# Patient Record
Sex: Female | Born: 1980 | Race: White | Hispanic: No | Marital: Married | Smoking: Never smoker
Health system: Southern US, Community
[De-identification: ages and names within clinical notes are randomized; demographics above are authoritative.]

## PROBLEM LIST (undated history)

## (undated) ENCOUNTER — Inpatient Hospital Stay (HOSPITAL_COMMUNITY): Payer: Self-pay

## (undated) DIAGNOSIS — K449 Diaphragmatic hernia without obstruction or gangrene: Secondary | ICD-10-CM

## (undated) DIAGNOSIS — T7840XA Allergy, unspecified, initial encounter: Secondary | ICD-10-CM

## (undated) DIAGNOSIS — M419 Scoliosis, unspecified: Secondary | ICD-10-CM

## (undated) DIAGNOSIS — IMO0002 Reserved for concepts with insufficient information to code with codable children: Secondary | ICD-10-CM

## (undated) DIAGNOSIS — G43909 Migraine, unspecified, not intractable, without status migrainosus: Secondary | ICD-10-CM

## (undated) DIAGNOSIS — J329 Chronic sinusitis, unspecified: Secondary | ICD-10-CM

## (undated) DIAGNOSIS — J45909 Unspecified asthma, uncomplicated: Secondary | ICD-10-CM

## (undated) DIAGNOSIS — R112 Nausea with vomiting, unspecified: Secondary | ICD-10-CM

## (undated) DIAGNOSIS — K589 Irritable bowel syndrome without diarrhea: Secondary | ICD-10-CM

## (undated) DIAGNOSIS — Z9889 Other specified postprocedural states: Secondary | ICD-10-CM

## (undated) DIAGNOSIS — M1990 Unspecified osteoarthritis, unspecified site: Secondary | ICD-10-CM

## (undated) DIAGNOSIS — K219 Gastro-esophageal reflux disease without esophagitis: Secondary | ICD-10-CM

## (undated) DIAGNOSIS — M329 Systemic lupus erythematosus, unspecified: Secondary | ICD-10-CM

## (undated) DIAGNOSIS — R51 Headache: Secondary | ICD-10-CM

## (undated) DIAGNOSIS — Z9109 Other allergy status, other than to drugs and biological substances: Secondary | ICD-10-CM

## (undated) DIAGNOSIS — F419 Anxiety disorder, unspecified: Secondary | ICD-10-CM

## (undated) HISTORY — PX: TUBAL LIGATION: SHX77

## (undated) HISTORY — DX: Irritable bowel syndrome, unspecified: K58.9

## (undated) HISTORY — DX: Unspecified asthma, uncomplicated: J45.909

## (undated) HISTORY — DX: Diaphragmatic hernia without obstruction or gangrene: K44.9

## (undated) HISTORY — DX: Gastro-esophageal reflux disease without esophagitis: K21.9

## (undated) HISTORY — DX: Unspecified osteoarthritis, unspecified site: M19.90

## (undated) HISTORY — DX: Allergy, unspecified, initial encounter: T78.40XA

## (undated) HISTORY — PX: NASAL SINUS SURGERY: SHX719

## (undated) HISTORY — DX: Anxiety disorder, unspecified: F41.9

---
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
## 2000-09-17 ENCOUNTER — Ambulatory Visit (HOSPITAL_COMMUNITY): Admission: RE | Admit: 2000-09-17 | Discharge: 2000-09-17 | Payer: Self-pay | Admitting: Internal Medicine

## 2001-02-19 ENCOUNTER — Other Ambulatory Visit: Admission: RE | Admit: 2001-02-19 | Discharge: 2001-02-19 | Payer: Self-pay | Admitting: Obstetrics and Gynecology

## 2002-01-01 ENCOUNTER — Emergency Department (HOSPITAL_COMMUNITY): Admission: EM | Admit: 2002-01-01 | Discharge: 2002-01-01 | Payer: Self-pay | Admitting: Emergency Medicine

## 2002-01-02 ENCOUNTER — Encounter: Payer: Self-pay | Admitting: Emergency Medicine

## 2002-04-28 ENCOUNTER — Ambulatory Visit (HOSPITAL_COMMUNITY): Admission: RE | Admit: 2002-04-28 | Discharge: 2002-04-28 | Payer: Self-pay | Admitting: Pulmonary Disease

## 2004-01-09 HISTORY — PX: NASAL SEPTUM SURGERY: SHX37

## 2004-08-29 ENCOUNTER — Emergency Department (HOSPITAL_COMMUNITY): Admission: EM | Admit: 2004-08-29 | Discharge: 2004-08-29 | Payer: Self-pay | Admitting: Family Medicine

## 2004-09-07 ENCOUNTER — Ambulatory Visit (HOSPITAL_COMMUNITY): Admission: RE | Admit: 2004-09-07 | Discharge: 2004-09-07 | Payer: Self-pay | Admitting: Pulmonary Disease

## 2007-03-24 ENCOUNTER — Ambulatory Visit (HOSPITAL_COMMUNITY): Admission: RE | Admit: 2007-03-24 | Discharge: 2007-03-24 | Payer: Self-pay | Admitting: Pulmonary Disease

## 2007-05-15 ENCOUNTER — Ambulatory Visit (HOSPITAL_COMMUNITY): Admission: RE | Admit: 2007-05-15 | Discharge: 2007-05-15 | Payer: Self-pay | Admitting: Pulmonary Disease

## 2007-05-27 ENCOUNTER — Ambulatory Visit (HOSPITAL_COMMUNITY): Admission: RE | Admit: 2007-05-27 | Discharge: 2007-05-27 | Payer: Self-pay | Admitting: Pulmonary Disease

## 2007-09-08 ENCOUNTER — Other Ambulatory Visit: Admission: RE | Admit: 2007-09-08 | Discharge: 2007-09-08 | Payer: Self-pay | Admitting: Obstetrics and Gynecology

## 2008-02-06 ENCOUNTER — Ambulatory Visit: Payer: Self-pay | Admitting: Gastroenterology

## 2008-02-06 DIAGNOSIS — R1013 Epigastric pain: Secondary | ICD-10-CM | POA: Insufficient documentation

## 2008-02-06 DIAGNOSIS — K589 Irritable bowel syndrome without diarrhea: Secondary | ICD-10-CM | POA: Insufficient documentation

## 2008-02-06 DIAGNOSIS — M129 Arthropathy, unspecified: Secondary | ICD-10-CM | POA: Insufficient documentation

## 2008-02-06 DIAGNOSIS — R1033 Periumbilical pain: Secondary | ICD-10-CM | POA: Insufficient documentation

## 2008-02-16 ENCOUNTER — Ambulatory Visit: Payer: Self-pay | Admitting: Gastroenterology

## 2008-02-16 DIAGNOSIS — K219 Gastro-esophageal reflux disease without esophagitis: Secondary | ICD-10-CM | POA: Insufficient documentation

## 2008-02-16 LAB — CONVERTED CEMR LAB: UREASE: NEGATIVE

## 2008-03-18 DIAGNOSIS — K219 Gastro-esophageal reflux disease without esophagitis: Secondary | ICD-10-CM | POA: Insufficient documentation

## 2008-03-18 DIAGNOSIS — K209 Esophagitis, unspecified without bleeding: Secondary | ICD-10-CM | POA: Insufficient documentation

## 2008-03-18 DIAGNOSIS — K449 Diaphragmatic hernia without obstruction or gangrene: Secondary | ICD-10-CM | POA: Insufficient documentation

## 2008-03-18 HISTORY — DX: Esophagitis, unspecified without bleeding: K20.90

## 2008-03-19 ENCOUNTER — Ambulatory Visit: Payer: Self-pay | Admitting: Gastroenterology

## 2008-03-19 LAB — CONVERTED CEMR LAB
AST: 19 units/L (ref 0–37)
Albumin: 3.8 g/dL (ref 3.5–5.2)
Alkaline Phosphatase: 51 units/L (ref 39–117)
BUN: 9 mg/dL (ref 6–23)
Basophils Relative: 0.1 % (ref 0.0–3.0)
Bilirubin, Direct: 0.2 mg/dL (ref 0.0–0.3)
Chloride: 106 meq/L (ref 96–112)
Eosinophils Relative: 7 % — ABNORMAL HIGH (ref 0.0–5.0)
Ferritin: 20.6 ng/mL (ref 10.0–291.0)
Folate: 14.2 ng/mL
GFR calc Af Amer: 153 mL/min
GFR calc non Af Amer: 127 mL/min
HCT: 40.8 % (ref 36.0–46.0)
Hemoglobin: 14.4 g/dL (ref 12.0–15.0)
MCV: 86.3 fL (ref 78.0–100.0)
Monocytes Relative: 5.6 % (ref 3.0–12.0)
Platelets: 223 10*3/uL (ref 150–400)
Potassium: 4.1 meq/L (ref 3.5–5.1)
Saturation Ratios: 31 % (ref 20.0–50.0)
Sed Rate: 9 mm/hr (ref 0–22)
Sodium: 140 meq/L (ref 135–145)
TSH: 1.8 microintl units/mL (ref 0.35–5.50)
Total Bilirubin: 0.9 mg/dL (ref 0.3–1.2)
WBC: 7 10*3/uL (ref 4.5–10.5)

## 2008-05-14 ENCOUNTER — Ambulatory Visit: Payer: Self-pay | Admitting: Gastroenterology

## 2009-03-14 ENCOUNTER — Inpatient Hospital Stay (HOSPITAL_COMMUNITY): Admission: AD | Admit: 2009-03-14 | Discharge: 2009-03-17 | Payer: Self-pay | Admitting: Obstetrics

## 2009-10-05 ENCOUNTER — Emergency Department (HOSPITAL_COMMUNITY): Admission: EM | Admit: 2009-10-05 | Discharge: 2009-10-05 | Payer: Self-pay | Admitting: Emergency Medicine

## 2009-12-27 ENCOUNTER — Ambulatory Visit: Payer: Self-pay | Admitting: Gastroenterology

## 2010-01-11 ENCOUNTER — Telehealth: Payer: Self-pay | Admitting: Gastroenterology

## 2010-01-12 ENCOUNTER — Ambulatory Visit (HOSPITAL_COMMUNITY)
Admission: RE | Admit: 2010-01-12 | Discharge: 2010-01-12 | Payer: Self-pay | Source: Home / Self Care | Attending: Gastroenterology | Admitting: Gastroenterology

## 2010-01-12 LAB — CONVERTED CEMR LAB
BUN: 7 mg/dL (ref 6–23)
Basophils Absolute: 0 10*3/uL (ref 0.0–0.1)
Basophils Relative: 0.6 % (ref 0.0–3.0)
Bilirubin, Direct: 0.1 mg/dL (ref 0.0–0.3)
Creatinine, Ser: 0.7 mg/dL (ref 0.4–1.2)
Eosinophils Absolute: 0.4 10*3/uL (ref 0.0–0.7)
GFR calc non Af Amer: 104 mL/min (ref >60.00)
Iron: 141 ug/dL (ref 42–145)
Lymphocytes Relative: 34.9 % (ref 12.0–46.0)
MCHC: 35.1 g/dL (ref 30.0–36.0)
MCV: 85.2 fL (ref 78.0–100.0)
Monocytes Absolute: 0.6 10*3/uL (ref 0.1–1.0)
Neutro Abs: 3.4 10*3/uL (ref 1.4–7.7)
Neutrophils Relative %: 49.6 % (ref 43.0–77.0)
Potassium: 3.9 meq/L (ref 3.5–5.1)
RBC: 4.47 M/uL (ref 3.87–5.11)
RDW: 13.1 % (ref 11.5–14.6)
Saturation Ratios: 33.1 % (ref 20.0–50.0)
TSH: 1.96 microintl units/mL (ref 0.35–5.50)
Total Bilirubin: 0.5 mg/dL (ref 0.3–1.2)
Transferrin: 304.2 mg/dL (ref 212.0–360.0)
Vitamin B-12: 278 pg/mL (ref 211–911)

## 2010-01-19 ENCOUNTER — Ambulatory Visit: Admit: 2010-01-19 | Payer: Self-pay | Admitting: Gastroenterology

## 2010-02-09 NOTE — Progress Notes (Signed)
Summary: Questions   Phone Note Call from Patient Call back at Home Phone [phone] Call back at [phone]   Caller: Patient Call For: Dr. Jarold Motto Reason for Call: Talk to Nurse Summary of Call: has questions about her procedure at the hospital tomorrow Initial call taken by: [place] Johnson,  January 11, 2010 2:00 PM  Follow-up for Phone Call        Patient is having a GES in am and has had a cold. Patient wanted to know if she can drink water and have cough drops. Informed patient she must be NPO 8hr prior to test and she shouldn't stimulate her stomach with anything until she gets to Nuclear [facility]. She will be given eggs and toast and her stomach will be checked at 1hr and 2 hrs for emptying. She should hold all her stomach meds for 8hr also. Patient stated she may r/s heself if she doesn't feel better. Follow-up by: Graciella Freer RN,  January 11, 2010 2:43 PM

## 2010-02-09 NOTE — Assessment & Plan Note (Signed)
Summary: ACIPHEX NOT HELPING GERD/HERNIA...AS.   History of Present Illness Visit Type: Follow-up Visit Primary GI MD: Sheryn Bison MD FACP FAGA Primary Provider: Kari Baars, MD Requesting Provider: n/a Chief Complaint: Aciphex not helping with GERD, also IBS has wosrened History of Present Illness:   Emma Franco had a normal baby girl 9 months ago. She is not breast-feeding at this time. She continues with acid reflux symptoms despite twice a day AcipHex. She also has early satiety, gas, bloating, and IBS-type complaints partially relieved by Levsin. Review of her chart shows previously borderline positive celiac serologies. Previous exam for H. pylori was negative. She is status post cholecystectomy. She denies abuse of NSAIDs, alcohol, or cigarettes. There is no associated anorexia, weight loss, dysphagia, or hepatobiliary symptomatology.   GI Review of Systems    Reports acid reflux, belching, bloating, heartburn, and  nausea.      Denies abdominal pain, chest pain, dysphagia with liquids, dysphagia with solids, loss of appetite, vomiting, vomiting blood, weight loss, and  weight gain.      Reports change in bowel habits, constipation, diarrhea, and  irritable bowel syndrome.     Denies anal fissure, black tarry stools, diverticulosis, fecal incontinence, heme positive stool, hemorrhoids, jaundice, light color stool, liver problems, rectal bleeding, and  rectal pain.    Current Medications (verified): 1)  Aciphex 20 Mg  Tbec (Rabeprazole Sodium) .... Take One By Mouth Two Times A Day 2)  Levsin 0.125 Mg Tabs (Hyoscyamine Sulfate) .... One Tablet By Mouth Four Times A Day As Needed 3)  Zyrtec Allergy 10 Mg Tabs (Cetirizine Hcl) .Marland Kitchen.. 1 By Mouth Once Daily 4)  Sprintec 28 0.25-35 Mg-Mcg Tabs (Norgestimate-Eth Estradiol) .Marland Kitchen.. 1 By Mouth Once Daily  Allergies (verified): 1)  Cipro  Past History:  Past medical, surgical, family and social histories (including risk factors) reviewed for  relevance to current acute and chronic problems.  Past Medical History: Reviewed history from 03/18/2008 and no changes required. Current Problems:  ESOPHAGITIS (ICD-530.10) HIATAL HERNIA (ICD-553.3) GERD (ICD-530.81) ABDOMINAL PAIN-EPIGASTRIC (ICD-789.06) IRRITABLE BOWEL SYNDROME (ICD-564.1) ARTHRITIS (ICD-716.90)  Past Surgical History: Cholecystectomy Deviated Septum Surgery C-Section 3 /2011  Family History: Reviewed history from 05/14/2008 and no changes required. No FH of Colon Cancer: CVA on both maternal and Paternal  Social History: Reviewed history from 02/06/2008 and no changes required. 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  Alcohol Use - no Daily Caffeine Use Illicit Drug Use - no  Review of Systems       The patient complains of allergy/sinus.  The patient denies anemia, anxiety-new, arthritis/joint pain, back pain, blood in urine, breast changes/lumps, change in vision, confusion, cough, coughing up blood, depression-new, fainting, fatigue, fever, headaches-new, hearing problems, heart murmur, heart rhythm changes, itching, menstrual pain, muscle pains/cramps, night sweats, nosebleeds, pregnancy symptoms, shortness of breath, skin rash, sleeping problems, sore throat, swelling of feet/legs, swollen lymph glands, thirst - excessive , urination - excessive , urination changes/pain, urine leakage, vision changes, and voice change.    Vital Signs:  Patient profile:   30 year old female Height:      66 inches Weight:      178 pounds BMI:     28.83 BSA:     1.91 Pulse rate:   82 / minute Pulse rhythm:   regular BP sitting:   110 / 80  (left arm)  Vitals Entered By: Merri Ray CMA Duncan Dull) (December 27, 2009 11:35 AM)  Physical Exam  General:  Well developed,  well nourished, no acute distress.healthy appearing.   Head:  Normocephalic and atraumatic. Eyes:  PERRLA, no icterus.exam deferred to patient's ophthalmologist.     Lungs:  Clear throughout to auscultation. Heart:  Regular rate and rhythm; no murmurs, rubs,  or bruits. Abdomen:  Soft, nontender and nondistended. No masses, hepatosplenomegaly or hernias noted. Normal bowel sounds. Extremities:  No clubbing, cyanosis, edema or deformities noted. Psych:  Alert and cooperative. Normal mood and affect.   Impression & Recommendations:  Problem # 1:  GERD (ICD-530.81) Assessment Deteriorated problemprobable associated gastroparesis. I have scheduled technetium gastric emptying scan, and she may benefit from domperidone therapy. For now, she is to continue AcipHex twice a day with p.r.n. Levsin. Orders: TLB-CBC Platelet - w/Differential (85025-CBCD) TLB-BMP (Basic Metabolic Panel-BMET) (80048-METABOL) TLB-Hepatic/Liver Function Pnl (80076-HEPATIC) TLB-TSH (Thyroid Stimulating Hormone) (84443-TSH) TLB-B12, Serum-Total ONLY (16109-U04) TLB-Folic Acid (Folate) (82746-FOL) TLB-IBC Pnl (Iron/FE;Transferrin) (83550-IBC) TLB-Ferritin (82728-FER) TLB-Sedimentation Rate (ESR) (85652-ESR) Gastric Emptying Scan (GES)  Problem # 2:  IRRITABLE BOWEL SYNDROME (ICD-564.1) Assessment: Deteriorated Repeat labs and celiac serologies were trial of gluten-free diet.  Patient Instructions: 1)  Your physician requests that you go to the basement floor of our office to have the following labwork completed before leaving today: metabolic panel, hepatic panel, CBC, TSH, IBC, Ferritin, B12 level, Folic Acid, Sedimentation Rate, Sprue Profile. 2)  You have been scheduled for a gastric emptying scan on 01/12/10 @ 8 am [facility]. Please arrive at 7:45 am for registration. 3)  Please pick up your prescriptions at the pharmacy. Electronic prescription(s) has already been sent for Aciphex. 4)  Please continue your Levsin prescription. 5)  The medication list was reviewed and reconciled.  All changed / newly prescribed medications were explained.  A complete medication list  was provided to the patient / caregiver. 6)  Please schedule a follow-up appointment in 1 month.  7)  Copy sent to : Dr. Kari Baars. Prescriptions: ACIPHEX 20 MG  TBEC (RABEPRAZOLE SODIUM) take one by mouth two times a day  #60 x 3   Entered by:   Lamona Curl CMA (AAMA)   Authorized by:   Mardella Layman MD [facility]   Signed by:   Lamona Curl CMA (AAMA) on 12/27/2009   Method used:   Electronically to        [facility]  [address] [phone][facility] (retail)       [address]       [place], [place]  [address]       Ph: [phone]       Fax: [phone]   RxID:   [id number]   Appended Document: ACIPHEX NOT HELPING GERD/HERNIA...AS.    Clinical Lists Changes  Medications: Rx of LEVSIN 0.125 MG TABS (HYOSCYAMINE SULFATE) one tablet by mouth four times a day as needed;  #100 x 3;  Signed;  Entered by: Lamona Curl CMA (AAMA);  Authorized by: Mardella Layman MD [facility];  Method used: [facility]  [id number] [address] [place], [place], [place]  [address], Ph: [phone], Fax: [phone]    Prescriptions: LEVSIN 0.125 MG TABS (HYOSCYAMINE SULFATE) one tablet by mouth four times a day as needed  #100 x 3   Entered by:   Lamona Curl CMA (AAMA)   Authorized by:   Mardella Layman MD [facility]   Signed by:   Lamona Curl CMA (AAMA) on 12/27/2009   Method used:   [facility]  [address] [id number] ([facility]ail)       [address]       [place], [place]  [address]       Ph: [phone]       Fax: [phone]   RxID:   [id number]

## 2010-04-03 LAB — CBC
HCT: 33.7 % — ABNORMAL LOW (ref 36.0–46.0)
Hemoglobin: 8.8 g/dL — ABNORMAL LOW (ref 12.0–15.0)
MCHC: 33.6 g/dL (ref 30.0–36.0)
MCHC: 33.8 g/dL (ref 30.0–36.0)
MCV: 87.2 fL (ref 78.0–100.0)
Platelets: 125 10*3/uL — ABNORMAL LOW (ref 150–400)
Platelets: 180 10*3/uL (ref 150–400)
RDW: 13.5 % (ref 11.5–15.5)
RDW: 13.9 % (ref 11.5–15.5)

## 2010-04-03 LAB — BASIC METABOLIC PANEL
BUN: 4 mg/dL — ABNORMAL LOW (ref 6–23)
Calcium: 7.5 mg/dL — ABNORMAL LOW (ref 8.4–10.5)
Creatinine, Ser: 0.45 mg/dL (ref 0.4–1.2)
GFR calc non Af Amer: >60 mL/min (ref >60)
Glucose, Bld: 118 mg/dL — ABNORMAL HIGH (ref 70–99)

## 2010-07-10 ENCOUNTER — Telehealth: Payer: Self-pay | Admitting: *Deleted

## 2010-07-10 DIAGNOSIS — E538 Deficiency of other specified B group vitamins: Secondary | ICD-10-CM

## 2010-07-10 NOTE — Telephone Encounter (Signed)
Left message on patients machine to call back and give date she can come for b12 level. I have not signed lab order waiting on date to sign.

## 2010-07-10 NOTE — Telephone Encounter (Signed)
Message copied by Leonette Monarch on Mon Jul 10, 2010  3:33 PM ------      Message from: Harlow Mares D      Created: Wed Mar 15, 2010  9:53 AM       Repeat b12 level

## 2010-07-14 NOTE — Telephone Encounter (Signed)
Left another message for patient to call back regarding B12 level.

## 2010-07-17 ENCOUNTER — Other Ambulatory Visit (INDEPENDENT_AMBULATORY_CARE_PROVIDER_SITE_OTHER): Payer: Self-pay

## 2010-07-17 DIAGNOSIS — E538 Deficiency of other specified B group vitamins: Secondary | ICD-10-CM

## 2010-07-17 LAB — VITAMIN B12: Vitamin B-12: 393 pg/mL (ref 211–911)

## 2010-07-17 NOTE — Telephone Encounter (Signed)
Pt aware and she will come today. Order in.

## 2010-08-01 ENCOUNTER — Telehealth: Payer: Self-pay | Admitting: Gastroenterology

## 2010-08-01 NOTE — Telephone Encounter (Signed)
Advised labs are normal

## 2010-08-25 ENCOUNTER — Other Ambulatory Visit: Payer: Self-pay | Admitting: Gastroenterology

## 2011-03-28 ENCOUNTER — Other Ambulatory Visit: Payer: Self-pay | Admitting: Gastroenterology

## 2011-03-28 MED ORDER — RABEPRAZOLE SODIUM 20 MG PO TBEC: 20.0000 mg | DELAYED_RELEASE_TABLET | Freq: Two times a day (BID) | ORAL | Status: DC

## 2011-03-28 NOTE — Telephone Encounter (Signed)
lmom that I ordered 1 month's script of Aciphex.

## 2011-04-17 ENCOUNTER — Other Ambulatory Visit (INDEPENDENT_AMBULATORY_CARE_PROVIDER_SITE_OTHER): Payer: 59

## 2011-04-17 ENCOUNTER — Encounter: Payer: Self-pay | Admitting: Gastroenterology

## 2011-04-17 ENCOUNTER — Ambulatory Visit (INDEPENDENT_AMBULATORY_CARE_PROVIDER_SITE_OTHER): Payer: 59 | Admitting: Gastroenterology

## 2011-04-17 VITALS — BP 112/84 | HR 96 | Ht 66.0 in | Wt 158.1 lb

## 2011-04-17 DIAGNOSIS — K649 Unspecified hemorrhoids: Secondary | ICD-10-CM

## 2011-04-17 DIAGNOSIS — K219 Gastro-esophageal reflux disease without esophagitis: Secondary | ICD-10-CM

## 2011-04-17 DIAGNOSIS — Z8719 Personal history of other diseases of the digestive system: Secondary | ICD-10-CM

## 2011-04-17 DIAGNOSIS — E538 Deficiency of other specified B group vitamins: Secondary | ICD-10-CM

## 2011-04-17 LAB — CBC WITH DIFFERENTIAL/PLATELET
Eosinophils Absolute: 0.7 10*3/uL (ref 0.0–0.7)
Eosinophils Relative: 10.6 % — ABNORMAL HIGH (ref 0.0–5.0)
MCV: 84.5 fl (ref 78.0–100.0)
Monocytes Absolute: 0.8 10*3/uL (ref 0.1–1.0)
Neutrophils Relative %: 46 % (ref 43.0–77.0)
Platelets: 207 10*3/uL (ref 150.0–400.0)
WBC: 7 10*3/uL (ref 4.5–10.5)

## 2011-04-17 LAB — FOLATE

## 2011-04-17 LAB — IBC PANEL: Transferrin: 255.5 mg/dL (ref 212.0–360.0)

## 2011-04-17 LAB — FERRITIN: Ferritin: 27.4 ng/mL (ref 10.0–291.0)

## 2011-04-17 MED ORDER — RABEPRAZOLE SODIUM 20 MG PO TBEC: 20.0000 mg | DELAYED_RELEASE_TABLET | Freq: Two times a day (BID) | ORAL | Status: DC

## 2011-04-17 MED ORDER — PRAMOXINE-HC 1-1 % EX CREA: TOPICAL_CREAM | CUTANEOUS | Status: DC

## 2011-04-17 NOTE — Patient Instructions (Signed)
Please go to the basement today for your labs.  Your prescription(s) have been sent to you pharmacy.   

## 2011-04-17 NOTE — Progress Notes (Signed)
This is a very pleasant 31 year old Caucasian female with chronic acid reflux well controlled with AcipHex 20 mg a day. She currently is asymptomatic and denies chest pain, dysphagia, hepatobiliary or lower gastrointestinal problems. She is status post cholecystectomy. She has a child now age 68, and has had external hemorrhoidal difficulties since her delivery. However, she denies rectal bleeding, history of anemia, but does have a borderline B12 level. Her appetite is good and her weight is stable. She does suffer from allergic sinusitis.  Current Medications, Allergies, Past Medical History, Past Surgical History, Family History and Social History were reviewed in [employer] record.  Pertinent Review of Systems Negative... no history of Raynaud's phenomena more collagen vascular disease. She is currently on birth control pills. She has mild IBS with relieved by when necessary sublingual Levsin.   Physical Exam: Healthy appearing patient appeared her stated age. Blood pressure 112/84 pulse 96 and regular. BMI 25.52 . I cannot appreciate stigmata of chronic liver disease. Abdominal exam shows no organomegaly, masses, tenderness, or abnormal bowel sounds. Mental status is normal.    Assessment and Plan: Chronic GERD doing well on daily PPI therapy. I have renewed her medications and also prescribed when necessary Analpram cream for her hemorrhoidal problems. We did repeat her CBC and anemia profile today. I did review chronic antireflux maneuvers with the patient. We will see her on when necessary basis as needed. We discussed also surgical options with fundoplication for chronic GERD, but I do not think that is necessary at this time. She is to continue medical followup with Dr. Juanetta Gosling as planned. Encounter Diagnosis  Name Primary?  . Esophageal reflux Yes

## 2011-07-10 ENCOUNTER — Other Ambulatory Visit (HOSPITAL_COMMUNITY): Payer: Self-pay | Admitting: Otolaryngology

## 2011-07-11 ENCOUNTER — Encounter (HOSPITAL_COMMUNITY): Payer: Self-pay | Admitting: Pharmacy Technician

## 2011-07-16 ENCOUNTER — Encounter (HOSPITAL_COMMUNITY): Payer: Self-pay

## 2011-07-16 ENCOUNTER — Encounter (HOSPITAL_COMMUNITY)
Admission: RE | Admit: 2011-07-16 | Discharge: 2011-07-16 | Disposition: A | Discharge status: Home / Self Care | Payer: 59 | Source: Ambulatory Visit | Attending: Otolaryngology | Admitting: Otolaryngology

## 2011-07-16 HISTORY — DX: Other specified postprocedural states: Z98.890

## 2011-07-16 HISTORY — DX: Nausea with vomiting, unspecified: R11.2

## 2011-07-16 HISTORY — DX: Other allergy status, other than to drugs and biological substances: Z91.09

## 2011-07-16 HISTORY — DX: Chronic sinusitis, unspecified: J32.9

## 2011-07-16 HISTORY — DX: Headache: R51

## 2011-07-16 LAB — CBC
HCT: 38.1 % (ref 36.0–46.0)
Hemoglobin: 13 g/dL (ref 12.0–15.0)
MCH: 28.4 pg (ref 26.0–34.0)
MCHC: 34.1 g/dL (ref 30.0–36.0)
MCV: 83.2 fL (ref 78.0–100.0)
RBC: 4.58 MIL/uL (ref 3.87–5.11)

## 2011-07-16 NOTE — Pre-Procedure Instructions (Signed)
[name]  07/16/2011   Your procedure is scheduled on:  Wednesday July 18, 2011.  Report to [facility] at 1015 AM.  Call this number if you have problems the morning of surgery: [phone]   Remember:   Do not eat food or drink:After Midnight.    Take these medicines the morning of surgery with A SIP OF WATER: Cetirizine (Zyrtec), Sprintec, and Rabeprazole (Aciphex).   Do not wear jewelry, make-up or nail polish.  Do not wear lotions, powders, or perfumes.   Do not shave 48 hours prior to surgery.   Do not bring valuables to the hospital.  Contacts, dentures or bridgework may not be worn into surgery.  Leave suitcase in the car. After surgery it may be brought to your room.  For patients admitted to the hospital, checkout time is 11:00 AM the day of discharge.   Patients discharged the day of surgery will not be allowed to drive home.  Name and phone number of your driver:   Special Instructions: CHG Shower Use Special Wash: 1/2 bottle night before surgery and 1/2 bottle morning of surgery.   Please read over the following fact sheets that you were given: Pain Booklet, Coughing and Deep Breathing, MRSA Information and Surgical Site Infection Prevention

## 2011-07-18 ENCOUNTER — Ambulatory Visit (HOSPITAL_COMMUNITY): Payer: 59 | Admitting: Anesthesiology

## 2011-07-18 ENCOUNTER — Encounter (HOSPITAL_COMMUNITY): Payer: Self-pay | Admitting: Anesthesiology

## 2011-07-18 ENCOUNTER — Observation Stay (HOSPITAL_COMMUNITY)
Admission: RE | Admit: 2011-07-18 | Discharge: 2011-07-19 | Disposition: A | Discharge status: Home / Self Care | Payer: 59 | Source: Ambulatory Visit | Attending: Otolaryngology | Admitting: Otolaryngology

## 2011-07-18 ENCOUNTER — Encounter (HOSPITAL_COMMUNITY)
Admission: RE | Disposition: A | Discharge status: Home / Self Care | Payer: Self-pay | Source: Ambulatory Visit | Attending: Otolaryngology

## 2011-07-18 ENCOUNTER — Encounter (HOSPITAL_COMMUNITY): Payer: Self-pay | Admitting: General Practice

## 2011-07-18 ENCOUNTER — Encounter (HOSPITAL_COMMUNITY): Payer: Self-pay | Admitting: *Deleted

## 2011-07-18 DIAGNOSIS — Z23 Encounter for immunization: Secondary | ICD-10-CM | POA: Insufficient documentation

## 2011-07-18 DIAGNOSIS — J322 Chronic ethmoidal sinusitis: Principal | ICD-10-CM | POA: Insufficient documentation

## 2011-07-18 DIAGNOSIS — D140 Benign neoplasm of middle ear, nasal cavity and accessory sinuses: Secondary | ICD-10-CM | POA: Insufficient documentation

## 2011-07-18 DIAGNOSIS — K219 Gastro-esophageal reflux disease without esophagitis: Secondary | ICD-10-CM | POA: Insufficient documentation

## 2011-07-18 DIAGNOSIS — J320 Chronic maxillary sinusitis: Secondary | ICD-10-CM | POA: Insufficient documentation

## 2011-07-18 DIAGNOSIS — Z01812 Encounter for preprocedural laboratory examination: Secondary | ICD-10-CM | POA: Insufficient documentation

## 2011-07-18 DIAGNOSIS — J323 Chronic sphenoidal sinusitis: Secondary | ICD-10-CM | POA: Insufficient documentation

## 2011-07-18 DIAGNOSIS — R51 Headache: Secondary | ICD-10-CM

## 2011-07-18 DIAGNOSIS — J321 Chronic frontal sinusitis: Secondary | ICD-10-CM | POA: Insufficient documentation

## 2011-07-18 DIAGNOSIS — G9601 Cranial cerebrospinal fluid leak, spontaneous: Secondary | ICD-10-CM | POA: Insufficient documentation

## 2011-07-18 HISTORY — DX: Migraine, unspecified, not intractable, without status migrainosus: G43.909

## 2011-07-18 HISTORY — PX: SINUS ENDO W/FUSION: SHX777

## 2011-07-18 HISTORY — DX: Headache: R51

## 2011-07-18 HISTORY — PX: REPAIR DURAL / CSF LEAK: SUR1169

## 2011-07-18 SURGERY — SINUS SURGERY, ENDOSCOPIC, USING COMPUTER-ASSISTED NAVIGATION
Anesthesia: General | Site: Nose | Wound class: Clean Contaminated

## 2011-07-18 MED ORDER — HYDROMORPHONE HCL PF 1 MG/ML IJ SOLN
INTRAMUSCULAR | Status: AC
  Filled 2011-07-18: qty 1

## 2011-07-18 MED ORDER — PNEUMOCOCCAL VAC POLYVALENT 25 MCG/0.5ML IJ INJ
0.5000 mL | INJECTION | INTRAMUSCULAR | Status: AC
  Administered 2011-07-19: 0.5 mL via INTRAMUSCULAR
  Filled 2011-07-18: qty 0.5

## 2011-07-18 MED ORDER — HYDROCODONE-ACETAMINOPHEN 5-325 MG PO TABS
1.0000 | ORAL_TABLET | ORAL | Status: DC | PRN
  Administered 2011-07-19: 2 via ORAL
  Filled 2011-07-18 (×2): qty 2

## 2011-07-18 MED ORDER — LIDOCAINE-EPINEPHRINE 1 %-1:100000 IJ SOLN
INTRAMUSCULAR | Status: DC | PRN
  Administered 2011-07-18: 3.5 mL

## 2011-07-18 MED ORDER — DEXAMETHASONE SODIUM PHOSPHATE 4 MG/ML IJ SOLN
INTRAMUSCULAR | Status: DC | PRN
  Administered 2011-07-18: 10 mg via INTRAVENOUS

## 2011-07-18 MED ORDER — BUTALBITAL-APAP-CAFFEINE 50-325-40 MG PO TABS
1.0000 | ORAL_TABLET | Freq: Four times a day (QID) | ORAL | Status: DC | PRN
  Administered 2011-07-18 – 2011-07-19 (×4): 1 via ORAL
  Filled 2011-07-18 (×4): qty 1

## 2011-07-18 MED ORDER — MUPIROCIN 2 % EX OINT
TOPICAL_OINTMENT | CUTANEOUS | Status: AC
  Filled 2011-07-18: qty 22

## 2011-07-18 MED ORDER — CLINDAMYCIN PHOSPHATE 600 MG/50ML IV SOLN: 600.0000 mg | Freq: Once | INTRAVENOUS | Status: DC

## 2011-07-18 MED ORDER — METRONIDAZOLE 500 MG PO TABS
500.0000 mg | ORAL_TABLET | Freq: Three times a day (TID) | ORAL | Status: DC
  Administered 2011-07-18 – 2011-07-19 (×2): 500 mg via ORAL
  Filled 2011-07-18 (×5): qty 1

## 2011-07-18 MED ORDER — CLINDAMYCIN PHOSPHATE 900 MG/50ML IV SOLN
900.0000 mg | INTRAVENOUS | Status: DC
  Filled 2011-07-18: qty 50

## 2011-07-18 MED ORDER — LIDOCAINE-EPINEPHRINE 1 %-1:100000 IJ SOLN
INTRAMUSCULAR | Status: AC
  Filled 2011-07-18: qty 1

## 2011-07-18 MED ORDER — NORGESTIMATE-ETH ESTRADIOL 0.25-35 MG-MCG PO TABS: 1.0000 | ORAL_TABLET | Freq: Every day | ORAL | Status: DC

## 2011-07-18 MED ORDER — GLYCOPYRROLATE 0.2 MG/ML IJ SOLN
INTRAMUSCULAR | Status: DC | PRN
  Administered 2011-07-18: 0.4 mg via INTRAVENOUS

## 2011-07-18 MED ORDER — ONDANSETRON HCL 4 MG/2ML IJ SOLN
INTRAMUSCULAR | Status: DC | PRN
  Administered 2011-07-18: 4 mg via INTRAVENOUS

## 2011-07-18 MED ORDER — MENINGOCOCCAL VAC A,C,Y,W-135 ~~LOC~~ INJ: 0.5000 mL | INJECTION | Freq: Once | SUBCUTANEOUS | Status: DC

## 2011-07-18 MED ORDER — KCL IN DEXTROSE-NACL 10-5-0.45 MEQ/L-%-% IV SOLN
INTRAVENOUS | Status: DC
  Administered 2011-07-18: 75 mL via INTRAVENOUS
  Filled 2011-07-18 (×3): qty 1000

## 2011-07-18 MED ORDER — LIDOCAINE HCL (CARDIAC) 20 MG/ML IV SOLN
INTRAVENOUS | Status: DC | PRN
  Administered 2011-07-18: 60 mg via INTRAVENOUS

## 2011-07-18 MED ORDER — LACTATED RINGERS IV SOLN
INTRAVENOUS | Status: DC
  Administered 2011-07-18: 11:00:00 via INTRAVENOUS

## 2011-07-18 MED ORDER — MORPHINE SULFATE 2 MG/ML IJ SOLN
1.0000 mg | INTRAMUSCULAR | Status: DC | PRN
  Administered 2011-07-18 – 2011-07-19 (×2): 1 mg via INTRAVENOUS
  Filled 2011-07-18 (×2): qty 1

## 2011-07-18 MED ORDER — PANTOPRAZOLE SODIUM 40 MG PO TBEC
40.0000 mg | DELAYED_RELEASE_TABLET | Freq: Every day | ORAL | Status: DC
  Administered 2011-07-19: 40 mg via ORAL
  Filled 2011-07-18: qty 1

## 2011-07-18 MED ORDER — ACETAMINOPHEN 80 MG PO CHEW: 320.0000 mg | CHEWABLE_TABLET | ORAL | Status: DC | PRN

## 2011-07-18 MED ORDER — LACTATED RINGERS IV SOLN
INTRAVENOUS | Status: DC | PRN
  Administered 2011-07-18 (×3): via INTRAVENOUS

## 2011-07-18 MED ORDER — CLINDAMYCIN PHOSPHATE 600 MG/50ML IV SOLN
INTRAVENOUS | Status: DC | PRN
  Administered 2011-07-18: 600 mg via INTRAVENOUS

## 2011-07-18 MED ORDER — DOCUSATE SODIUM 100 MG PO CAPS
100.0000 mg | ORAL_CAPSULE | Freq: Two times a day (BID) | ORAL | Status: DC | PRN
Start: 2011-07-18 — End: 2011-07-19

## 2011-07-18 MED ORDER — MICROFIBRILLAR COLL HEMOSTAT EX PADS
MEDICATED_PAD | CUTANEOUS | Status: DC | PRN
  Administered 2011-07-18: 1 via TOPICAL

## 2011-07-18 MED ORDER — DROPERIDOL 2.5 MG/ML IJ SOLN
INTRAMUSCULAR | Status: AC
  Filled 2011-07-18: qty 2

## 2011-07-18 MED ORDER — MIDAZOLAM HCL 5 MG/5ML IJ SOLN
INTRAMUSCULAR | Status: DC | PRN
  Administered 2011-07-18 (×2): 1 mg via INTRAVENOUS

## 2011-07-18 MED ORDER — NEOSTIGMINE METHYLSULFATE 1 MG/ML IJ SOLN
INTRAMUSCULAR | Status: DC | PRN
  Administered 2011-07-18: 2 mg via INTRAVENOUS

## 2011-07-18 MED ORDER — OXYMETAZOLINE HCL 0.05 % NA SOLN
NASAL | Status: AC
  Filled 2011-07-18: qty 15

## 2011-07-18 MED ORDER — MUPIROCIN CALCIUM 2 % EX CREA
TOPICAL_CREAM | CUTANEOUS | Status: AC
  Filled 2011-07-18: qty 15

## 2011-07-18 MED ORDER — ONDANSETRON HCL 4 MG/2ML IJ SOLN
4.0000 mg | Freq: Once | INTRAMUSCULAR | Status: AC | PRN
  Administered 2011-07-18: 4 mg via INTRAVENOUS

## 2011-07-18 MED ORDER — FENTANYL CITRATE 0.05 MG/ML IJ SOLN
INTRAMUSCULAR | Status: DC | PRN
  Administered 2011-07-18 (×5): 50 ug via INTRAVENOUS
  Administered 2011-07-18: 150 ug via INTRAVENOUS

## 2011-07-18 MED ORDER — FLUORESCEIN SODIUM 1 MG OP STRP
1.0000 | ORAL_STRIP | OPHTHALMIC | Status: AC
  Filled 2011-07-18 (×2): qty 1

## 2011-07-18 MED ORDER — ROCURONIUM BROMIDE 100 MG/10ML IV SOLN
INTRAVENOUS | Status: DC | PRN
  Administered 2011-07-18: 15 mg via INTRAVENOUS
  Administered 2011-07-18: 35 mg via INTRAVENOUS
  Administered 2011-07-18: 20 mg via INTRAVENOUS

## 2011-07-18 MED ORDER — ACETAMINOPHEN 325 MG PO TABS: 325.0000 mg | ORAL_TABLET | ORAL | Status: DC | PRN

## 2011-07-18 MED ORDER — MUPIROCIN CALCIUM 2 % NA OINT
TOPICAL_OINTMENT | NASAL | Status: DC | PRN
  Administered 2011-07-18: 1 via NASAL

## 2011-07-18 MED ORDER — PROPOFOL 10 MG/ML IV EMUL
INTRAVENOUS | Status: DC | PRN
  Administered 2011-07-18: 50 mg via INTRAVENOUS
  Administered 2011-07-18: 200 mg via INTRAVENOUS

## 2011-07-18 MED ORDER — HYDROMORPHONE HCL PF 1 MG/ML IJ SOLN
0.2500 mg | INTRAMUSCULAR | Status: DC | PRN
  Administered 2011-07-18 (×5): 0.5 mg via INTRAVENOUS

## 2011-07-18 MED ORDER — FLUORESCEIN SODIUM 1 MG OP STRP
ORAL_STRIP | OPHTHALMIC | Status: DC | PRN
  Administered 2011-07-18 (×4): 1

## 2011-07-18 MED ORDER — SODIUM CHLORIDE 0.9 % IR SOLN
Status: DC | PRN
  Administered 2011-07-18: 1000 mL

## 2011-07-18 MED ORDER — ONDANSETRON HCL 4 MG/2ML IJ SOLN
INTRAMUSCULAR | Status: AC
  Administered 2011-07-18: 4 mg via INTRAVENOUS
  Filled 2011-07-18: qty 2

## 2011-07-18 MED ORDER — PROMETHAZINE HCL 25 MG/ML IJ SOLN
25.0000 mg | INTRAMUSCULAR | Status: DC | PRN
  Administered 2011-07-18 – 2011-07-19 (×2): 25 mg via INTRAVENOUS
  Filled 2011-07-18 (×2): qty 1

## 2011-07-18 MED ORDER — CLINDAMYCIN HCL 150 MG PO CAPS
150.0000 mg | ORAL_CAPSULE | Freq: Four times a day (QID) | ORAL | Status: DC
  Administered 2011-07-18 – 2011-07-19 (×3): 150 mg via ORAL
  Filled 2011-07-18 (×7): qty 1

## 2011-07-18 MED ORDER — DROPERIDOL 2.5 MG/ML IJ SOLN
0.6250 mg | Freq: Once | INTRAMUSCULAR | Status: DC
  Administered 2011-07-18: 0.625 mg via INTRAVENOUS

## 2011-07-18 MED ORDER — FLUORESCEIN SODIUM 1 MG OP STRP
1.0000 | ORAL_STRIP | OPHTHALMIC | Status: DC
  Filled 2011-07-18: qty 1

## 2011-07-18 MED ORDER — ONDANSETRON HCL 4 MG PO TABS: 4.0000 mg | ORAL_TABLET | Freq: Four times a day (QID) | ORAL | Status: DC | PRN

## 2011-07-18 MED ORDER — LORATADINE 10 MG PO TABS
10.0000 mg | ORAL_TABLET | Freq: Every day | ORAL | Status: DC
  Administered 2011-07-19: 10 mg via ORAL
  Filled 2011-07-18: qty 1

## 2011-07-18 MED ORDER — PNEUMOCOCCAL VAC POLYVALENT 25 MCG/0.5ML IJ INJ
0.5000 mL | INJECTION | Freq: Once | INTRAMUSCULAR | Status: DC
  Filled 2011-07-18: qty 0.5

## 2011-07-18 MED ORDER — OXYMETAZOLINE HCL 0.05 % NA SOLN
NASAL | Status: DC | PRN
  Administered 2011-07-18: 1

## 2011-07-18 MED ORDER — ONDANSETRON HCL 4 MG/2ML IJ SOLN
4.0000 mg | Freq: Four times a day (QID) | INTRAMUSCULAR | Status: DC | PRN
  Administered 2011-07-19: 4 mg via INTRAVENOUS
  Filled 2011-07-18: qty 2

## 2011-07-18 MED ORDER — MENINGOCOCCAL A C Y&W-135 CONJ IM INJ
0.5000 mL | INJECTION | Freq: Once | INTRAMUSCULAR | Status: AC
  Administered 2011-07-19: 0.5 mL via INTRAMUSCULAR
  Filled 2011-07-18: qty 0.5

## 2011-07-18 MED ORDER — HYDROMORPHONE HCL PF 1 MG/ML IJ SOLN
INTRAMUSCULAR | Status: AC
  Filled 2011-07-18: qty 2

## 2011-07-18 SURGICAL SUPPLY — 49 items
BENZOIN TINCTURE PRP APPL 2/3 (GAUZE/BANDAGES/DRESSINGS) ×2 IMPLANT
BLADE RAD60 ROTATE M4 4 5PK (BLADE) ×2 IMPLANT
BLADE ROTATE TRICUT 4X13 M4 (BLADE) ×2 IMPLANT
CANISTER SUCTION 2500CC (MISCELLANEOUS) ×2 IMPLANT
CLOTH BEACON ORANGE TIMEOUT ST (SAFETY) ×2 IMPLANT
CLSR STERI-STRIP ANTIMIC 1/2X4 (GAUZE/BANDAGES/DRESSINGS) ×2 IMPLANT
COAGULATOR SUCT SWTCH 10FR 6 (ELECTROSURGICAL) ×2 IMPLANT
CORDS BIPOLAR (ELECTRODE) ×2 IMPLANT
CRADLE DONUT ADULT HEAD (MISCELLANEOUS) ×2 IMPLANT
DRESSING NASAL POPE 10X1.5X2.5 (GAUZE/BANDAGES/DRESSINGS) ×1 IMPLANT
DRSG NASAL POPE 10X1.5X2.5 (GAUZE/BANDAGES/DRESSINGS) ×2
DURASEAL SPINE SEALANT 3ML (MISCELLANEOUS) ×2 IMPLANT
ELECT REM PT RETURN 9FT ADLT (ELECTROSURGICAL) ×2
ELECTRODE REM PT RTRN 9FT ADLT (ELECTROSURGICAL) ×1 IMPLANT
GLOVE BIOGEL M 6.5 STRL (GLOVE) ×4 IMPLANT
GLOVE BIOGEL PI IND STRL 6.5 (GLOVE) ×3 IMPLANT
GLOVE BIOGEL PI IND STRL 7.0 (GLOVE) ×1 IMPLANT
GLOVE BIOGEL PI INDICATOR 6.5 (GLOVE) ×3
GLOVE BIOGEL PI INDICATOR 7.0 (GLOVE) ×1
GLOVE SURG SS PI 6.5 STRL IVOR (GLOVE) ×2 IMPLANT
GLOVE SURG SS PI 7.0 STRL IVOR (GLOVE) ×2 IMPLANT
GLOVE SURG SS PI 7.5 STRL IVOR (GLOVE) ×2 IMPLANT
GOWN STRL NON-REIN LRG LVL3 (GOWN DISPOSABLE) ×10 IMPLANT
KIT BASIN OR (CUSTOM PROCEDURE TRAY) ×2 IMPLANT
KIT ROOM TURNOVER OR (KITS) ×2 IMPLANT
MARKER SKIN DUAL TIP RULER LAB (MISCELLANEOUS) ×4 IMPLANT
NEEDLE HYPO 25GX1X1/2 BEV (NEEDLE) ×2 IMPLANT
NEEDLE SPNL 25GX3.5 QUINCKE BL (NEEDLE) ×2 IMPLANT
NS IRRIG 1000ML POUR BTL (IV SOLUTION) ×2 IMPLANT
PAD ARMBOARD 7.5X6 YLW CONV (MISCELLANEOUS) ×4 IMPLANT
PAD ENT ADHESIVE 25PK (MISCELLANEOUS) ×2 IMPLANT
PATTIES SURGICAL .5 X3 (DISPOSABLE) ×2 IMPLANT
SEALANT DURASEAL SPINE 3ML (Neuro Prosthesis/Implant) ×2 IMPLANT
SOLUTION ANTI FOG 6CC (MISCELLANEOUS) ×2 IMPLANT
SPECIMEN JAR SMALL (MISCELLANEOUS) ×4 IMPLANT
SUT ETHILON 3 0 PS 1 (SUTURE) IMPLANT
SUT SILK 2 0 FS (SUTURE) ×4 IMPLANT
SWAB COLLECTION DEVICE MRSA (MISCELLANEOUS) ×2 IMPLANT
SYR 50ML SLIP (SYRINGE) IMPLANT
SYRINGE 10CC LL (SYRINGE) ×2 IMPLANT
TOWEL OR 17X24 6PK STRL BLUE (TOWEL DISPOSABLE) ×2 IMPLANT
TOWEL OR 17X26 10 PK STRL BLUE (TOWEL DISPOSABLE) ×2 IMPLANT
TRACKER ENT INSTRUMENT (MISCELLANEOUS) ×4 IMPLANT
TRACKER ENT PATIENT (MISCELLANEOUS) ×4 IMPLANT
TRAY ENT MC OR (CUSTOM PROCEDURE TRAY) ×2 IMPLANT
TUBE CONNECTING 12X1/4 (SUCTIONS) ×2 IMPLANT
TUBING STRAIGHTSHOT EPS 5PK (TUBING) ×2 IMPLANT
WATER STERILE IRR 1000ML POUR (IV SOLUTION) ×2 IMPLANT
WIPE INSTRUMENT VISIWIPE 73X73 (MISCELLANEOUS) ×2 IMPLANT

## 2011-07-18 NOTE — Anesthesia Preprocedure Evaluation (Addendum)
Anesthesia Evaluation  Patient identified by MRN, date of birth, ID band Patient awake    Reviewed: Allergy & Precautions, H&P , NPO status , Patient's Chart, lab work & pertinent test results  History of Anesthesia Complications (+) PONV  Airway Mallampati: I TM Distance: >3 FB Neck ROM: full    Dental  (+) Teeth Intact   Pulmonary  breath sounds clear to auscultation        Cardiovascular Rhythm:regular Rate:Normal     Neuro/Psych  Headaches,  Neuromuscular disease    GI/Hepatic hiatal hernia, GERD-  Medicated,  Endo/Other    Renal/GU      Musculoskeletal   Abdominal (+)  Abdomen: soft.    Peds  Hematology   Anesthesia Other Findings   Reproductive/Obstetrics                         Anesthesia Physical Anesthesia Plan  ASA: I  Anesthesia Plan: General   Post-op Pain Management:    Induction: Intravenous  Airway Management Planned: Oral ETT  Additional Equipment:   Intra-op Plan:   Post-operative Plan: Extubation in OR  Informed Consent: I have reviewed the patients History and Physical, chart, labs and discussed the procedure including the risks, benefits and alternatives for the proposed anesthesia with the patient or authorized representative who has indicated his/her understanding and acceptance.     Plan Discussed with: CRNA, Anesthesiologist and Surgeon  Anesthesia Plan Comments:         Anesthesia Quick Evaluation

## 2011-07-18 NOTE — H&P (Signed)
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  She now presents for revision bilateral functional endoscopic sinus surgery and repair of left ethmoid CSF leak.  Dr. Emeline Darling, [place] has discussed the risks, including meningitis, orbital or skull base injury, recurrence of the CSF leak or sinusitis and need for revision surgery, bleeding, the benefits, and alternatives of this procedure. The patient understands the risks and would like to proceed with the procedure. The chances of success of the procedure are >50% and the patient understands this. I personally performed an examination of the patient within 24 hours of the procedure.  PAST MEDICAL HISTORY: Past Medical History  Diagnosis Date  . Esophageal reflux   . Hiatal hernia   . Irritable bowel syndrome   . Arthritis   . PONV (postoperative nausea and vomiting)   . Sinusitis, chronic   . Environmental allergies   . Headache      PAST SURGICAL HISTORY: Past Surgical History  Procedure Date  . Cholecystectomy   . Cesarean section 03/2009  . Nasal septum surgery   . Nasal sinus surgery 03/2010  . Tonsillectomy     MEDICATIONS: No current facility-administered medications on file prior to encounter.   Current Outpatient Prescriptions on File Prior to Encounter  Medication Sig Dispense Refill  . cetirizine (ZYRTEC) 10 MG tablet Take 10 mg by mouth daily.      . norgestimate-ethinyl estradiol (SPRINTEC 28) 0.25-35 MG-MCG tablet Take 1 tablet by mouth daily.      . RABEprazole (ACIPHEX) 20 MG tablet Take 1 tablet (20 mg total) by mouth 2 (two) times daily.  60 tablet  11    ALLERGIES: Allergies  Allergen Reactions  . Ciprofloxacin     REACTION: Nausea/Vomiting     SOCIAL HISTORY: History   Social History  . Marital Status: Married    Spouse Name: N/A    Number of Children: 1  . Years of Education: N/A   Occupational History  . CASEWORKER [employer]   Social History Main Topics  . Smoking status: Never Smoker   . Smokeless tobacco: Never Used  . Alcohol Use: No  . Drug Use: No  . Sexually Active: Yes    Birth Control/ Protection: Pill   Other Topics Concern  . Not on file   Social History Narrative  . No narrative on file    FAMILY HISTORY: Family History  Problem Relation Age of Onset  . Stroke Maternal Grandmother   . Stroke Paternal Grandmother   . Rheum arthritis Mother   . Multiple sclerosis Mother     REVIEW OF SYSTEMS:  HEENT:left CSF rhinorrhea, chronic sinusitis, dizziness, otherwise negative x 10 systems except per HPI   PHYSICAL EXAM:  GENERAL:  Awake, alert VITAL SIGNS:   Filed Vitals:   07/18/11 1043  BP: 125/86  Pulse: 81  Temp: 98.1 F (36.7 C)  Resp: 20   SKIN:  Warm, dry HEENT:  No epistaxis, oral cavity clear NECK:  supple LYMPH:  No LAD LUNGS:  Grossly clear CARDIOVASCULAR:  RRR ABDOMEN:  Soft, nontender MUSCULOSKELETAL: normal strength PSYCH:  Alert and oriented NEUROLOGIC:  CN 2-12 grossly intact and symmetric  DIAGNOSTIC STUDIES: CT sinuses shows diffuse Lund-McKay grade I mucosal thickening bilaterally  ASSESSMENT AND PLAN: Plan to  proceed with revision bilateral endoscopic sinus surgery with repair of left ethmoid CSF leak. Patient understands the risks, benefits, and alternatives. Will monitor overnight post-op. 07/18/11 11:13 AM Emma Franco

## 2011-07-18 NOTE — Anesthesia Postprocedure Evaluation (Signed)
Anesthesia Post Note  Patient: Emma Franco  Procedure(s) Performed: Procedure(s) (LRB): ENDOSCOPIC SINUS SURGERY WITH FUSION NAVIGATION (N/A)  Anesthesia type: general  Patient location: PACU  Post pain: Still has headache and nausea, Dr Emeline Darling aware  Post assessment: Patient's Cardiovascular Status Stable  Last Vitals:  Filed Vitals:   07/18/11 1745  BP: 108/67  Pulse: 97  Temp:   Resp: 22    Post vital signs: Reviewed and stable  Level of consciousness: awake  Complications: No apparent anesthesia complications

## 2011-07-18 NOTE — Progress Notes (Signed)
Spoke with Dr. Emeline Darling twice about his patient and updated him on her continued nausea, pain and headache.  Orders received.  Spoke with Dr. Michelle Piper with anesthesia twice about patients pain and nausea, orders received for extra dose of dilaudid and droperidol for nausea.

## 2011-07-18 NOTE — Progress Notes (Signed)
07/18/2011 6:20 PM  Emma Franco [id number]  Post op check    Temp:  [97 F (36.1 C)-98.1 F (36.7 C)] 98.1 F (36.7 C) (07/10 1715) Pulse Rate:  [79-97] 97  (07/10 1745) Resp:  [10-22] 22  (07/10 1745) BP: (104-133)/(65-113) 108/67 mmHg (07/10 1745) SpO2:  [94 %-100 %] 98 % (07/10 1745),     Intake/Output Summary (Last 24 hours) at 07/18/11 1820 Last data filed at 07/18/11 1624  Gross per 24 hour  Intake   2400 ml  Output   1500 ml  Net    900 ml    No results found for this or any previous visit (from the past 24 hour(s)).  SUBJECTIVE:  Just transferred from PACU.  Asleep, but readily aroused.  Mod HA. No N,V.  No SOB.  OBJECTIVE:  Color, energy good.  Min nasal drainage.  Dried blood in mouth.  Packs secure  IMPRESSION:  Satisfactory check  PLAN:  Limited activity.  Oral hygiene.  Analgesia.  Pack removal per Dr. Laneta Simmers, Zola Button

## 2011-07-18 NOTE — Anesthesia Procedure Notes (Signed)
Procedure Name: Intubation Date/Time: 07/18/2011 12:33 PM Performed by: Ellin Goodie Pre-anesthesia Checklist: Patient identified, Emergency Drugs available, Suction available, Patient being monitored and Timeout performed Patient Re-evaluated:Patient Re-evaluated prior to inductionOxygen Delivery Method: Circle system utilized Preoxygenation: Pre-oxygenation with 100% oxygen Intubation Type: IV induction Ventilation: Mask ventilation without difficulty Laryngoscope Size: Mac and 3 Grade View: Grade I Tube type: Oral Tube size: 7.5 mm Number of attempts: 1 Airway Equipment and Method: Stylet and LTA kit utilized Placement Confirmation: ETT inserted through vocal cords under direct vision,  positive ETCO2 and breath sounds checked- equal and bilateral Secured at: 22 cm Tube secured with: Tape Dental Injury: Teeth and Oropharynx as per pre-operative assessment

## 2011-07-18 NOTE — Op Note (Signed)
07/18/11 5:03 PM Surgeon: Melvenia Beam Procedure Performed: 72536-64 nasal endoscopy with bilateral repair of ethmoid cerebrospinal fluid leak 31267-50 revision bilateral maxillary antrostomy with tissue removal 31255-50 revision bilateral total ethmoidectomy 31288-50 bilateral sphenoidotomies with tissue removal 31276-50 revision bilateral Draf III frontal sinusotomies 61782-CT image guidance  PREOPERATIVE DIAGNOSIS: 1. 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 2. chronic maxillary sinusitis with polyposis 3. Chronic frontal sinusitis with polyposis 4. Chronic sphenoid sinusitis with polyposis 5. Left cribriform plate/ethmoid roof CSF leak  POSTOPERATIVE DIAGNOSIS: 1. 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 2. chronic maxillary sinusitis with polyposis 3. Chronic frontal sinusitis with polyposis 4. Chronic sphenoid sinusitis with polyposis 5. Bilateral cribriform plate/ethmoid roof CSF leak    ANESTHESIA: General endotracheal.  ESTIMATED BLOOD LOSS: Approximately 100 mL.  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  OPERATIVE FINDINGS: sinonasal polyposis and chronic sinusitis. No orbital injury. Bilateral ethmoid roof/cribriform plate Fluorescein-positive CSF leak repaired with a bilateral middle turbinate mucosal graft, surgicel, and Duraseal. No vascular injury.  Drains: BL 5cm merocele fingercots secured to the patient's cheeks.  Specimens: right ethmoid culture, right and left sinus contents  PROCEDURE: The patient was brought to the operating room and placed in the supine position.  After adequate endotracheal anesthesia was obtained, the patient was draped in sterile fashion. Lidocaine 1% with 1:100,000 epinephrine was injected into the bilateral greater palatine foramina and the bilateral sphenopalatine foramina endoscopically. The nose was decongested with topical Afrin pledgets which were then removed. The medtronic Fusion hardware was placed in the patient's forehead and she was registered to the image guidance CT with surface matching registration with good accuracy and precision.  I started with the 0 degree scope on the left side.The left middle turbinate and residual left uncinate process were identified. I removed the residual left uncinate using the debrider and backbiter. The previous maxillary antrostomy was identified and noted to be very edematous and scarred with narrowing of the maxillary antrostomy. I used the 45 degree scope and 60 degree debrider to remove the dense maxillary polyposis and expanded the left maxillary antrostomy with the back-biting forceps and straight Thru-Cut. I then widely opened the natural ostium using the 90 degree Blakesley.  Next the left anterior and posterior ethmoid air cells were entered. Mucosal edema and ethmoid polyposis was noted, and this was removed using the straight and 60 degree debriders all the way to the skull base and out to the lamina papyracea.   Next the left sphenoid natural ostium was identified using the image guidance suction. I opened the left sphenoid anterior wall with the debrider and then the 3mm Kerrison up to the skull base and laterally to the lamina papyracea. I removed polyposis blocking the sphenoid ostium using the straight debrider.  Next I switched to the 45 degree scope and 60 degree debrider. The previous frontal sinusotomy showed some polypoid edema around the frontal recess which I removed using the debrider and image guidance suction. After removal of the polyposis the left frontal sinusotomy was  widely patent.  Next I turned to the right side with the 0 degree scope.The right middle turbinate and residual uncinate process were identified. I removed the residual right uncinate using  the debrider and backbiter. The previous maxillary antrostomy was identified and noted to be patent but moderately edematous and scarred with narrowing of the maxillary antrostomy. I used the 45 degree scope and 60 degree debrider to remove the maxillary polyposis and expanded the right maxillary antrostomy with the back-biting forceps and straight Thru-Cut. I then widely opened the natural ostium using the 90 degree Blakesley.  Next the right anterior and posterior ethmoid air cells were entered. Mucosal edema and dense, obstructive right ethmoid polyposis was noted, and this was removed using the straight and 60 degree debriders all the way to the skull base and out to the lamina papyracea. I took a culture of some thick, discolored mucous from the right ethmoid cells.  Next the right sphenoid natural ostium was identified using the image guidance suction. I opened the right sphenoid anterior wall with the debrider and then the 3mm Kerrison up to the skull base and laterally to the lamina papyracea. I removed polyposis blocking the sphenoid ostium using the straight debrider.  Next I switched to the 45 degree scope and 60 degree debrider. The previous right frontal sinusotomy showed some polypoid edema around the frontal recess which I removed using the debrider and image guidance suction. After removal of the polyposis the right frontal sinusotomy was widely patent.  Next I turned to repair of the CSF leak. I placed bilateral Fluorescein topical strips and noted bilateral orange to green color change and pooling of CSF coming from the cribriform plate and ethmoid roof bilaterally. I then made a superior septectomy and converted the Draf IIA frontal sinusotomies to a Draf III frontal sinusotomy by opening the  septectomy anterior to and inferior to the cribriform plate and connecting the bilateral frontal sinuses. I removed the middle turbinates bilaterally to expose the ethmoid roof and cribriform plate. Once the Draf III frontal sinusotomies had been completed, I traced the CSF extravasation back to the root/superior attachments of the middle turbinates bilaterally. I cauterized the areas of leakage and ethmoid roof/cribriform plate mucosa as needed, and gently removed the ethmoid roof mucosa to expose the bare ethmoid roof bone. I then removed the bilateral middle turbinate mucosa, and gently place the middle turbinate mucosa, with the mucosal side facing out and marked blue, and the periosteum down on the ethmoid roof bone. I then bolstered the bilateral middle turbinate mucosal graft repair with surgicel, then Duraseal. I then placed bilateral 5cm fingercot/merocele packs to Bolster the repair that were taped to the patient's cheeks bilaterally. The nasal cavity and stomach were suctioned out and the image guidance hardware was removed.  The middle turbinate pedicles were cauterized with the suction Bovie. The patient was then awakened from anesthesia and extubated without difficulty. The patient tolerated the procedure well and returned to the recovery room in stable condition.   Dr. Melvenia Beam was present and performed the entire procedure. 07/18/11 4:52 PM Melvenia Beam

## 2011-07-18 NOTE — Transfer of Care (Signed)
Immediate Anesthesia Transfer of Care Note  Patient: Emma Franco  Procedure(s) Performed: Procedure(s) (LRB): ENDOSCOPIC SINUS SURGERY WITH FUSION NAVIGATION (N/A)  Patient Location: PACU  Anesthesia Type: General  Level of Consciousness: sedated  Airway & Oxygen Therapy: Patient Spontanous Breathing and Patient connected to face mask oxygen  Post-op Assessment: Report given to PACU RN and Post -op Vital signs reviewed and stable  Post vital signs: stable  Complications: No apparent anesthesia complications

## 2011-07-19 ENCOUNTER — Encounter (HOSPITAL_COMMUNITY): Payer: Self-pay | Admitting: Otolaryngology

## 2011-07-19 LAB — GRAM STAIN

## 2011-07-19 MED ORDER — CHLORHEXIDINE GLUCONATE 0.12 % MT SOLN
15.0000 mL | Freq: Two times a day (BID) | OROMUCOSAL | Status: DC
  Administered 2011-07-19: 15 mL via OROMUCOSAL
  Filled 2011-07-19: qty 15

## 2011-07-19 MED ORDER — BIOTENE DRY MOUTH MT LIQD
15.0000 mL | Freq: Two times a day (BID) | OROMUCOSAL | Status: DC
  Administered 2011-07-19: 15 mL via OROMUCOSAL

## 2011-07-19 NOTE — Progress Notes (Signed)
Patient ID: Emma Franco, female   DOB: 10-20-1980, 31 y.o.   MRN: [id number] Pt. Discharged 07/19/2011  3:54 PM Discharge instructions reviewed with patient/family. Patient/family verbalized understanding. All Rx's given. Questions answered as needed. Pt. Discharged to home with family/self.  Lurline Idol [facility]

## 2011-07-19 NOTE — Discharge Summary (Signed)
07/19/11 3:34 PM  Date of Admission:07/18/11 Date of Discharge:07/19/11  Discharge RU:EAVW, Clovis Riley  Admitting UJ:WJXB, Clovis Riley  Reason for admission/final discharge diagnosis: bilateral ethmoid roof/cribriform plate CSF leak and sinonasal polyposis with chronic bilateral sinusitis  Labs: right ethmoid culture pending  Procedure(s) performed: bilateral revision endoscopic sinus surgery and bilateral ethmoid CSF leak repair with middle turbinate grafts 07/18/11  Discharge Condition: good  Discharge Exam: no rhinorrhea or epistaxis, nasal packs in place. CN 2-12 intact and symmetric. Vision intact OU, PERRLA, EOMI OU.  Discharge Instructions: No heavy lifting, no straining, leave packs in place. follow up with  Dr. Emeline Darling At [facility] on 07/27/11.  Hospital Course: underwent bilateral revision sinus surgery and endoscopic repair of a bilateral cribriform/ethmoid roof CSF leak. She did well post-op other than some nausea and headache that responded to vicodin/phenergan and was ready to be discharged on POD#1 in good condition.  Melvenia Beam 3:34 PM 07/19/11

## 2011-07-19 NOTE — Progress Notes (Signed)
Subjective: POD#1 from revision bilateral endoscopic sinus surgery and bilateral repair of ethmoid/cribriform plate CSF leak. Had some significant headache and nausea post-op, improved with Vicodin and phenergan. Feeling a little better this morning. Had some emesis yesterday.   Objective: Vital signs in last 24 hours: Temp:  [97 F (36.1 C)-98.1 F (36.7 C)] 97.7 F (36.5 C) (07/11 0519) Pulse Rate:  [79-102] 84  (07/11 0519) Resp:  [10-22] 18  (07/11 0519) BP: (104-133)/(65-113) 108/73 mmHg (07/11 0519) SpO2:  [93 %-100 %] 98 % (07/11 0519)  Awake, alert, visual fields intact and symmetric OU, EOMI, PERRLA. Nasal meroceles in place, no epistaxis or rhinorrhea. CN 2-12 intact and symmetric. Voice normal  @LABLAST2 (wbc:2,hgb:2,hct:2,plt:2) No results found for this basename: NA:2,K:2,CL:2,CO2:2,GLUCOSE:2,BUN:2,CREATININE:2,CALCIUM:2 in the last 72 hours  Medications:  No current facility-administered medications on file prior to encounter.   Current Outpatient Prescriptions on File Prior to Encounter  Medication Sig Dispense Refill  . cetirizine (ZYRTEC) 10 MG tablet Take 10 mg by mouth daily.      . norgestimate-ethinyl estradiol (SPRINTEC 28) 0.25-35 MG-MCG tablet Take 1 tablet by mouth daily.      . RABEprazole (ACIPHEX) 20 MG tablet Take 1 tablet (20 mg total) by mouth 2 (two) times daily.  60 tablet  11   Scheduled Meds:   . antiseptic oral rinse  15 mL Mouth Rinse q12n4p  . chlorhexidine  15 mL Mouth Rinse BID  . clindamycin  150 mg Oral Q6H  . droperidol      . HYDROmorphone      . HYDROmorphone      . loratadine  10 mg Oral Daily  . meningococcal polysaccharide  0.5 mL Intramuscular Once  . metroNIDAZOLE  500 mg Oral Q8H  . norgestimate-ethinyl estradiol  1 tablet Oral Daily  . ondansetron      . pantoprazole  40 mg Oral Q1200  . pneumococcal 23 valent vaccine  0.5 mL Intramuscular Tomorrow-1000  . DISCONTD: clindamycin (CLEOCIN) IV  600 mg Intravenous Once  .  DISCONTD: clindamycin (CLEOCIN) IV  900 mg Intravenous To OR  . DISCONTD: droperidol  0.625 mg Intravenous Once  . DISCONTD: fluorescein  1 strip Both Eyes To OR  . DISCONTD: meningococcal vaccine  0.5 mL Subcutaneous Once  . DISCONTD: pneumococcal 23 valent vaccine  0.5 mL Intramuscular Once   Continuous Infusions:   . dextrose 5 % and 0.45 % NaCl with KCl 10 mEq/L 75 mL (07/18/11 2034)  . lactated ringers 50 mL/hr at 07/18/11 1127   PRN Meds:.acetaminophen, butalbital-acetaminophen-caffeine, docusate sodium, HYDROcodone-acetaminophen, morphine injection, ondansetron (ZOFRAN) IV, ondansetron (ZOFRAN) IV, ondansetron, promethazine, DISCONTD: acetaminophen, DISCONTD: fluorescein, DISCONTD:  HYDROmorphone (DILAUDID) injection, DISCONTD: lidocaine-EPINEPHrine, DISCONTD: microfibrllar collagen, DISCONTD: mupirocin nasal ointment, DISCONTD: oxymetazoline, DISCONTD: sodium chloride irrigation  Assessment/Plan: Doing well post-op day 1 from revision FESS and bilateral CSF leak repair. Continue to monitor, no nose blowing, no strenuous activity. Continue PRN vicodin and phenergan and prophylactic clindamycin and metronidazole. Ordered pneumovax and menigococcal vaccine as well. Will remove packing POD#10 to allow middle turbinate grafts to heal.   LOS: 1 day   Melvenia Beam 07/19/2011, 7:37 AM

## 2011-07-20 LAB — CULTURE, ROUTINE-SINUS

## 2011-12-19 ENCOUNTER — Emergency Department (HOSPITAL_COMMUNITY)
Admission: EM | Admit: 2011-12-19 | Discharge: 2011-12-19 | Disposition: A | Discharge status: Home / Self Care | Payer: 59 | Source: Home / Self Care

## 2011-12-19 ENCOUNTER — Encounter (HOSPITAL_COMMUNITY): Payer: Self-pay | Admitting: Emergency Medicine

## 2011-12-19 DIAGNOSIS — J020 Streptococcal pharyngitis: Secondary | ICD-10-CM

## 2011-12-19 LAB — POCT RAPID STREP A: Streptococcus, Group A Screen (Direct): POSITIVE — AB

## 2011-12-19 MED ORDER — AMOXICILLIN 500 MG PO CAPS: 500.0000 mg | ORAL_CAPSULE | Freq: Three times a day (TID) | ORAL | Status: DC

## 2011-12-19 NOTE — ED Notes (Signed)
Sore throat for 2 weeks, also reports intermittent headache.  Reports cough started today.  Reports having a sinus infection prior to 2 weeks ago.  Seen by dr Dolphus Jenny and treated with bactrim.  Denies fever

## 2011-12-19 NOTE — ED Provider Notes (Signed)
Medical screening examination/treatment/procedure(s) were performed by non-physician practitioner and as supervising physician I was immediately available for consultation/collaboration.  Leslee Home, M.D.   Reuben Likes, MD 12/19/11 2024

## 2011-12-19 NOTE — ED Provider Notes (Signed)
History     CSN: [id number]  Arrival date & time 12/19/11  1612   None     Chief Complaint  Patient presents with  . Sore Throat    (Consider location/radiation/quality/duration/timing/severity/associated sxs/prior treatment) HPI Comments: 31 year old female presents with a sore throat for 2 weeks. This got worse in the past couple days. Is associated with PND and headache. She denies fever or GI, GU symptoms.   Past Medical History  Diagnosis Date  . Esophageal reflux   . Hiatal hernia   . Irritable bowel syndrome   . PONV (postoperative nausea and vomiting)   . Sinusitis, chronic   . Environmental allergies   . Headache 07/18/11    "qd for the last year; CSF leak; repaired today"  . Migraines     "once in a blue moon"  . Arthritis     "in my back"    Past Surgical History  Procedure Date  . Cesarean section 03/2009  . Nasal sinus surgery 03/2010; 07/18/11  . Nasal septum surgery 2006  . Repair dural / csf leak 07/18/11  . Tonsillectomy 1990  . Cholecystectomy 12/1996  . Sinus endo w/fusion 07/18/2011    Procedure: ENDOSCOPIC SINUS SURGERY WITH FUSION NAVIGATION;  Surgeon: Melvenia Beam, MD;  Location: [facility] OR;  Service: ENT;  Laterality: N/A;    Family History  Problem Relation Age of Onset  . Stroke Maternal Grandmother   . Stroke Paternal Grandmother   . Rheum arthritis Mother   . Multiple sclerosis Mother     History  Substance Use Topics  . Smoking status: Never Smoker   . Smokeless tobacco: Never Used  . Alcohol Use: No    OB History    Grav Para Term Preterm Abortions TAB SAB Ect Mult Living                  Review of Systems  Constitutional: Negative for fever, chills, activity change, appetite change and fatigue.  HENT: Positive for congestion, sore throat and postnasal drip. Negative for facial swelling, rhinorrhea, mouth sores, neck pain and neck stiffness.   Eyes: Negative.   Respiratory: Negative.   Cardiovascular: Negative.    Gastrointestinal: Negative.   Musculoskeletal: Negative.   Skin: Negative for pallor and rash.  Neurological: Negative.     Allergies  Ciprofloxacin  Home Medications   Current Outpatient Rx  Name  Route  Sig  Dispense  Refill  . FLUTICASONE PROPIONATE 50 MCG/ACT NA SUSP   Nasal   Place 2 sprays into the nose daily.         . AMOXICILLIN 500 MG PO CAPS   Oral   Take 1 capsule (500 mg total) by mouth 3 (three) times daily.   21 capsule   0   . CETIRIZINE HCL 10 MG PO TABS   Oral   Take 10 mg by mouth daily.         Marland Kitchen NORGESTIMATE-ETH ESTRADIOL 0.25-35 MG-MCG PO TABS   Oral   Take 1 tablet by mouth daily.         Marland Kitchen RABEPRAZOLE SODIUM 20 MG PO TBEC   Oral   Take 1 tablet (20 mg total) by mouth 2 (two) times daily.   60 tablet   11     BP 109/77  Pulse 92  Temp 99 F (37.2 C) (Oral)  Resp 18  SpO2 99%  LMP 11/24/2011  Physical Exam  Constitutional: She is oriented to person, place, and time. She appears well-developed  and well-nourished. No distress.  HENT:  Mouth/Throat: No oropharyngeal exudate.       Bilateral TMs normal. OP with mild erythema, no exudates or swelling  Eyes: EOM are normal. Pupils are equal, round, and reactive to light.  Neck: Normal range of motion. Neck supple.  Cardiovascular: Normal rate and regular rhythm.   Pulmonary/Chest: Effort normal and breath sounds normal. No respiratory distress. She has no wheezes. She has no rales.  Musculoskeletal: Normal range of motion. She exhibits no edema.  Lymphadenopathy:    She has cervical adenopathy.  Neurological: She is alert and oriented to person, place, and time.  Skin: Skin is warm and dry. No rash noted.  Psychiatric: She has a normal mood and affect.    ED Course  Procedures (including critical care time)  Labs Reviewed  POCT RAPID STREP A ([facility]RG CARE ONLY) - Abnormal; Notable for the following:    Streptococcus, Group A Screen (Direct) POSITIVE (*)     All other  components within normal limits   No results found.   1. Strep pharyngitis       MDM  Amoxicillin 500 3 times a day for 7 day Cepacol lozenges Ibuprofen 600 mg every 6 hours when necessary Plenty of fluids stay well hydrated       Hayden Rasmussen, NP 12/19/11 1850

## 2012-04-30 ENCOUNTER — Other Ambulatory Visit: Payer: Self-pay | Admitting: Gastroenterology

## 2012-04-30 NOTE — Telephone Encounter (Signed)
PATIENT WILL NEED OFFICE VISIT FOR FURTHER REFILLS  

## 2012-08-20 ENCOUNTER — Other Ambulatory Visit: Payer: Self-pay | Admitting: Gastroenterology

## 2012-08-20 ENCOUNTER — Other Ambulatory Visit: Payer: Self-pay

## 2012-08-20 ENCOUNTER — Telehealth: Payer: Self-pay | Admitting: *Deleted

## 2012-08-20 MED ORDER — RABEPRAZOLE SODIUM 20 MG PO TBEC: 20.0000 mg | DELAYED_RELEASE_TABLET | Freq: Two times a day (BID) | ORAL | Status: DC

## 2012-08-20 NOTE — Telephone Encounter (Signed)
Patient called and wanted to know why her Aciphex was denied I advised patient that Dr. Jarold Motto likes a follow up visit every year Patient made an appointment and RX sent

## 2012-09-09 ENCOUNTER — Ambulatory Visit (INDEPENDENT_AMBULATORY_CARE_PROVIDER_SITE_OTHER): Payer: 59 | Admitting: Gastroenterology

## 2012-09-09 ENCOUNTER — Encounter: Payer: Self-pay | Admitting: Gastroenterology

## 2012-09-09 VITALS — BP 102/68 | HR 72 | Ht 66.0 in | Wt 159.4 lb

## 2012-09-09 DIAGNOSIS — K219 Gastro-esophageal reflux disease without esophagitis: Secondary | ICD-10-CM

## 2012-09-09 MED ORDER — RABEPRAZOLE SODIUM 20 MG PO TBEC: 20.0000 mg | DELAYED_RELEASE_TABLET | Freq: Two times a day (BID) | ORAL | Status: DC

## 2012-09-09 NOTE — Patient Instructions (Signed)
New prescription for Aciphex was sent to your pharmacy   Please follow up in one year

## 2012-09-09 NOTE — Progress Notes (Signed)
This is a year-old Caucasian female who has had many years of acid reflux confirmed by previous endoscopy several years ago and showed a small hiatal hernia and erosive esophagitis.  She's been on AcipHex 20 mg one to 2 times a day for many years, and denies current symptoms unless she misses a dose of her medication.  She specifically denies dysphagia, chest pain, or any other GI or general medical problems.  She had complications from sinus surgery with what was apparently a spinal cord leakage, but denies current use and she her general medical problems.  She relates she has yearly blood work done in her gynecologist is unremarkable.  Review of labs and chart shows no evidence of significant anemia or liver function test abnormality.  Current Medications, Allergies, Past Medical History, Past Surgical History, Family History and Social History were reviewed in [employer] record.  ROS: All systems were reviewed and are negative unless otherwise stated in the HPI.          Physical Exam: Blood pressure 102/68, pulse 72 and regular and weight 159 with a BMI of 25.74.  I cannot appreciate stigmata of chronic liver disease.  Chest is clear and she is in a regular rhythm without murmurs gallops or rubs.  There is no organomegaly, abdominal masses or tenderness.  Bowel sounds are normal.  Mental status is normal.    Assessment and Plan: Chronic GERD doing well on daily PPI therapy.  I reviewed antireflux regime with this patient and have renewed her AcipHex and have asked to check with [place] on a yearly basis.  I see no need for endoscopy at this point in time.  We will see her on a when necessary basis otherwise as needed.

## 2012-10-06 ENCOUNTER — Emergency Department (HOSPITAL_COMMUNITY)
Admission: EM | Admit: 2012-10-06 | Discharge: 2012-10-06 | Disposition: A | Discharge status: Home / Self Care | Payer: 59 | Source: Home / Self Care | Attending: Family Medicine | Admitting: Family Medicine

## 2012-10-06 ENCOUNTER — Emergency Department (INDEPENDENT_AMBULATORY_CARE_PROVIDER_SITE_OTHER): Payer: 59

## 2012-10-06 ENCOUNTER — Encounter (HOSPITAL_COMMUNITY): Payer: Self-pay | Admitting: Emergency Medicine

## 2012-10-06 DIAGNOSIS — S139XXA Sprain of joints and ligaments of unspecified parts of neck, initial encounter: Secondary | ICD-10-CM

## 2012-10-06 DIAGNOSIS — S161XXA Strain of muscle, fascia and tendon at neck level, initial encounter: Secondary | ICD-10-CM

## 2012-10-06 MED ORDER — CYCLOBENZAPRINE HCL 10 MG PO TABS: 10.0000 mg | ORAL_TABLET | Freq: Two times a day (BID) | ORAL | Status: DC | PRN

## 2012-10-06 MED ORDER — IBUPROFEN 600 MG PO TABS: 600.0000 mg | ORAL_TABLET | Freq: Three times a day (TID) | ORAL | Status: DC | PRN

## 2012-10-06 MED ORDER — TRAMADOL HCL 50 MG PO TABS: 50.0000 mg | ORAL_TABLET | Freq: Four times a day (QID) | ORAL | Status: DC | PRN

## 2012-10-06 NOTE — ED Notes (Signed)
Delay in triage secondary to nurse assignment

## 2012-10-06 NOTE — ED Provider Notes (Signed)
CSN: [id number]     Arrival date & time 10/06/12  0807 History   First MD Initiated Contact with Patient 10/06/12 [phone]     Chief Complaint  Patient presents with  . Neck Pain   (Consider location/radiation/quality/duration/timing/severity/associated sxs/prior Treatment) HPI Comments: 32 year old female here complaining of neck pain for 2 days. Patient was in a musical concert and had a individual falling from the upper sitting area landing on her upper back and neck. Patient reported she did not loss consciousness and was able to continue with her planned activities for the rest of her day with minimal discomfort. States that the second day after her she started with increased neck discomfort worse with head movements. Pain is slightly better today but persistent and disruptive. Patient denies numbness, weakness or paresthesias in her upper extremities. Has not taken any medications for her symptoms.    Past Medical History  Diagnosis Date  . Esophageal reflux   . Hiatal hernia   . Irritable bowel syndrome   . PONV (postoperative nausea and vomiting)   . Sinusitis, chronic   . Environmental allergies   . Headache(784.0) 07/18/11    "qd for the last year; CSF leak; repaired today"  . Migraines     "once in a blue moon"  . Arthritis     "in my back"   Past Surgical History  Procedure Laterality Date  . Cesarean section  03/2009  . Nasal sinus surgery  03/2010; 07/18/11  . Nasal septum surgery  2006  . Repair dural / csf leak  07/18/11  . Tonsillectomy  1990  . Cholecystectomy  12/1996  . Sinus endo w/fusion  07/18/2011    Procedure: ENDOSCOPIC SINUS SURGERY WITH FUSION NAVIGATION;  Surgeon: Melvenia Beam, MD;  Location: [facility] OR;  Service: ENT;  Laterality: N/A;   Family History  Problem Relation Age of Onset  . Stroke Maternal Grandmother   . Stroke Paternal Grandmother   . Rheum arthritis Mother   . Multiple sclerosis Mother    History  Substance Use Topics  . Smoking status:  Never Smoker   . Smokeless tobacco: Never Used  . Alcohol Use: Yes   OB History   Grav Para Term Preterm Abortions TAB SAB Ect Mult Living                 Review of Systems  Constitutional: Negative for fever and chills.  HENT: Positive for neck pain. Negative for congestion, sore throat and trouble swallowing.   Eyes: Negative for visual disturbance.  Respiratory: Negative for cough and shortness of breath.   Cardiovascular: Negative for chest pain.  Skin: Negative for rash.       No bruising  Neurological: Negative for dizziness and headaches.    Allergies  Ciprofloxacin  Home Medications   Current Outpatient Rx  Name  Route  Sig  Dispense  Refill  . azelastine (ASTELIN) 137 MCG/SPRAY nasal spray   Nasal   Place 2 sprays into the nose 2 (two) times daily. Use in each nostril as directed         . cyclobenzaprine (FLEXERIL) 10 MG tablet   Oral   Take 1 tablet (10 mg total) by mouth 2 (two) times daily as needed for muscle spasms.   20 tablet   0   . fluticasone (FLONASE) 50 MCG/ACT nasal spray   Nasal   Place 2 sprays into the nose daily.         Marland Kitchen ibuprofen (ADVIL,MOTRIN) 600 MG tablet  Oral   Take 1 tablet (600 mg total) by mouth every 8 (eight) hours as needed for pain. Take with meals   30 tablet   0   . levocetirizine (XYZAL) 5 MG tablet   Oral   Take 5 mg by mouth every evening.         . RABEprazole (ACIPHEX) 20 MG tablet   Oral   Take 1 tablet (20 mg total) by mouth 2 (two) times daily.   60 tablet   11   . traMADol (ULTRAM) 50 MG tablet   Oral   Take 1 tablet (50 mg total) by mouth every 6 (six) hours as needed for pain.   15 tablet   0    BP 141/88  Pulse 108  Temp(Src) 98.5 F (36.9 C)  Resp 20  SpO2 98%  LMP 09/16/2012 Physical Exam  Nursing note and vitals reviewed. Constitutional: She is oriented to person, place, and time. She appears well-developed and well-nourished. No distress.  HENT:  Head: Normocephalic and  atraumatic.  Right Ear: External ear normal.  Left Ear: External ear normal.  Mouth/Throat: Oropharynx is clear and moist. No oropharyngeal exudate.  Eyes: Conjunctivae and EOM are normal. Pupils are equal, round, and reactive to light.  Neck: No tracheal deviation present. No thyromegaly present.  Cervical spine central: No obvious deformity. No pain over bone processes.  Fair range of motion despite reported pain. Able to touch chest with chin and extend with minimal discomfort. Pain worse with bilateral rotation. Increased tone and tenderness to palpation over sternocleidomastoid muscles bilaterally. Negative Spurling test.   Lymphadenopathy:    She has no cervical adenopathy.  Neurological: She is alert and oriented to person, place, and time. She has normal strength and normal reflexes. No cranial nerve deficit or sensory deficit.  Skin: No rash noted. She is not diaphoretic.    ED Course  Procedures (including critical care time) Labs Review Labs Reviewed  POCT PREGNANCY, URINE   Imaging Review No results found.  MDM   1. Neck strain, initial encounter    Negative point-of-care pregnancy test. Cervical spine x-rays results pending at the time of discharge. We will contact patient if abnormal results. Impress muscle strain. Prescribed tramadol, ibuprofen and Flexeril. Supportive care including rehabilitation exercises and red flags that should prompt her return to medical attention discussed with patient and provided in writing.  Sharin Grave, MD 10/06/12 [phone]

## 2012-10-06 NOTE — ED Notes (Signed)
Neck pain onset Saturday night 9/27.  Patient reports being at a concert.  Reports someone fell from upper arena area, landing on patients neck and head.  Patient was sitting at the time.  Patient reports neck pain is better today, but continues to hurt

## 2013-01-08 HISTORY — DX: Maternal care for unspecified type scar from previous cesarean delivery: O34.219

## 2013-06-18 DIAGNOSIS — O352XX0 Maternal care for (suspected) hereditary disease in fetus, not applicable or unspecified: Secondary | ICD-10-CM | POA: Insufficient documentation

## 2013-09-11 ENCOUNTER — Encounter (HOSPITAL_COMMUNITY): Payer: Self-pay

## 2013-09-11 ENCOUNTER — Inpatient Hospital Stay (HOSPITAL_COMMUNITY)
Admission: AD | Admit: 2013-09-11 | Discharge: 2013-09-11 | Disposition: A | Discharge status: Home / Self Care | Payer: 59 | Source: Ambulatory Visit | Attending: Obstetrics & Gynecology | Admitting: Obstetrics & Gynecology

## 2013-09-11 DIAGNOSIS — O9989 Other specified diseases and conditions complicating pregnancy, childbirth and the puerperium: Principal | ICD-10-CM

## 2013-09-11 DIAGNOSIS — E860 Dehydration: Secondary | ICD-10-CM | POA: Insufficient documentation

## 2013-09-11 DIAGNOSIS — K529 Noninfective gastroenteritis and colitis, unspecified: Secondary | ICD-10-CM | POA: Diagnosis present

## 2013-09-11 DIAGNOSIS — O99891 Other specified diseases and conditions complicating pregnancy: Secondary | ICD-10-CM | POA: Insufficient documentation

## 2013-09-11 DIAGNOSIS — K5289 Other specified noninfective gastroenteritis and colitis: Secondary | ICD-10-CM | POA: Diagnosis not present

## 2013-09-11 DIAGNOSIS — O212 Late vomiting of pregnancy: Secondary | ICD-10-CM | POA: Diagnosis present

## 2013-09-11 LAB — URINALYSIS, ROUTINE W REFLEX MICROSCOPIC
Glucose, UA: NEGATIVE mg/dL
Hgb urine dipstick: NEGATIVE
Ketones, ur: >80 mg/dL — AB
Nitrite: NEGATIVE
Protein, ur: NEGATIVE mg/dL
Specific Gravity, Urine: <1.005 — ABNORMAL LOW (ref 1.005–1.030)
Urobilinogen, UA: 1 mg/dL (ref 0.0–1.0)
pH: 6 (ref 5.0–8.0)

## 2013-09-11 LAB — CBC
HCT: 34.7 % — ABNORMAL LOW (ref 36.0–46.0)
Hemoglobin: 12.3 g/dL (ref 12.0–15.0)
MCH: 30.2 pg (ref 26.0–34.0)
MCHC: 35.4 g/dL (ref 30.0–36.0)
MCV: 85.3 fL (ref 78.0–100.0)
Platelets: 195 10*3/uL (ref 150–400)
RBC: 4.07 MIL/uL (ref 3.87–5.11)
RDW: 13.5 % (ref 11.5–15.5)
WBC: 11.2 10*3/uL — ABNORMAL HIGH (ref 4.0–10.5)

## 2013-09-11 LAB — COMPREHENSIVE METABOLIC PANEL
ALT: 23 U/L (ref 0–35)
AST: 31 U/L (ref 0–37)
Albumin: 2.7 g/dL — ABNORMAL LOW (ref 3.5–5.2)
Alkaline Phosphatase: 131 U/L — ABNORMAL HIGH (ref 39–117)
Anion gap: 15 (ref 5–15)
BUN: 4 mg/dL — ABNORMAL LOW (ref 6–23)
CO2: 23 mEq/L (ref 19–32)
Calcium: 8.6 mg/dL (ref 8.4–10.5)
Chloride: 98 mEq/L (ref 96–112)
Creatinine, Ser: 0.43 mg/dL — ABNORMAL LOW (ref 0.50–1.10)
GFR calc Af Amer: >90 mL/min (ref >90)
GFR calc non Af Amer: >90 mL/min (ref >90)
Glucose, Bld: 82 mg/dL (ref 70–99)
Potassium: 3 mEq/L — ABNORMAL LOW (ref 3.7–5.3)
Sodium: 136 mEq/L — ABNORMAL LOW (ref 137–147)
Total Bilirubin: 0.9 mg/dL (ref 0.3–1.2)
Total Protein: 6.4 g/dL (ref 6.0–8.3)

## 2013-09-11 LAB — URINE MICROSCOPIC-ADD ON

## 2013-09-11 MED ORDER — LACTATED RINGERS IV SOLN
INTRAVENOUS | Status: DC
  Administered 2013-09-11: 17:00:00 via INTRAVENOUS

## 2013-09-11 MED ORDER — LACTATED RINGERS IV BOLUS (SEPSIS)
1000.0000 mL | Freq: Once | INTRAVENOUS | Status: AC
  Administered 2013-09-11: 1000 mL via INTRAVENOUS

## 2013-09-11 MED ORDER — LACTATED RINGERS IV BOLUS (SEPSIS)
300.0000 mL | Freq: Once | INTRAVENOUS | Status: AC
  Administered 2013-09-11: 300 mL via INTRAVENOUS

## 2013-09-11 MED ORDER — DEXTROSE IN LACTATED RINGERS 5 % IV SOLN
Freq: Once | INTRAVENOUS | Status: AC
  Administered 2013-09-11: 500 mL via INTRAVENOUS

## 2013-09-11 MED ORDER — ONDANSETRON 8 MG/NS 50 ML IVPB
8.0000 mg | Freq: Once | INTRAVENOUS | Status: AC
  Administered 2013-09-11: 8 mg via INTRAVENOUS
  Filled 2013-09-11: qty 8

## 2013-09-11 NOTE — MAU Note (Signed)
Patient states she had an intestinal virus on 8-31 and 9-1 with vomiting and diarrhea. States she started feeling bad again last night. One episode of diarrhea and vomiting once today. No feeling well. Having some mild abdominal pain, no bleeding or leaking. Reports good fetal movement.

## 2013-09-11 NOTE — MAU Provider Note (Signed)
  History     CSN: [id number]  Arrival date and time: 09/11/13 1502 Provider here to evaluate @ 1610    Chief Complaint  Patient presents with  . Nausea   HPI  Sent from office for IVF hydration and antiemetic medication GI virus all week  Past Medical History  Diagnosis Date  . Esophageal reflux   . Hiatal hernia   . Irritable bowel syndrome   . PONV (postoperative nausea and vomiting)   . Sinusitis, chronic   . Environmental allergies   . Headache(784.0) 07/18/11    "qd for the last year; CSF leak; repaired today"  . Migraines     "once in a blue moon"  . Arthritis     "in my back"    Past Surgical History  Procedure Laterality Date  . Cesarean section  03/2009  . Nasal sinus surgery  03/2010; 07/18/11  . Nasal septum surgery  2006  . Repair dural / csf leak  07/18/11  . Tonsillectomy  1990  . Cholecystectomy  12/1996  . Sinus endo w/fusion  07/18/2011    Procedure: ENDOSCOPIC SINUS SURGERY WITH FUSION NAVIGATION;  Surgeon: Ruby Cola, MD;  Location: [facility] OR;  Service: ENT;  Laterality: N/A;    Family History  Problem Relation Age of Onset  . Stroke Maternal Grandmother   . Stroke Paternal Grandmother   . Rheum arthritis Mother   . Multiple sclerosis Mother     History  Substance Use Topics  . Smoking status: Never Smoker   . Smokeless tobacco: Never Used  . Alcohol Use: No    Allergies:  Allergies  Allergen Reactions  . Ciprofloxacin Nausea And Vomiting    Prescriptions prior to admission  Medication Sig Dispense Refill  . fluticasone (FLONASE) 50 MCG/ACT nasal spray Place 2 sprays into the nose daily.      Marland Kitchen levocetirizine (XYZAL) 5 MG tablet Take 5 mg by mouth every evening.      . Prenatal Vit-Fe Fumarate-FA (PRENATAL MULTIVITAMIN) TABS tablet Take 1 tablet by mouth daily at 12 noon.      . promethazine (PHENERGAN) 12.5 MG suppository Place 12.5 mg rectally every 6 (six) hours as needed for nausea or vomiting.      . RABEprazole (ACIPHEX) 20 MG  tablet Take 20 mg by mouth daily.      . [DISCONTINUED] RABEprazole (ACIPHEX) 20 MG tablet Take 1 tablet (20 mg total) by mouth 2 (two) times daily.  60 tablet  11    ROS Physical Exam   Blood pressure 117/85, pulse 107, temperature 98.1 F (36.7 C), temperature source Oral, resp. rate 16, height 5\' 6"  (1.676 m), weight 77.565 kg (171 lb), SpO2 97.00%.  Physical Exam Alert and oriented Lungs clear Abdomen soft / non-tender / + bowel sounds FHR category 1 MAU Course  Procedures  IV hydration Assessment and Plan  33.4 weeks gastroenteritis Dehydration  1) IVF hydration 2) zofran IV x single dose 3) DC home - supportive care / rest over weekend  Artelia Laroche 09/11/2013, 4:15 PM

## 2013-09-28 ENCOUNTER — Other Ambulatory Visit: Payer: Self-pay | Admitting: Obstetrics

## 2013-10-07 ENCOUNTER — Encounter (HOSPITAL_COMMUNITY): Payer: Self-pay | Admitting: Pharmacist

## 2013-10-14 NOTE — Patient Instructions (Signed)
Your procedure is scheduled on:  Friday, Oct. 9, 2015  Enter through the [employer] [facility] at:  12noon  Pick up the phone at the desk and dial [phone].  Call this number if you have problems the morning of surgery: [phone].  Remember: Do NOT eat food: After midnight tonight Do NOT drink clear liquids after: After 9:30 a.m. Friday morning Take these medicines the morning of surgery with a SIP OF WATER: ACIPHEX  Do NOT wear jewelry (body piercing), metal hair clips/bobby pins, or nail polish. Do NOT wear lotions, powders, or perfumes.  You may wear deoderant. Do NOT shave for 48 hours prior to surgery. Do NOT bring valuables to the hospital. Leave suitcase in car.  After surgery it may be brought to your room.  For patients admitted to the hospital, checkout time is 11:00 AM the day of discharge.

## 2013-10-15 ENCOUNTER — Encounter (HOSPITAL_COMMUNITY)
Admission: RE | Admit: 2013-10-15 | Discharge: 2013-10-15 | Disposition: A | Discharge status: Home / Self Care | Payer: 59 | Source: Ambulatory Visit | Attending: Obstetrics | Admitting: Obstetrics

## 2013-10-15 ENCOUNTER — Encounter (HOSPITAL_COMMUNITY): Payer: Self-pay

## 2013-10-15 HISTORY — DX: Scoliosis, unspecified: M41.9

## 2013-10-15 LAB — CBC
HCT: 34.9 % — ABNORMAL LOW (ref 36.0–46.0)
HEMOGLOBIN: 12.3 g/dL (ref 12.0–15.0)
MCH: 29.9 pg (ref 26.0–34.0)
MCHC: 35.2 g/dL (ref 30.0–36.0)
MCV: 84.9 fL (ref 78.0–100.0)
PLATELETS: 165 10*3/uL (ref 150–400)
RBC: 4.11 MIL/uL (ref 3.87–5.11)
RDW: 13.4 % (ref 11.5–15.5)
WBC: 11.7 10*3/uL — AB (ref 4.0–10.5)

## 2013-10-15 LAB — OB RESULTS CONSOLE ABO/RH: RH Type: POSITIVE

## 2013-10-15 LAB — TYPE AND SCREEN
ABO/RH(D): B POS
Antibody Screen: NEGATIVE

## 2013-10-15 LAB — RPR

## 2013-10-15 LAB — ABO/RH: ABO/RH(D): B POS

## 2013-10-15 NOTE — H&P (Addendum)
Emma Franco is a 33 y.o. G3P1011 at [redacted]w[redacted]d presenting for RCS. Pt notes no contractions . Good fetal movement, No vaginal bleeding, not leaking fluid. Pt w/ prior c/s, interim sonohysterogram revealed thinned lower segment with fluid tracking from endometrial canal into myometrium at the level of c/s scar. Given increased risk for uterine rupture, pt has been advised on early c/s.  [facility] at [facility] since 6 wks - daughter w/ ASD, nl fetal echo in this preg - Need for RCS due to thinned lower segment as above. PCS for arrest of descent - Frequent sinusitis, nasal polyps. Sinus surgery led to CSF leak, now s/p bone graft but pt remains of prophylactic nasal steroids and abx. Several rounds on po abx this preg due to recurrent infections. No further CSF leak - GERD - GBS neg   Prenatal Transfer Tool  Maternal Diabetes: No Genetic Screening: Normal Maternal Ultrasounds/Referrals: Normal Fetal Ultrasounds or other Referrals:  Fetal echo Maternal Substance Abuse:  No Significant Maternal Medications:  None Significant Maternal Lab Results: None     OB History   Grav Para Term Preterm Abortions TAB SAB Ect Mult Living   3 1 1  1  1   1      Past Medical History  Diagnosis Date  . Esophageal reflux   . Hiatal hernia   . Irritable bowel syndrome   . PONV (postoperative nausea and vomiting)   . Sinusitis, chronic   . Environmental allergies   . Headache(784.0) 07/18/11    "qd for the last year; CSF leak; repaired today"  . Migraines     "once in a blue moon"  . Arthritis     "in my back"  . Scoliosis     mild   Past Surgical History  Procedure Laterality Date  . Cesarean section  03/2009  . Nasal sinus surgery  03/2010; 07/18/11  . Nasal septum surgery  2006  . Repair dural / csf leak  07/18/11  . Tonsillectomy  1990  . Cholecystectomy  12/1996  . Sinus endo w/fusion  07/18/2011    Procedure: ENDOSCOPIC SINUS SURGERY WITH FUSION NAVIGATION;  Surgeon: Ruby Cola,  MD;  Location: [facility] OR;  Service: ENT;  Laterality: N/A;   Family History: family history includes Multiple sclerosis in her mother; Rheum arthritis in her mother; Stroke in her maternal grandmother and paternal grandmother. Social History:  reports that she has never smoked. She has never used smokeless tobacco. She reports that she does not drink alcohol or use illicit drugs.  Review of Systems - Negative except discomfort of preg     Filed Vitals:   10/16/13 1157  BP: 123/86  Pulse: 101  Temp: 98.1 F (36.7 C)  TempSrc: Oral  Resp: 18  SpO2: 100%    Physical Exam:  Gen: well appearing, no distress  Back: no CVAT Abd: gravid, NT, no RUQ pain LE: no edema, equal bilaterally, non-tender   Prenatal labs: ABO, Rh: --/--/B POS, B POS (10/08 1120) Antibody: NEG (10/08 1120) Rubella:   immune RPR: NON REAC (10/08 1120)  HBsAg:   neg HIV:   neg GBS:   neg 1 hr Glucola 133  Genetic screening nl NT, nl AFP Anatomy US nl  CBC    Component Value Date/Time   WBC 11.7* 10/15/2013 1120   RBC 4.11 10/15/2013 1120   HGB 12.3 10/15/2013 1120   HCT 34.9* 10/15/2013 1120   PLT 165 10/15/2013 1120   MCV 84.9 10/15/2013 1120  MCH 29.9 10/15/2013 1120   MCHC 35.2 10/15/2013 1120   RDW 13.4 10/15/2013 1120   LYMPHSABS 2.2 04/17/2011 1616   MONOABS 0.8 04/17/2011 1616   EOSABS 0.7 04/17/2011 1616   BASOSABS 0.0 04/17/2011 1616      Assessment/Plan: 33 y.o. G3P1011 at [redacted]w[redacted]d - RCS. R/B reviewed w/ pt - Recurrent sinusitis, cont home prophylactic meds    Emma Franco A. 10/15/2013, 6:48 PM   Add TL to consent, pt sure of decision  Emma Franco A. 10/16/2013 1:34 PM

## 2013-10-15 NOTE — Patient Instructions (Signed)
Your procedure is scheduled on:  Friday, Oct. 9, 2015  Enter through the [employer] [facility] at: 12 noon  Pick up the phone at the desk and dial [phone].  Call this number if you have problems the morning of surgery: [phone].  Remember: Do NOT eat food:  After midnight tonight Do NOT drink clear liquids after:  After 9:30 a.m. Friday morning Take these medicines the morning of surgery with a SIP OF WATER: Aciphex  Do NOT wear jewelry (body piercing), metal hair clips/bobby pins, or nail polish. Do NOT wear lotions, powders, or perfumes.  You may wear deoderant. Do NOT shave for 48 hours prior to surgery. Do NOT bring valuables to the hospital.  Leave suitcase in car.  After surgery it may be brought to your room.  For patients admitted to the hospital, checkout time is 11:00 AM the day of discharge.

## 2013-10-16 ENCOUNTER — Inpatient Hospital Stay (HOSPITAL_COMMUNITY)
Admission: RE | Admit: 2013-10-16 | Discharge: 2013-10-19 | DRG: 766 | Disposition: A | Discharge status: Home / Self Care | Payer: 59 | Source: Ambulatory Visit | Attending: Obstetrics | Admitting: Obstetrics

## 2013-10-16 ENCOUNTER — Encounter (HOSPITAL_COMMUNITY): Payer: 59 | Admitting: Anesthesiology

## 2013-10-16 ENCOUNTER — Inpatient Hospital Stay (HOSPITAL_COMMUNITY): Payer: 59 | Admitting: Anesthesiology

## 2013-10-16 ENCOUNTER — Encounter (HOSPITAL_COMMUNITY)
Admission: RE | Disposition: A | Discharge status: Home / Self Care | Payer: Self-pay | Source: Ambulatory Visit | Attending: Obstetrics

## 2013-10-16 ENCOUNTER — Encounter (HOSPITAL_COMMUNITY): Payer: Self-pay | Admitting: Certified Nurse Midwife

## 2013-10-16 DIAGNOSIS — Z302 Encounter for sterilization: Secondary | ICD-10-CM | POA: Diagnosis not present

## 2013-10-16 DIAGNOSIS — O9962 Diseases of the digestive system complicating childbirth: Secondary | ICD-10-CM | POA: Diagnosis present

## 2013-10-16 DIAGNOSIS — K219 Gastro-esophageal reflux disease without esophagitis: Secondary | ICD-10-CM | POA: Diagnosis present

## 2013-10-16 DIAGNOSIS — O3421 Maternal care for scar from previous cesarean delivery: Secondary | ICD-10-CM | POA: Diagnosis present

## 2013-10-16 DIAGNOSIS — Z3A38 38 weeks gestation of pregnancy: Secondary | ICD-10-CM | POA: Diagnosis present

## 2013-10-16 DIAGNOSIS — Z823 Family history of stroke: Secondary | ICD-10-CM

## 2013-10-16 DIAGNOSIS — Z820 Family history of epilepsy and other diseases of the nervous system: Secondary | ICD-10-CM

## 2013-10-16 DIAGNOSIS — D62 Acute posthemorrhagic anemia: Secondary | ICD-10-CM | POA: Diagnosis not present

## 2013-10-16 DIAGNOSIS — O34219 Maternal care for unspecified type scar from previous cesarean delivery: Secondary | ICD-10-CM | POA: Diagnosis present

## 2013-10-16 SURGERY — Surgical Case
Anesthesia: Spinal | Site: Abdomen | Laterality: Bilateral

## 2013-10-16 MED ORDER — TETANUS-DIPHTH-ACELL PERTUSSIS 5-2.5-18.5 LF-MCG/0.5 IM SUSP: 0.5000 mL | Freq: Once | INTRAMUSCULAR | Status: DC

## 2013-10-16 MED ORDER — LANOLIN HYDROUS EX OINT: 1.0000 "application " | TOPICAL_OINTMENT | CUTANEOUS | Status: DC | PRN

## 2013-10-16 MED ORDER — FENTANYL CITRATE 0.05 MG/ML IJ SOLN
25.0000 ug | INTRAMUSCULAR | Status: DC | PRN
  Administered 2013-10-16: 50 ug via INTRAVENOUS
  Administered 2013-10-16 (×2): 25 ug via INTRAVENOUS

## 2013-10-16 MED ORDER — DIPHENHYDRAMINE HCL 25 MG PO CAPS: 25.0000 mg | ORAL_CAPSULE | ORAL | Status: DC | PRN

## 2013-10-16 MED ORDER — BUPIVACAINE IN DEXTROSE 0.75-8.25 % IT SOLN
INTRATHECAL | Status: DC | PRN
  Administered 2013-10-16: 1.5 mL via INTRATHECAL

## 2013-10-16 MED ORDER — DIPHENHYDRAMINE HCL 50 MG/ML IJ SOLN: 12.5000 mg | INTRAMUSCULAR | Status: DC | PRN

## 2013-10-16 MED ORDER — NALBUPHINE HCL 10 MG/ML IJ SOLN: 5.0000 mg | INTRAMUSCULAR | Status: DC | PRN

## 2013-10-16 MED ORDER — MORPHINE SULFATE (PF) 0.5 MG/ML IJ SOLN
INTRAMUSCULAR | Status: DC | PRN
  Administered 2013-10-16: .1 mg via INTRATHECAL

## 2013-10-16 MED ORDER — SIMETHICONE 80 MG PO CHEW
80.0000 mg | CHEWABLE_TABLET | ORAL | Status: DC | PRN
  Administered 2013-10-17: 80 mg via ORAL

## 2013-10-16 MED ORDER — CEFAZOLIN SODIUM-DEXTROSE 2-3 GM-% IV SOLR
INTRAVENOUS | Status: AC
  Filled 2013-10-16: qty 50

## 2013-10-16 MED ORDER — SCOPOLAMINE 1 MG/3DAYS TD PT72
1.0000 | MEDICATED_PATCH | TRANSDERMAL | Status: DC
  Administered 2013-10-16: 1.5 mg via TRANSDERMAL

## 2013-10-16 MED ORDER — KETOROLAC TROMETHAMINE 30 MG/ML IJ SOLN: 30.0000 mg | Freq: Four times a day (QID) | INTRAMUSCULAR | Status: DC | PRN

## 2013-10-16 MED ORDER — FENTANYL CITRATE 0.05 MG/ML IJ SOLN
INTRAMUSCULAR | Status: AC
  Administered 2013-10-16: 25 ug via INTRAVENOUS
  Filled 2013-10-16: qty 2

## 2013-10-16 MED ORDER — DIPHENHYDRAMINE HCL 25 MG PO CAPS
25.0000 mg | ORAL_CAPSULE | Freq: Four times a day (QID) | ORAL | Status: DC | PRN
Start: 2013-10-16 — End: 2013-10-19

## 2013-10-16 MED ORDER — MEPERIDINE HCL 25 MG/ML IJ SOLN: 6.2500 mg | INTRAMUSCULAR | Status: DC | PRN

## 2013-10-16 MED ORDER — DIBUCAINE 1 % RE OINT: 1.0000 "application " | TOPICAL_OINTMENT | RECTAL | Status: DC | PRN

## 2013-10-16 MED ORDER — LEVOCETIRIZINE DIHYDROCHLORIDE 5 MG PO TABS: 5.0000 mg | ORAL_TABLET | Freq: Every evening | ORAL | Status: DC

## 2013-10-16 MED ORDER — SIMETHICONE 80 MG PO CHEW
80.0000 mg | CHEWABLE_TABLET | Freq: Three times a day (TID) | ORAL | Status: DC
  Administered 2013-10-17 – 2013-10-19 (×8): 80 mg via ORAL
  Filled 2013-10-16 (×8): qty 1

## 2013-10-16 MED ORDER — KETOROLAC TROMETHAMINE 30 MG/ML IJ SOLN
INTRAMUSCULAR | Status: AC
  Filled 2013-10-16: qty 1

## 2013-10-16 MED ORDER — NALOXONE HCL 0.4 MG/ML IJ SOLN: 0.4000 mg | INTRAMUSCULAR | Status: DC | PRN

## 2013-10-16 MED ORDER — PHENYLEPHRINE 8 MG IN D5W 100 ML (0.08MG/ML) PREMIX OPTIME
INJECTION | INTRAVENOUS | Status: DC | PRN
  Administered 2013-10-16: 60 ug/min via INTRAVENOUS

## 2013-10-16 MED ORDER — LACTATED RINGERS IV SOLN
INTRAVENOUS | Status: DC
  Administered 2013-10-16 (×4): via INTRAVENOUS

## 2013-10-16 MED ORDER — PHENYLEPHRINE 8 MG IN D5W 100 ML (0.08MG/ML) PREMIX OPTIME
INJECTION | INTRAVENOUS | Status: AC
  Filled 2013-10-16: qty 100

## 2013-10-16 MED ORDER — DEXAMETHASONE SODIUM PHOSPHATE 4 MG/ML IJ SOLN
INTRAMUSCULAR | Status: DC | PRN
  Administered 2013-10-16: 4 mg via INTRAVENOUS

## 2013-10-16 MED ORDER — PROMETHAZINE HCL 25 MG/ML IJ SOLN: 6.2500 mg | INTRAMUSCULAR | Status: DC | PRN

## 2013-10-16 MED ORDER — OXYTOCIN 10 UNIT/ML IJ SOLN
40.0000 [IU] | INTRAVENOUS | Status: DC | PRN
  Administered 2013-10-16: 40 [IU] via INTRAVENOUS

## 2013-10-16 MED ORDER — SCOPOLAMINE 1 MG/3DAYS TD PT72
MEDICATED_PATCH | TRANSDERMAL | Status: AC
  Filled 2013-10-16: qty 1

## 2013-10-16 MED ORDER — LACTATED RINGERS IV SOLN
INTRAVENOUS | Status: DC
  Administered 2013-10-16: 23:00:00 via INTRAVENOUS

## 2013-10-16 MED ORDER — SENNOSIDES-DOCUSATE SODIUM 8.6-50 MG PO TABS
2.0000 | ORAL_TABLET | ORAL | Status: DC
  Administered 2013-10-16 – 2013-10-19 (×3): 2 via ORAL
  Filled 2013-10-16 (×2): qty 2

## 2013-10-16 MED ORDER — FENTANYL CITRATE 0.05 MG/ML IJ SOLN
INTRAMUSCULAR | Status: DC | PRN
  Administered 2013-10-16: 15 ug via INTRATHECAL

## 2013-10-16 MED ORDER — SODIUM CHLORIDE 0.9 % IR SOLN
Status: DC | PRN
  Administered 2013-10-16: 1000 mL

## 2013-10-16 MED ORDER — CEFAZOLIN SODIUM-DEXTROSE 2-3 GM-% IV SOLR
2.0000 g | INTRAVENOUS | Status: AC
  Administered 2013-10-16: 2 g via INTRAVENOUS

## 2013-10-16 MED ORDER — PRENATAL MULTIVITAMIN CH
1.0000 | ORAL_TABLET | Freq: Every day | ORAL | Status: DC
  Administered 2013-10-17 – 2013-10-19 (×3): 1 via ORAL
  Filled 2013-10-16 (×3): qty 1

## 2013-10-16 MED ORDER — OXYTOCIN 40 UNITS IN LACTATED RINGERS INFUSION - SIMPLE MED: 62.5000 mL/h | INTRAVENOUS | Status: AC

## 2013-10-16 MED ORDER — SIMETHICONE 80 MG PO CHEW
80.0000 mg | CHEWABLE_TABLET | ORAL | Status: DC
  Administered 2013-10-16 – 2013-10-19 (×2): 80 mg via ORAL
  Filled 2013-10-16 (×2): qty 1

## 2013-10-16 MED ORDER — MENTHOL 3 MG MT LOZG
1.0000 | LOZENGE | OROMUCOSAL | Status: DC | PRN
Start: 2013-10-16 — End: 2013-10-19

## 2013-10-16 MED ORDER — FENTANYL CITRATE 0.05 MG/ML IJ SOLN
INTRAMUSCULAR | Status: AC
Start: 2013-10-16 — End: 2013-10-16
  Filled 2013-10-16: qty 2

## 2013-10-16 MED ORDER — NALBUPHINE HCL 10 MG/ML IJ SOLN: 5.0000 mg | Freq: Once | INTRAMUSCULAR | Status: AC | PRN

## 2013-10-16 MED ORDER — ONDANSETRON HCL 4 MG/2ML IJ SOLN
4.0000 mg | INTRAMUSCULAR | Status: DC | PRN
Start: 2013-10-16 — End: 2013-10-19

## 2013-10-16 MED ORDER — SODIUM CHLORIDE 0.9 % IJ SOLN: 3.0000 mL | INTRAMUSCULAR | Status: DC | PRN

## 2013-10-16 MED ORDER — PANTOPRAZOLE SODIUM 40 MG PO TBEC
40.0000 mg | DELAYED_RELEASE_TABLET | Freq: Every day | ORAL | Status: DC
  Administered 2013-10-17 – 2013-10-19 (×3): 40 mg via ORAL
  Filled 2013-10-16 (×3): qty 1

## 2013-10-16 MED ORDER — NALOXONE HCL 1 MG/ML IJ SOLN: 1.0000 ug/kg/h | INTRAMUSCULAR | Status: DC | PRN

## 2013-10-16 MED ORDER — OXYTOCIN 10 UNIT/ML IJ SOLN
INTRAMUSCULAR | Status: AC
  Filled 2013-10-16: qty 5

## 2013-10-16 MED ORDER — OXYCODONE-ACETAMINOPHEN 5-325 MG PO TABS
2.0000 | ORAL_TABLET | ORAL | Status: DC | PRN
  Administered 2013-10-17 – 2013-10-19 (×8): 2 via ORAL
  Filled 2013-10-16 (×9): qty 2

## 2013-10-16 MED ORDER — ZOLPIDEM TARTRATE 5 MG PO TABS: 5.0000 mg | ORAL_TABLET | Freq: Every evening | ORAL | Status: DC | PRN

## 2013-10-16 MED ORDER — KETOROLAC TROMETHAMINE 30 MG/ML IJ SOLN
30.0000 mg | Freq: Four times a day (QID) | INTRAMUSCULAR | Status: DC | PRN
  Administered 2013-10-16: 30 mg via INTRAMUSCULAR

## 2013-10-16 MED ORDER — MUPIROCIN CALCIUM 2 % NA OINT: 1.0000 "application " | TOPICAL_OINTMENT | Freq: Every day | NASAL | Status: DC

## 2013-10-16 MED ORDER — WITCH HAZEL-GLYCERIN EX PADS: 1.0000 "application " | MEDICATED_PAD | CUTANEOUS | Status: DC | PRN

## 2013-10-16 MED ORDER — ONDANSETRON HCL 4 MG/2ML IJ SOLN
INTRAMUSCULAR | Status: DC | PRN
  Administered 2013-10-16: 4 mg via INTRAVENOUS

## 2013-10-16 MED ORDER — MORPHINE SULFATE 0.5 MG/ML IJ SOLN
INTRAMUSCULAR | Status: AC
  Filled 2013-10-16: qty 10

## 2013-10-16 MED ORDER — LORATADINE 10 MG PO TABS
10.0000 mg | ORAL_TABLET | Freq: Every day | ORAL | Status: DC
  Administered 2013-10-17 – 2013-10-19 (×3): 10 mg via ORAL
  Filled 2013-10-16 (×4): qty 1

## 2013-10-16 MED ORDER — FLUTICASONE PROPIONATE 50 MCG/ACT NA SUSP
2.0000 | Freq: Every day | NASAL | Status: DC
  Administered 2013-10-17 – 2013-10-19 (×3): 2 via NASAL
  Filled 2013-10-16: qty 16

## 2013-10-16 MED ORDER — OXYCODONE-ACETAMINOPHEN 5-325 MG PO TABS
1.0000 | ORAL_TABLET | ORAL | Status: DC | PRN
  Administered 2013-10-17 – 2013-10-19 (×4): 1 via ORAL
  Filled 2013-10-16 (×3): qty 1

## 2013-10-16 MED ORDER — ONDANSETRON HCL 4 MG/2ML IJ SOLN: 4.0000 mg | Freq: Three times a day (TID) | INTRAMUSCULAR | Status: DC | PRN

## 2013-10-16 MED ORDER — SCOPOLAMINE 1 MG/3DAYS TD PT72: 1.0000 | MEDICATED_PATCH | Freq: Once | TRANSDERMAL | Status: DC

## 2013-10-16 MED ORDER — KETOROLAC TROMETHAMINE 30 MG/ML IJ SOLN: 15.0000 mg | Freq: Once | INTRAMUSCULAR | Status: DC | PRN

## 2013-10-16 MED ORDER — ONDANSETRON HCL 4 MG/2ML IJ SOLN
INTRAMUSCULAR | Status: AC
  Filled 2013-10-16: qty 2

## 2013-10-16 MED ORDER — MIDAZOLAM HCL 2 MG/2ML IJ SOLN: 0.5000 mg | Freq: Once | INTRAMUSCULAR | Status: DC | PRN

## 2013-10-16 MED ORDER — DEXAMETHASONE SODIUM PHOSPHATE 10 MG/ML IJ SOLN
INTRAMUSCULAR | Status: AC
  Filled 2013-10-16: qty 1

## 2013-10-16 MED ORDER — IBUPROFEN 600 MG PO TABS
600.0000 mg | ORAL_TABLET | Freq: Four times a day (QID) | ORAL | Status: DC
  Administered 2013-10-16 – 2013-10-19 (×11): 600 mg via ORAL
  Filled 2013-10-16 (×11): qty 1

## 2013-10-16 MED ORDER — ONDANSETRON HCL 4 MG PO TABS: 4.0000 mg | ORAL_TABLET | ORAL | Status: DC | PRN

## 2013-10-16 SURGICAL SUPPLY — 45 items
BARRIER ADHS 3X4 INTERCEED (GAUZE/BANDAGES/DRESSINGS) ×3 IMPLANT
BENZOIN TINCTURE PRP APPL 2/3 (GAUZE/BANDAGES/DRESSINGS) ×3 IMPLANT
CLAMP CORD UMBIL (MISCELLANEOUS) IMPLANT
CLOSURE WOUND 1/2 X4 (GAUZE/BANDAGES/DRESSINGS) ×1
CLOTH BEACON ORANGE TIMEOUT ST (SAFETY) ×3 IMPLANT
CONTAINER PREFILL 10% NBF 15ML (MISCELLANEOUS) IMPLANT
COVER LIGHT HANDLE  1/PK (MISCELLANEOUS) ×4
COVER LIGHT HANDLE 1/PK (MISCELLANEOUS) ×2 IMPLANT
DRAPE SHEET LG 3/4 BI-LAMINATE (DRAPES) IMPLANT
DRSG OPSITE POSTOP 4X10 (GAUZE/BANDAGES/DRESSINGS) ×3 IMPLANT
DURAPREP 26ML APPLICATOR (WOUND CARE) ×3 IMPLANT
ELECT REM PT RETURN 9FT ADLT (ELECTROSURGICAL) ×3
ELECTRODE REM PT RTRN 9FT ADLT (ELECTROSURGICAL) ×1 IMPLANT
EXTRACTOR VACUUM M CUP 4 TUBE (SUCTIONS) IMPLANT
EXTRACTOR VACUUM M CUP 4' TUBE (SUCTIONS)
GLOVE BIO SURGEON STRL SZ 6.5 (GLOVE) ×2 IMPLANT
GLOVE BIO SURGEON STRL SZ7.5 (GLOVE) ×3 IMPLANT
GLOVE BIO SURGEONS STRL SZ 6.5 (GLOVE) ×1
GLOVE BIOGEL PI IND STRL 7.0 (GLOVE) ×5 IMPLANT
GLOVE BIOGEL PI IND STRL 7.5 (GLOVE) ×1 IMPLANT
GLOVE BIOGEL PI INDICATOR 7.0 (GLOVE) ×10
GLOVE BIOGEL PI INDICATOR 7.5 (GLOVE) ×2
GLOVE ECLIPSE 6.5 STRL STRAW (GLOVE) ×3 IMPLANT
GLOVE SURG SS PI 6.5 STRL IVOR (GLOVE) ×3 IMPLANT
GOWN STRL REUS W/TWL LRG LVL3 (GOWN DISPOSABLE) ×12 IMPLANT
KIT ABG SYR 3ML LUER SLIP (SYRINGE) IMPLANT
NEEDLE HYPO 25X5/8 SAFETYGLIDE (NEEDLE) IMPLANT
NS IRRIG 1000ML POUR BTL (IV SOLUTION) ×3 IMPLANT
PACK C SECTION WH (CUSTOM PROCEDURE TRAY) ×3 IMPLANT
PAD OB MATERNITY 4.3X12.25 (PERSONAL CARE ITEMS) ×3 IMPLANT
STAPLER VISISTAT 35W (STAPLE) IMPLANT
STRIP CLOSURE SKIN 1/2X4 (GAUZE/BANDAGES/DRESSINGS) ×2 IMPLANT
SUT MON AB 4-0 PS1 27 (SUTURE) IMPLANT
SUT PLAIN 0 NONE (SUTURE) ×6 IMPLANT
SUT PLAIN 2 0 XLH (SUTURE) ×3 IMPLANT
SUT VIC AB 0 CT1 36 (SUTURE) ×6 IMPLANT
SUT VIC AB 0 CTX 36 (SUTURE) ×4
SUT VIC AB 0 CTX36XBRD ANBCTRL (SUTURE) ×2 IMPLANT
SUT VIC AB 2-0 CT1 27 (SUTURE) ×2
SUT VIC AB 2-0 CT1 TAPERPNT 27 (SUTURE) ×1 IMPLANT
SUT VIC AB 2-0 CT2 27 (SUTURE) ×3 IMPLANT
SUT VIC AB 4-0 KS 27 (SUTURE) ×3 IMPLANT
TOWEL OR 17X24 6PK STRL BLUE (TOWEL DISPOSABLE) ×3 IMPLANT
TRAY FOLEY CATH 14FR (SET/KITS/TRAYS/PACK) IMPLANT
WATER STERILE IRR 1000ML POUR (IV SOLUTION) ×3 IMPLANT

## 2013-10-16 NOTE — Brief Op Note (Signed)
10/16/2013  4:25 PM  PATIENT:  Emma Franco  33 y.o. female  PRE-OPERATIVE DIAGNOSIS:  Previous Cesarean Section, Possible Uterine Dehiscence, unsedired fertility  POST-OPERATIVE DIAGNOSIS:  Previous Cesarean Section, Pelvic adhesions, undesired fertility  PROCEDURE:  Procedure(s) with comments: REPEAT CESAREAN SECTION WITH BILATERAL TUBAL LIGATION  (Bilateral) -  EDD: 10/26/13 Lysis of adhesions  SURGEON:  Surgeon(s) and Role:    Claiborne Billings A. Pamala Hurry, MD - Primary  PHYSICIAN ASSISTANT:   ASSISTANTSSunday Corn, CNM   ANESTHESIA:   spinal  EBL:  Total I/O In: 2400 [I.V.:2400] Out: 1050 [Urine:300; Blood:750]  BLOOD ADMINISTERED:none  DRAINS: Urinary Catheter (Foley)   LOCAL MEDICATIONS USED:  NONE  SPECIMEN:  Source of Specimen:  Placenta  DISPOSITION OF SPECIMEN:  L&D  COUNTS:  YES  TOURNIQUET:  * No tourniquets in log *  DICTATION: .Note written in EPIC  PLAN OF CARE: Admit to inpatient   PATIENT DISPOSITION:  PACU - hemodynamically stable.   Delay start of Pharmacological VTE agent (>24hrs) due to surgical blood loss or risk of bleeding: yes

## 2013-10-16 NOTE — Anesthesia Procedure Notes (Signed)
Spinal  Patient location during procedure: OR Start time: 10/16/2013 1:57 PM Staffing Anesthesiologist: CASSIDY, AMY Performed by: anesthesiologist  Preanesthetic Checklist Completed: patient identified, site marked, surgical consent, pre-op evaluation, timeout performed, IV checked, risks and benefits discussed and monitors and equipment checked Spinal Block Patient position: sitting Prep: site prepped and draped and DuraPrep Patient monitoring: heart rate, cardiac monitor, continuous pulse ox and blood pressure Approach: midline Location: L3-4 Injection technique: single-shot Needle Needle type: Pencan  Needle gauge: 24 G Needle length: 9 cm Assessment Sensory level: T4 Additional Notes Clear free flow CSF on first attempt.  No paresthesia.  Patient tolerated procedure well with no apparent complications.  Charlton Haws, MD

## 2013-10-16 NOTE — Lactation Note (Signed)
This note was copied from the chart of Emma Madie Cahn. Lactation Consultation Note  Patient Name: Emma Franco [id number] Date: 10/16/2013 Reason for consult: Initial assessment;Difficult latch Mom and baby are 6 hours postpartum.  Mom is post C/S and still receiving IV fluids which makes latching more awkward.  She has short, soft nipples and her (L) nipple is dimpled in center.  LC assisted her with latch and demonstrated hand expression (large drops seen).  Baby unable to grasp areola fully so LC suggested trying a NS.  Mom had difficulty breastfeeding her 42 yo daughter and breast and formula/bottle-fed her for 6 weeks.  She did have some experience using a NS.  LC demonstrated application and use of NS and after several brief latch attempts, baby sustained latch with a few swallows and no nipple discomfort.  LC reported feeding assessment to RN caring for this dyad.  LC encouraged cue feedings and frequent STS. LC encouraged review of Baby and Me pp 9, 14 and 20-25 for STS and BF information. LC provided [employer] brochure and reviewed [facility] services and list of community and web site resources.     Maternal Data Formula Feeding for Exclusion: No Has patient been taught Hand Expression?: Yes (LC demonstrated and large drops noted) Does the patient have breastfeeding experience prior to this delivery?: Yes  Feeding Feeding Type: Breast Fed Length of feed: 15 min  LATCH Score/Interventions Latch: Grasps breast easily, tongue down, lips flanged, rhythmical sucking. Intervention(s): Skin to skin;Teach feeding cues;Waking techniques  Audible Swallowing: A few with stimulation Intervention(s): Skin to skin;Hand expression Intervention(s): Skin to skin;Hand expression;Alternate breast massage  Type of Nipple: Everted at rest and after stimulation (left nipple has dimpled center; both nipples short/soft) Intervention(s): No intervention needed  Comfort (Breast/Nipple): Soft /  non-tender     Hold (Positioning): Assistance needed to correctly position infant at breast and maintain latch. Intervention(s): Breastfeeding basics reviewed;Support Pillows;Position options;Skin to skin  LATCH Score: 8  (LC assisted and observed)  Lactation Tools Discussed/Used Tools: Nipple Shields Nipple shield size: 20 STS, cue feedings, hand expression  Consult Status Consult Status: Follow-up Date: 10/17/13 Follow-up type: In-patient    Junious Dresser [facility] 10/16/2013, 9:08 PM

## 2013-10-16 NOTE — Op Note (Signed)
10/16/2013  4:25 PM  PATIENT:  Emma Franco  33 y.o. female  PRE-OPERATIVE DIAGNOSIS:  Previous Cesarean Section, Possible Uterine Dehiscence, unsedired fertility  POST-OPERATIVE DIAGNOSIS:  Previous Cesarean Section, Pelvic adhesions, undesired fertility  PROCEDURE:  Procedure(s) with comments: REPEAT CESAREAN SECTION WITH BILATERAL TUBAL LIGATION  (Bilateral) -  EDD: 10/26/13 Lysis of adhesions  SURGEON:  Surgeon(s) and Role:    Claiborne Billings A. Pamala Hurry, MD - Primary  PHYSICIAN ASSISTANT:   ASSISTANTSSunday Franco, CNM   ANESTHESIA:   spinal  EBL:  Total I/O In: 2400 [I.V.:2400] Out: 1050 [Urine:300; Blood:750]  BLOOD ADMINISTERED:none  DRAINS: Urinary Catheter (Foley)   LOCAL MEDICATIONS USED:  NONE  SPECIMEN:  Source of Specimen:  Placenta  DISPOSITION OF SPECIMEN:  L&D  COUNTS:  YES  TOURNIQUET:  * No tourniquets in log *  DICTATION: .Note written in EPIC  PLAN OF CARE: Admit to inpatient   PATIENT DISPOSITION:  PACU - hemodynamically stable.   Delay start of Pharmacological VTE agent (>24hrs) due to surgical blood loss or risk of bleeding: yes   Findings:  @BABYSEXEBC @ infant,  APGAR (1 MIN): 8   APGAR (5 MINS): 9   APGAR (10 MINS):   Normal tubes and ovaries, normal placenta. 3VC, clear amniotic fluid Uterus adhesed to anterior abdominal wall  EBL: per anasthesia Antibiotics:   2g Ancef Complications: none  Indications: This is a 33 y.o. year-old, G3P1011  At [redacted]w[redacted]d admitted for RCS. Risks benefits and alternatives of the procedure were discussed with the patient who agreed to proceed  Procedure:  After informed consent was obtained the patient was taken to the operating room where spinal anesthesia was initiated.  She was prepped and draped in the normal sterile fashion in dorsal supine position with a leftward tilt.  A foley catheter was in place.  A Pfannenstiel skin incision was made 2 cm above the pubic symphysis in the midline with the  scalpel.  Dissection was carried down with the Bovie cautery until the fascia was reached. The fascia was incised in the midline. The incision was extended laterally with the Mayo scissors. The inferior aspect of the fascial incision was grasped with the Coker clamps, elevated up and the underlying rectus muscles were dissected off sharply. The superior aspect of the fascial incision was grasped with the Coker clamps elevated up and the underlying rectus muscles were dissected off sharply.  The peritoneum was entered sharply. Only a small window to the uterus was noted with thick adhesions throughout. About 15 min was spent in careful dissection. Through this process the uterine serosa was entered and some dissection through the uterine myometrium was done. Eventually improved visualization, bladder freed of lower segment, lateral uterus dissected off of anterior abdominal wall. The bladder blade was inserted.  The lower segment of the uterus was incised sharply with the scalpel and extended sharply with the bandage scissors. he infant was grasped, brought to the incision,  rotated and the infant was delivered with fundal pressure. Nuchal cord x2 was reduced The nose and mouth were bulb suctioned. The cord was clamped and cut. The infant was handed off to the waiting pediatrician. The placenta was expressed. The uterus was exteriorized. The uterus was cleared of all clots and debris. The uterine incision was repaired with 0 Vicryl in a running locked fashion.  Once the uterus was closed additional dissection was done to free the apex of the incision. Omental and peritoneal adhesions were dissected. A second layer of  the same suture was used in an imbricating fashion to obtain excellent hemostasis. The anterior uterine wall defect in the myometrium was closed with 2-0 Vicryl in a baseball stitch fashion. This suture incorporated both the transected area of the myometrium and the uterine serosa. This was carried down  to the lower segment incision. Bilateral fallopian tubes were assessed carried out to the fimbriated end Babcock was placed in the mid isthmic portion and a free-tie of plain gut x2 was placed over 1 cm knuckle of each tube. The Metzenbaum were used to pierce the mesosalpinx and the knuckle of tube was excised sharply. Hemostasis was noted.  The uterus was then returned to the abdomen, the tubal segments were reinspected and the sutures in place and hemostasis assured. The uterine incision was reinspected and found to be hemostatic. There was insufficient peritoneum to close. A piece of Surgicel was cut in half one was placed over the anterior uterine wall in a vertical orientation and the second one over the lower segment incision in a horizontal orientation . he cut muscle edges and the underside of the fascia were inspected and found to be hemostatic. The rectus muscles were reapproximated with a single loose suture of 2-0 plain in a figure-of-eight fashion. The fascia was closed with 0 Vicryl in two halves . The subcutaneous tissue was irrigated. Scarpa's layer was closed with a 2-0 plain gut suture. The skin was closed with a 4-0 Monocryl in a single layer. The patient tolerated the procedure well. Sponge lap and needle counts were correct x3 and patient was taken to the recovery room in a stable condition.  [name] A. 10/16/2013 4:26 PM

## 2013-10-16 NOTE — Anesthesia Preprocedure Evaluation (Signed)
Anesthesia Evaluation  Patient identified by MRN, date of birth, ID band Patient awake    Reviewed: Allergy & Precautions, H&P , NPO status , Patient's Chart, lab work & pertinent test results  History of Anesthesia Complications (+) PONV and history of anesthetic complications  Airway Mallampati: II      Dental   Pulmonary  breath sounds clear to auscultation        Cardiovascular Exercise Tolerance: Good Rhythm:regular Rate:Normal     Neuro/Psych  Headaches,  Neuromuscular disease    GI/Hepatic hiatal hernia, GERD-  ,  Endo/Other    Renal/GU      Musculoskeletal   Abdominal   Peds  Hematology   Anesthesia Other Findings scoliosis  Reproductive/Obstetrics (+) Pregnancy                           Anesthesia Physical Anesthesia Plan  ASA: II  Anesthesia Plan: Spinal   Post-op Pain Management:    Induction:   Airway Management Planned:   Additional Equipment:   Intra-op Plan:   Post-operative Plan:   Informed Consent: I have reviewed the patients History and Physical, chart, labs and discussed the procedure including the risks, benefits and alternatives for the proposed anesthesia with the patient or authorized representative who has indicated his/her understanding and acceptance.     Plan Discussed with: Anesthesiologist, CRNA and Surgeon  Anesthesia Plan Comments:         Anesthesia Quick Evaluation

## 2013-10-16 NOTE — Transfer of Care (Signed)
Immediate Anesthesia Transfer of Care Note  Patient: Emma Franco  Procedure(s) Performed: Procedure(s) with comments: REPEAT CESAREAN SECTION WITH BILATERAL TUBAL LIGATION  (Bilateral) -  EDD: 10/26/13  Patient Location: PACU  Anesthesia Type:Spinal  Level of Consciousness: awake, alert , oriented and patient cooperative  Airway & Oxygen Therapy: Patient Spontanous Breathing  Post-op Assessment: Report given to PACU RN and Post -op Vital signs reviewed and stable  Post vital signs: Reviewed and stable  Complications: No apparent anesthesia complications

## 2013-10-16 NOTE — Anesthesia Postprocedure Evaluation (Signed)
  Anesthesia Post-op Note  Anesthesia Post Note  Patient: Emma Franco  Procedure(s) Performed: Procedure(s) (LRB): REPEAT CESAREAN SECTION WITH BILATERAL TUBAL LIGATION  (Bilateral)  Anesthesia type: Spinal  Patient location: PACU  Post pain: Pain level controlled  Post assessment: Post-op Vital signs reviewed  Last Vitals:  Filed Vitals:   10/16/13 1545  BP: 104/63  Pulse: 92  Temp:   Resp: 20    Post vital signs: Reviewed  Level of consciousness: awake  Complications: No apparent anesthesia complications

## 2013-10-17 DIAGNOSIS — D62 Acute posthemorrhagic anemia: Secondary | ICD-10-CM | POA: Diagnosis not present

## 2013-10-17 LAB — CBC
HCT: 30.2 % — ABNORMAL LOW (ref 36.0–46.0)
Hemoglobin: 10.4 g/dL — ABNORMAL LOW (ref 12.0–15.0)
MCH: 29.5 pg (ref 26.0–34.0)
MCHC: 34.4 g/dL (ref 30.0–36.0)
MCV: 85.8 fL (ref 78.0–100.0)
PLATELETS: 153 10*3/uL (ref 150–400)
RBC: 3.52 MIL/uL — ABNORMAL LOW (ref 3.87–5.11)
RDW: 13.5 % (ref 11.5–15.5)
WBC: 15.4 10*3/uL — ABNORMAL HIGH (ref 4.0–10.5)

## 2013-10-17 MED ORDER — POLYSACCHARIDE IRON COMPLEX 150 MG PO CAPS
150.0000 mg | ORAL_CAPSULE | Freq: Every day | ORAL | Status: DC
  Administered 2013-10-17 – 2013-10-19 (×3): 150 mg via ORAL
  Filled 2013-10-17 (×3): qty 1

## 2013-10-17 NOTE — Anesthesia Postprocedure Evaluation (Addendum)
Anesthesia Post Note  Patient: Emma Franco  Procedure(s) Performed: Procedure(s) (LRB): REPEAT CESAREAN SECTION WITH BILATERAL TUBAL LIGATION  (Bilateral)  Anesthesia type:spinal  Patient location: Mother/Baby  Post pain: Pain level controlled  Post assessment: Post-op Vital signs reviewed  Last Vitals:  Filed Vitals:   10/17/13 0519  BP: 96/56  Pulse: 71  Temp: 36.8 C  Resp: 18    Post vital signs: Reviewed  Level of consciousness:alert  Complications: No apparent anesthesia complications

## 2013-10-17 NOTE — Progress Notes (Signed)
POD # 1  Subjective: Pt reports feeling well/ Pain controlled with Motrin and Percocet Tolerating po/ Foley d/c'd and voiding without problems/ No n/v/ Flatus-small amt Activity: ad lib Bleeding is light Newborn info:  Information for the patient's newborn:  Jamilee, [place] Girl Tyashia Morrisette [[id number]]  female Feeding: breast   Objective:  VS:  Filed Vitals:   10/17/13 0115 10/17/13 0315 10/17/13 0519 10/17/13 0915  BP: 95/55 96/59 96/56  97/59  Pulse: 78 73 71 73  Temp: 98.2 F (36.8 C) 98.2 F (36.8 C) 98.2 F (36.8 C) 98 F (36.7 C)  TempSrc: Oral Oral Oral Oral  Resp: 18 18 18 18   Weight:      SpO2: 95% 95% 94% 100%     I&O: Intake/Output     10/09 0701 - 10/10 0700 10/10 0701 - 10/11 0700   P.O. 300 680   I.V. (mL/kg) 3177.1 (38.2)    Total Intake(mL/kg) 3477.1 (41.9) 680 (8.2)   Urine (mL/kg/hr) 2675 660 (1.7)   Blood 750    Total Output 3425 660   Net +52.1 +20           Recent Labs  10/15/13 1120 10/17/13 0616  WBC 11.7* 15.4*  HGB 12.3 10.4*  HCT 34.9* 30.2*  PLT 165 153    Blood type: --/--/B POS, B POS (10/08 1120) Rubella:      Physical Exam:  General: alert and cooperative CV: Regular rate and rhythm Resp: CTA bilaterally Abdomen: soft, nontender, normal bowel sounds Incision: healing well, no drainage, no erythema, no hernia, no seroma, no swelling, well approximated, honeycomb dsg c/d/i Uterine Fundus: firm, below umbilicus, nontender Lochia: minimal Ext: extremities normal, atraumatic, no cyanosis or edema and Homans sign is negative, no sign of DVT    Assessment: POD # 1/ G3P2012/ S/P C/Section d/t repeat ABL anemia Doing well  Plan: Start Niferex Ambulate Continue routine post op orders   Signed: Julianne Handler, N, MSN, CNM 10/17/2013, 11:46 AM

## 2013-10-17 NOTE — Addendum Note (Signed)
Addendum created 10/17/13 0805 by Lenox Ponds, CRNA   Modules edited: Notes Section   Notes Section:  File: [id number]; File: [id number]

## 2013-10-18 NOTE — Progress Notes (Addendum)
POD # 2  Subjective: Pt reports feeling well/ Pain controlled with Motrin and Percocet Tolerating po/Voiding without problems/ No n/v/ Flatus present Activity: ad lib Bleeding is light Newborn info:  Information for the patient's newborn:  Hajra, [place] Girl Tami Blass [[id number]]  female Feeding: breast, working with nipple shield   Objective: VS:  Filed Vitals:   10/17/13 0915 10/17/13 1315 10/17/13 1730 10/18/13 0530  BP: 97/59 106/70 109/66 104/78  Pulse: 73 83 82 75  Temp: 98 F (36.7 C)  97.9 F (36.6 C) 97.5 F (36.4 C)  TempSrc: Oral  Oral Oral  Resp: 18 16 16 18   Weight:      SpO2: 100% 100% 97%     I&O: Intake/Output     10/10 0701 - 10/11 0700 10/11 0701 - 10/12 0700   P.O. 880    I.V. (mL/kg)     Total Intake(mL/kg) 880 (10.6)    Urine (mL/kg/hr) 1460 (0.7)    Blood     Total Output 1460     Net -580            LABS:  Recent Labs  10/15/13 1120 10/17/13 0616  WBC 11.7* 15.4*  HGB 12.3 10.4*  HCT 34.9* 30.2*  PLT 165 153    Blood type: --/--/B POS, B POS (10/08 1120) Rubella:   Immune    Physical Exam:  General: alert and cooperative CV: Regular rate and rhythm Resp: CTA bilaterally Abdomen: soft, nontender, normal bowel sounds Uterine Fundus: firm, below umbilicus, nontender Incision: Covered with Tegaderm and honeycomb dressing; no significant drainage, edema, bruising, or erythema; well approximated with suture Lochia: minimal Ext: extremities normal, atraumatic, no cyanosis or edema and Homans sign is negative, no sign of DVT    Assessment/: POD # 2/ G3P2012/ S/P C/Section d/t repeat Doing well  Plan: Continue routine post op orders Anticipate discharge home tomorrow   Signed: Julianne Handler, Delane Ginger, MSN, CNM 10/18/2013, 10:59 AM

## 2013-10-19 ENCOUNTER — Encounter (HOSPITAL_COMMUNITY): Payer: Self-pay | Admitting: Obstetrics

## 2013-10-19 DIAGNOSIS — O34219 Maternal care for unspecified type scar from previous cesarean delivery: Secondary | ICD-10-CM | POA: Diagnosis present

## 2013-10-19 MED ORDER — IBUPROFEN 600 MG PO TABS: 600.0000 mg | ORAL_TABLET | Freq: Four times a day (QID) | ORAL | Status: DC

## 2013-10-19 MED ORDER — POLYSACCHARIDE IRON COMPLEX 150 MG PO CAPS: 150.0000 mg | ORAL_CAPSULE | Freq: Every day | ORAL | Status: DC

## 2013-10-19 MED ORDER — OXYCODONE-ACETAMINOPHEN 5-325 MG PO TABS: 1.0000 | ORAL_TABLET | ORAL | Status: DC | PRN

## 2013-10-19 NOTE — Progress Notes (Signed)
Patient declined need of a social work consult. Ready for discharge at this time. No needs expressed. Deniece Ree, Rn

## 2013-10-19 NOTE — Discharge Instructions (Signed)
Breast Pumping Tips °If you are breastfeeding, there may be times when you cannot feed your baby directly. Returning to work or going on a trip are common examples. Pumping allows you to store breast milk and feed it to your baby later.  °You may not get much milk when you first start to pump. Your breasts should start to make more after a few days. If you pump at the times you usually feed your baby, you may be able to keep making enough milk to feed your baby without also using formula. The more often you pump, the more milk you will produce.  °WHEN SHOULD I PUMP?  °· You can begin to pump soon after delivery. However, some experts recommend waiting about 4 weeks before giving your infant a bottle to make sure breastfeeding is going well.  °· If you plan to return to work, begin pumping a few weeks before. This will help you develop techniques that work best for you. It also lets you build up a supply of breast milk.   °· When you are with your infant, feed on demand and pump after each feeding.   °· When you are away from your infant for several hours, pump for about 15 minutes every 2-3 hours. Pump both breasts at the same time if you can.   °· If your infant has a formula feeding, make sure to pump around the same time.     °· If you drink any alcohol, wait 2 hours before pumping.   °HOW DO I PREPARE TO PUMP? °Your let-down reflex is the natural reaction to stimulation that makes your breast milk flow. It is easier to stimulate this reflex when you are relaxed. Find relaxation techniques that work for you. If you have difficulty with your let-down reflex, try these methods:  °· Smell one of your infant's blankets or an item of clothing.   °· Look at a picture or video of your infant.   °· Sit in a quiet, private space.   °· Massage the breast you plan to pump.   °· Place soothing warmth on the breast.   °· Play relaxing music.   °WHAT ARE SOME GENERAL BREAST PUMPING TIPS? °· Wash your hands before you pump. You  do not need to wash your nipples or breasts. °· There are three ways to pump. °¨ You can use your hand to massage and compress your breast. °¨ You can use a handheld manual pump. °¨ You can use an electric pump.   °· Make sure the suction cup (flange) on the breast pump is the right size. Place the flange directly over the nipple. If it is the wrong size or placed the wrong way, it may be painful and cause nipple damage.   °· If pumping is uncomfortable, apply a small amount of purified or modified lanolin to your nipple and areola. °· If you are using an electric pump, adjust the speed and suction power to be more comfortable. °· If pumping is painful or if you are not getting very much milk, you may need a different type of pump. A lactation consultant can help you determine what type of pump to use.   °· Keep a full water bottle near you at all times. Drinking lots of fluid helps you make more milk.  °· You can store your milk to use later. Pumped breast milk can be stored in a sealable, sterile container or plastic bag. Label all stored breast milk with the date you pumped it. °¨ Milk can stay out at room temperature for up to 8 hours. °¨   You can store your milk in the refrigerator for up to 8 days. °¨ You can store your milk in the freezer for 3 months. Thaw frozen milk using warm water. Do not put it in the microwave. °· Do not smoke. Smoking can lower your milk supply and harm your infant. If you need help quitting, ask your health care provider to recommend a program.   °WHEN SHOULD I CALL MY HEALTH CARE PROVIDER OR A LACTATION CONSULTANT? °· You are having trouble pumping. °· You are concerned that you are not making enough milk. °· You have nipple pain, soreness, or redness. °· You want to use birth control. Birth control pills may lower your milk supply. Talk to your health care provider about your options. °Document Released: 06/14/2009 Document Revised: 12/30/2012 Document Reviewed:  10/17/2012 °Exit[employer]® Patient Information ©2015 [employer]. This information is not intended to replace advice given to you by your health care provider. Make sure you discuss any questions you have with your health care provider. ° °Nutrition for the New Mother  °A new mother needs good health and nutrition so she can have energy to take care of a new baby. Whether a mother breastfeeds or formula feeds the baby, it is important to have a well-balanced diet. Foods from all the food groups should be chosen to meet the new mother's energy needs and to give her the nutrients needed for repair and healing.  °A HEALTHY EATING PLAN °The My [employer] plan for Moms outlines what you should eat to help you and your baby stay healthy. The energy and amount of food you need depends on whether or not you are breastfeeding. If you are breastfeeding you will need more nutrients. If you choose not to breastfeed, your nutrition goal should be to return to a healthy weight. Limiting calories may be needed if you are not breastfeeding.  °HOME CARE INSTRUCTIONS  °· For a personal plan based on your unique needs, see your Registered Dietitian or visit www.mypyramid.gov. °· Eat a variety of foods. The plan below will help guide you. The following chart has a suggested daily meal plan from the [employer] for Moms. °· Eat a variety of fruits and vegetables. °· Eat more dark green and orange vegetables and cooked dried beans. °· Make half your grains whole grains. Choose whole instead of refined grains. °· Choose low-fat or lean meats and poultry. °· Choose low-fat or fat-free dairy products like milk, cheese, or yogurt. °Fruits °· Breastfeeding: 2 cups °· Non-Breastfeeding: 2 cups °· What Counts as a serving? °¨ 1 cup of fruit or juice. °¨ ½ cup dried fruit. °Vegetables °· Breastfeeding: 3 cups °· Non-Breastfeeding: 2 ½ cups °· What Counts as a serving? °¨ 1 cup raw or cooked vegetables. °¨ Juice or 2 cups raw leafy  vegetables. °Grains °· Breastfeeding: 8 oz °· Non-Breastfeeding: 6 oz °· What Counts as a serving? °¨ 1 slice bread. °¨ 1 oz ready-to-eat cereal. °¨ ½ cup cooked pasta, rice, or cereal. °Meat and Beans °· Breastfeeding: 6 ½ oz °· Non-Breastfeeding: 5 ½ oz °· What Counts as a serving? °¨ 1 oz lean meat, poultry, or fish °¨ ¼ cup cooked dry beans °¨ ½ oz nuts or 1 egg °¨ 1 tbs peanut butter °Milk °· Breastfeeding: 3 cups °· Non-Breastfeeding: 3 cups °· What Counts as a serving? °¨ 1 cup milk. °¨ 8 oz yogurt. °¨ 1 ½ oz cheese. °¨ 2 oz processed cheese. °TIPS FOR THE BREASTFEEDING MOM °· Rapid weight   loss is not suggested when you are breastfeeding. By simply breastfeeding, you will be able to lose the weight gained during your pregnancy. Your caregiver can keep track of your weight and tell you if your weight loss is appropriate. °· Be sure to drink fluids. You may notice that you are thirstier than usual. A suggestion is to drink a glass of water or other beverage whenever you breastfeed. °· Avoid alcohol as it can be passed into your breast milk. °· Limit caffeine drinks to no more than 2 to 3 cups per day. °· You may need to keep taking your prenatal vitamin while you are breastfeeding. Talk with your caregiver about taking a vitamin or supplement. °RETURING TO A HEALTHY WEIGHT °· The My [employer] Plan for Moms will help you return to a healthy weight. It will also provide the nutrients you need. °· You may need to limit "empty" calories. These include: °¨ High fat foods like fried foods, fatty meats, fast food, butter, and mayonnaise. °¨ High sugar foods like sodas, jelly, candy, and sweets. °· Be physically active. Include 30 minutes of exercise or more each day. Choose an activity you like such as walking, swimming, biking, or aerobics. Check with your caregiver before you start to exercise. °Document Released: 04/03/2007 Document Revised: 03/19/2011 Document Reviewed: 04/03/2007 °ExitCare® Patient Information  ©2015 [employer]. This information is not intended to replace advice given to you by your health care provider. Make sure you discuss any questions you have with your health care provider. °Postpartum Depression and Baby Blues °The postpartum period begins right after the birth of a baby. During this time, there is often a great amount of joy and excitement. It is also a time of many changes in the life of the parents. Regardless of how many times a mother gives birth, each child brings new challenges and dynamics to the family. It is not unusual to have feelings of excitement along with confusing shifts in moods, emotions, and thoughts. All mothers are at risk of developing postpartum depression or the "baby blues." These mood changes can occur right after giving birth, or they may occur many months after giving birth. The baby blues or postpartum depression can be mild or severe. Additionally, postpartum depression can go away rather quickly, or it can be a long-term condition.  °CAUSES °Raised hormone levels and the rapid drop in those levels are thought to be a main cause of postpartum depression and the baby blues. A number of hormones change during and after pregnancy. Estrogen and progesterone usually decrease right after the delivery of your baby. The levels of thyroid hormone and various cortisol steroids also rapidly drop. Other factors that play a role in these mood changes include major life events and genetics.  °RISK FACTORS °If you have any of the following risks for the baby blues or postpartum depression, know what symptoms to watch out for during the postpartum period. Risk factors that may increase the likelihood of getting the baby blues or postpartum depression include: °· Having a personal or family history of depression.   °· Having depression while being pregnant.   °· Having premenstrual mood issues or mood issues related to oral contraceptives. °· Having a lot of life stress.   °· Having  marital conflict.   °· Lacking a social support network.   °· Having a baby with special needs.   °· Having health problems, such as diabetes.   °SIGNS AND SYMPTOMS °Symptoms of baby blues include: °· Brief changes in mood, such as going   from extreme happiness to sadness. °· Decreased concentration.   °· Difficulty sleeping.   °· Crying spells, tearfulness.   °· Irritability.   °· Anxiety.   °Symptoms of postpartum depression typically begin within the first month after giving birth. These symptoms include: °· Difficulty sleeping or excessive sleepiness.   °· Marked weight loss.   °· Agitation.   °· Feelings of worthlessness.   °· Lack of interest in activity or food.   °Postpartum psychosis is a very serious condition and can be dangerous. Fortunately, it is rare. Displaying any of the following symptoms is cause for immediate medical attention. Symptoms of postpartum psychosis include:  °· Hallucinations and delusions.   °· Bizarre or disorganized behavior.   °· Confusion or disorientation.   °DIAGNOSIS  °A diagnosis is made by an evaluation of your symptoms. There are no medical or lab tests that lead to a diagnosis, but there are various questionnaires that a health care provider may use to identify those with the baby blues, postpartum depression, or psychosis. Often, a screening tool called the Edinburgh Postnatal Depression Scale is used to diagnose depression in the postpartum period.  °TREATMENT °The baby blues usually goes away on its own in 1-2 weeks. Social support is often all that is needed. You will be encouraged to get adequate sleep and rest. Occasionally, you may be given medicines to help you sleep.  °Postpartum depression requires treatment because it can last several months or longer if it is not treated. Treatment may include individual or group therapy, medicine, or both to address any social, physiological, and psychological factors that may play a role in the depression. Regular exercise, a  healthy diet, rest, and social support may also be strongly recommended.  °Postpartum psychosis is more serious and needs treatment right away. Hospitalization is often needed. °HOME CARE INSTRUCTIONS °· Get as much rest as you can. Nap when the baby sleeps.   °· Exercise regularly. Some women find yoga and walking to be beneficial.   °· Eat a balanced and nourishing diet.   °· Do little things that you enjoy. Have a cup of tea, take a bubble bath, read your favorite magazine, or listen to your favorite music. °· Avoid alcohol.   °· Ask for help with household chores, cooking, grocery shopping, or running errands as needed. Do not try to do everything.   °· Talk to people close to you about how you are feeling. Get support from your partner, family members, friends, or other new moms. °· Try to stay positive in how you think. Think about the things you are grateful for.   °· Do not spend a lot of time alone.   °· Only take over-the-counter or prescription medicine as directed by your health care provider. °· Keep all your postpartum appointments.   °· Let your health care provider know if you have any concerns.   °SEEK MEDICAL CARE IF: °You are having a reaction to or problems with your medicine. °SEEK IMMEDIATE MEDICAL CARE IF: °· You have suicidal feelings.   °· You think you may harm the baby or someone else. °MAKE SURE YOU: °· Understand these instructions. °· Will watch your condition. °· Will get help right away if you are not doing well or get worse. °Document Released: 09/29/2003 Document Revised: 12/30/2012 Document Reviewed: 10/06/2012 °Exit[employer]® Patient Information ©2015 [employer]. This information is not intended to replace advice given to you by your health care provider. Make sure you discuss any questions you have with your health care provider. °Breastfeeding and Mastitis °Mastitis is inflammation of the breast tissue. It can occur in women who   are breastfeeding. This can make breastfeeding  painful. Mastitis will sometimes go away on its own. Your health care provider will help determine if treatment is needed. °CAUSES °Mastitis is often associated with a blocked milk (lactiferous) duct. This can happen when too much milk builds up in the breast. Causes of excess milk in the breast can include: °· Poor latch-on. If your baby is not latched onto the breast properly, she or he may not empty your breast completely while breastfeeding. °· Allowing too much time to pass between feedings. °· Wearing a bra or other clothing that is too tight. This puts extra pressure on the lactiferous ducts so milk does not flow through them as it should. °Mastitis can also be caused by a bacterial infection. Bacteria may enter the breast tissue through cuts or openings in the skin. In women who are breastfeeding, this may occur because of cracked or irritated skin. Cracks in the skin are often caused when your baby does not latch on properly to the breast. °SIGNS AND SYMPTOMS °· Swelling, redness, tenderness, and pain in an area of the breast. °· Swelling of the glands under the arm on the same side. °· Fever may or may not accompany mastitis. °If an infection is allowed to progress, a collection of pus (abscess) may develop. °DIAGNOSIS  °Your health care provider can usually diagnose mastitis based on your symptoms and a physical exam. Tests may be done to help confirm the diagnosis. These may include: °· Removal of pus from the breast by applying pressure to the area. This pus can be examined in the lab to determine which bacteria are present. If an abscess has developed, the fluid in the abscess can be removed with a needle. This can also be used to confirm the diagnosis and determine the bacteria present. In most cases, pus will not be present. °· Blood tests to determine if your body is fighting a bacterial infection. °· Mammogram or ultrasound tests to rule out other problems or diseases. °TREATMENT  °Mastitis that  occurs with breastfeeding will sometimes go away on its own. Your health care provider may choose to wait 24 hours after first seeing you to decide whether a prescription medicine is needed. If your symptoms are worse after 24 hours, your health care provider will likely prescribe an antibiotic medicine to treat the mastitis. He or she will determine which bacteria are most likely causing the infection and will then select an appropriate antibiotic medicine. This is sometimes changed based on the results of tests performed to identify the bacteria, or if there is no response to the antibiotic medicine selected. Antibiotic medicines are usually given by mouth. You may also be given medicine for pain. °HOME CARE INSTRUCTIONS °· Only take over-the-counter or prescription medicines for pain, fever, or discomfort as directed by your health care provider. °· If your health care provider prescribed an antibiotic medicine, take the medicine as directed. Make sure you finish it even if you start to feel better. °· Do not wear a tight or underwire bra. Wear a soft, supportive bra. °· Increase your fluid intake, especially if you have a fever. °· Continue to empty the breast. Your health care provider can tell you whether this milk is safe for your infant or needs to be thrown out. You may be told to stop nursing until your health care provider thinks it is safe for your baby. Use a breast pump if you are advised to stop nursing. °· Keep your nipples   clean and dry. °· Empty the first breast completely before going to the other breast. If your baby is not emptying your breasts completely for some reason, use a breast pump to empty your breasts. °· If you go back to work, pump your breasts while at work to stay in time with your nursing schedule. °· Avoid allowing your breasts to become overly filled with milk (engorged). °SEEK MEDICAL CARE IF: °· You have pus-like discharge from the breast. °· Your symptoms do not improve with  the treatment prescribed by your health care provider within 2 days. °SEEK IMMEDIATE MEDICAL CARE IF: °· Your pain and swelling are getting worse. °· You have pain that is not controlled with medicine. °· You have a red line extending from the breast toward your armpit. °· You have a fever or persistent symptoms for more than 2-3 days. °· You have a fever and your symptoms suddenly get worse. °MAKE SURE YOU:  °· Understand these instructions. °· Will watch your condition. °· Will get help right away if you are not doing well or get worse. °Document Released: 04/21/2004 Document Revised: 12/30/2012 Document Reviewed: 07/31/2012 °[employer]® Patient Information ©2015 [employer]. This information is not intended to replace advice given to you by your health care provider. Make sure you discuss any questions you have with your health care provider. °Breastfeeding °Deciding to breastfeed is one of the best choices you can make for you and your baby. A change in hormones during pregnancy causes your breast tissue to grow and increases the number and size of your milk ducts. These hormones also allow proteins, sugars, and fats from your blood supply to make breast milk in your milk-producing glands. Hormones prevent breast milk from being released before your baby is born as well as prompt milk flow after birth. Once breastfeeding has begun, thoughts of your baby, as well as his or her sucking or crying, can stimulate the release of milk from your milk-producing glands.  °BENEFITS OF BREASTFEEDING °For Your Baby °· Your first milk (colostrum) helps your baby's digestive system function better.   °· There are antibodies in your milk that help your baby fight off infections.   °· Your baby has a lower incidence of asthma, allergies, and sudden infant death syndrome.   °· The nutrients in breast milk are better for your baby than infant formulas and are designed uniquely for your baby's needs.   °· Breast milk improves your  baby's brain development.   °· Your baby is less likely to develop other conditions, such as childhood obesity, asthma, or type 2 diabetes mellitus.   °For You  °· Breastfeeding helps to create a very special bond between you and your baby.   °· Breastfeeding is convenient. Breast milk is always available at the correct temperature and costs nothing.   °· Breastfeeding helps to burn calories and helps you lose the weight gained during pregnancy.   °· Breastfeeding makes your uterus contract to its prepregnancy size faster and slows bleeding (lochia) after you give birth.   °· Breastfeeding helps to lower your risk of developing type 2 diabetes mellitus, osteoporosis, and breast or ovarian cancer later in life. °SIGNS THAT YOUR BABY IS HUNGRY °Early Signs of Hunger  °· Increased alertness or activity. °· Stretching. °· Movement of the head from side to side. °· Movement of the head and opening of the mouth when the corner of the mouth or cheek is stroked (rooting). °· Increased sucking sounds, smacking lips, cooing, sighing, or squeaking. °· Hand-to-mouth movements. °· Increased sucking of   fingers or hands. °Late Signs of Hunger °· Fussing. °· Intermittent crying. °Extreme Signs of Hunger °Signs of extreme hunger will require calming and consoling before your baby will be able to breastfeed successfully. Do not wait for the following signs of extreme hunger to occur before you initiate breastfeeding:   °· Restlessness. °· A loud, strong cry. °·  Screaming. °BREASTFEEDING BASICS °Breastfeeding Initiation °· Find a comfortable place to sit or lie down, with your neck and back well supported. °· Place a pillow or rolled up blanket under your baby to bring him or her to the level of your breast (if you are seated). Nursing pillows are specially designed to help support your arms and your baby while you breastfeed. °· Make sure that your baby's abdomen is facing your abdomen.   °· Gently massage your breast. With your  fingertips, massage from your chest wall toward your nipple in a circular motion. This encourages milk flow. You may need to continue this action during the feeding if your milk flows slowly. °· Support your breast with 4 fingers underneath and your thumb above your nipple. Make sure your fingers are well away from your nipple and your baby's mouth.   °· Stroke your baby's lips gently with your finger or nipple.   °· When your baby's mouth is open wide enough, quickly bring your baby to your breast, placing your entire nipple and as much of the colored area around your nipple (areola) as possible into your baby's mouth.   °¨ More areola should be visible above your baby's upper lip than below the lower lip.   °¨ Your baby's tongue should be between his or her lower gum and your breast.   °· Ensure that your baby's mouth is correctly positioned around your nipple (latched). Your baby's lips should create a seal on your breast and be turned out (everted). °· It is common for your baby to suck about 2-3 minutes in order to start the flow of breast milk. °Latching °Teaching your baby how to latch on to your breast properly is very important. An improper latch can cause nipple pain and decreased milk supply for you and poor weight gain in your baby. Also, if your baby is not latched onto your nipple properly, he or she may swallow some air during feeding. This can make your baby fussy. Burping your baby when you switch breasts during the feeding can help to get rid of the air. However, teaching your baby to latch on properly is still the best way to prevent fussiness from swallowing air while breastfeeding. °Signs that your baby has successfully latched on to your nipple:    °· Silent tugging or silent sucking, without causing you pain.   °· Swallowing heard between every 3-4 sucks.   °·  Muscle movement above and in front of his or her ears while sucking.   °Signs that your baby has not successfully latched on to  nipple:  °· Sucking sounds or smacking sounds from your baby while breastfeeding. °· Nipple pain. °If you think your baby has not latched on correctly, slip your finger into the corner of your baby's mouth to break the suction and place it between your baby's gums. Attempt breastfeeding initiation again. °Signs of Successful Breastfeeding °Signs from your baby:   °· A gradual decrease in the number of sucks or complete cessation of sucking.   °· Falling asleep.   °· Relaxation of his or her body.   °· Retention of a small amount of milk in his or her mouth.   °· Letting go   of your breast by himself or herself. °Signs from you: °· Breasts that have increased in firmness, weight, and size 1-3 hours after feeding.   °· Breasts that are softer immediately after breastfeeding. °· Increased milk volume, as well as a change in milk consistency and color by the fifth day of breastfeeding.   °· Nipples that are not sore, cracked, or bleeding. °Signs That Your Baby is Getting Enough Milk °· Wetting at least 3 diapers in a 24-hour period. The urine should be clear and pale yellow by age 5 days. °· At least 3 stools in a 24-hour period by age 5 days. The stool should be soft and yellow. °· At least 3 stools in a 24-hour period by age 7 days. The stool should be seedy and yellow. °· No loss of weight greater than 10% of birth weight during the first 3 days of age. °· Average weight gain of 4-7 ounces (113-198 g) per week after age 4 days. °· Consistent daily weight gain by age 5 days, without weight loss after the age of 2 weeks. °After a feeding, your baby may spit up a small amount. This is common. °BREASTFEEDING FREQUENCY AND DURATION °Frequent feeding will help you make more milk and can prevent sore nipples and breast engorgement. Breastfeed when you feel the need to reduce the fullness of your breasts or when your baby shows signs of hunger. This is called "breastfeeding on demand." Avoid introducing a pacifier to your  baby while you are working to establish breastfeeding (the first 4-6 weeks after your baby is born). After this time you may choose to use a pacifier. Research has shown that pacifier use during the first year of a baby's life decreases the risk of sudden infant death syndrome (SIDS). °Allow your baby to feed on each breast as long as he or she wants. Breastfeed until your baby is finished feeding. When your baby unlatches or falls asleep while feeding from the first breast, offer the second breast. Because newborns are often sleepy in the first few weeks of life, you may need to awaken your baby to get him or her to feed. °Breastfeeding times will vary from baby to baby. However, the following rules can serve as a guide to help you ensure that your baby is properly fed: °· Newborns (babies 4 weeks of age or younger) may breastfeed every 1-3 hours. °· Newborns should not go longer than 3 hours during the day or 5 hours during the night without breastfeeding. °· You should breastfeed your baby a minimum of 8 times in a 24-hour period until you begin to introduce solid foods to your baby at around 6 months of age. °BREAST MILK PUMPING °Pumping and storing breast milk allows you to ensure that your baby is exclusively fed your breast milk, even at times when you are unable to breastfeed. This is especially important if you are going back to work while you are still breastfeeding or when you are not able to be present during feedings. Your lactation consultant can give you guidelines on how long it is safe to store breast milk.  °A breast pump is a machine that allows you to pump milk from your breast into a sterile bottle. The pumped breast milk can then be stored in a refrigerator or freezer. Some breast pumps are operated by hand, while others use electricity. Ask your lactation consultant which type will work best for you. Breast pumps can be purchased, but some hospitals and breastfeeding support groups   lease  breast pumps on a monthly basis. A lactation consultant can teach you how to hand express breast milk, if you prefer not to use a pump.  °CARING FOR YOUR BREASTS WHILE YOU BREASTFEED °Nipples can become dry, cracked, and sore while breastfeeding. The following recommendations can help keep your breasts moisturized and healthy: °· Avoid using soap on your nipples.   °· Wear a supportive bra. Although not required, special nursing bras and tank tops are designed to allow access to your breasts for breastfeeding without taking off your entire bra or top. Avoid wearing underwire-style bras or extremely tight bras. °· Air dry your nipples for 3-4 minutes after each feeding.   °· Use only cotton bra pads to absorb leaked breast milk. Leaking of breast milk between feedings is normal.   °· Use lanolin on your nipples after breastfeeding. Lanolin helps to maintain your skin's normal moisture barrier. If you use pure lanolin, you do not need to wash it off before feeding your baby again. Pure lanolin is not toxic to your baby. You may also hand express a few drops of breast milk and gently massage that milk into your nipples and allow the milk to air dry. °In the first few weeks after giving birth, some women experience extremely full breasts (engorgement). Engorgement can make your breasts feel heavy, warm, and tender to the touch. Engorgement peaks within 3-5 days after you give birth. The following recommendations can help ease engorgement: °· Completely empty your breasts while breastfeeding or pumping. You may want to start by applying warm, moist heat (in the shower or with warm water-soaked hand towels) just before feeding or pumping. This increases circulation and helps the milk flow. If your baby does not completely empty your breasts while breastfeeding, pump any extra milk after he or she is finished. °· Wear a snug bra (nursing or regular) or tank top for 1-2 days to signal your body to slightly decrease milk  production. °· Apply ice packs to your breasts, unless this is too uncomfortable for you. °· Make sure that your baby is latched on and positioned properly while breastfeeding. °If engorgement persists after 48 hours of following these recommendations, contact your health care provider or a lactation consultant. °OVERALL HEALTH CARE RECOMMENDATIONS WHILE BREASTFEEDING °· Eat healthy foods. Alternate between meals and snacks, eating 3 of each per day. Because what you eat affects your breast milk, some of the foods may make your baby more irritable than usual. Avoid eating these foods if you are sure that they are negatively affecting your baby. °· Drink milk, fruit juice, and water to satisfy your thirst (about 10 glasses a day).   °· Rest often, relax, and continue to take your prenatal vitamins to prevent fatigue, stress, and anemia. °· Continue breast self-awareness checks. °· Avoid chewing and smoking tobacco. °· Avoid alcohol and drug use. °Some medicines that may be harmful to your baby can pass through breast milk. It is important to ask your health care provider before taking any medicine, including all over-the-counter and prescription medicine as well as vitamin and herbal supplements. °It is possible to become pregnant while breastfeeding. If birth control is desired, ask your health care provider about options that will be safe for your baby. °SEEK MEDICAL CARE IF:  °· You feel like you want to stop breastfeeding or have become frustrated with breastfeeding. °· You have painful breasts or nipples. °· Your nipples are cracked or bleeding. °· Your breasts are red, tender, or warm. °· You have   a swollen area on either breast. °· You have a fever or chills. °· You have nausea or vomiting. °· You have drainage other than breast milk from your nipples. °· Your breasts do not become full before feedings by the fifth day after you give birth. °· You feel sad and depressed. °· Your baby is too sleepy to eat  well. °· Your baby is having trouble sleeping.   °· Your baby is wetting less than 3 diapers in a 24-hour period. °· Your baby has less than 3 stools in a 24-hour period. °· Your baby's skin or the white part of his or her eyes becomes yellow.   °· Your baby is not gaining weight by 5 days of age. °SEEK IMMEDIATE MEDICAL CARE IF:  °· Your baby is overly tired (lethargic) and does not want to wake up and feed. °· Your baby develops an unexplained fever. °Document Released: 12/25/2004 Document Revised: 12/30/2012 Document Reviewed: 06/18/2012 °ExitCare® Patient Information ©2015 [employer]. This information is not intended to replace advice given to you by your health care provider. Make sure you discuss any questions you have with your health care provider. ° °

## 2013-10-19 NOTE — Discharge Summary (Signed)
POSTOPERATIVE DISCHARGE SUMMARY:  Patient ID: Emma Franco MRN: [id number] DOB/AGE: 34-Mar-1982 34 y.o.  Admit date: 10/16/2013 Admission Diagnoses: Repeat Cesarean Delivery   Discharge date:  10/19/2013 Discharge Diagnoses: S/P Repeat C/S due to thinning of lower uterine segment on 109/2015        Prenatal history: [id number]   EDC : 10/26/2013, ultrasound Has received prenatal care at [facility] since 9.[redacted] wks gestation. Primary provider : Dr. Pamala Hurry Prenatal course complicated by previous C/S, thinning of lower uterine segment, H/O MAB  Prenatal Labs: ABO, Rh: B POS (10/08 1120)  Antibody: NEG (10/08 1120) Rubella:  Immune  RPR: NON REAC (10/08 1120)  HBsAg:  NON REAC HIV:  NON REAC  GTT : normal GBS:  Negative  Medical / Surgical History :  Past medical history:  Past Medical History  Diagnosis Date  . Esophageal reflux   . Hiatal hernia   . Irritable bowel syndrome   . PONV (postoperative nausea and vomiting)   . Sinusitis, chronic   . Environmental allergies   . Headache(784.0) 07/18/11    "qd for the last year; CSF leak; repaired today"  . Migraines     "once in a blue moon"  . Arthritis     "in my back"  . Scoliosis     mild  . Postpartum care following cesarean delivery (10/9) 10/16/2013    Past surgical history:  Past Surgical History  Procedure Laterality Date  . Cesarean section  03/2009  . Nasal sinus surgery  03/2010; 07/18/11  . Nasal septum surgery  2006  . Repair dural / csf leak  07/18/11  . Tonsillectomy  1990  . Cholecystectomy  12/1996  . Sinus endo w/fusion  07/18/2011    Procedure: ENDOSCOPIC SINUS SURGERY WITH FUSION NAVIGATION;  Surgeon: Ruby Cola, MD;  Location: [facility] OR;  Service: ENT;  Laterality: N/A;     Allergies: Ciprofloxacin   Physical Exam:   VSS: Blood pressure 103/74, pulse 75, temperature 97.1 F (36.2 C), temperature source Oral, resp. rate 16, weight 83.065 kg (183 lb 2 oz), SpO2 99.00%,  currently breastfeeding.  LABS:  Recent Labs  10/17/13 0616  WBC 15.4*  HGB 10.4*  PLT 153    Newborn Data Live born female on 10/16/2013 Birth Weight: 6 lb 12.5 oz (3075 g) APGAR: 8, 9  See operative report for further details  Home with mother.  Discharge Instructions:  Wound Care: keep clean and dry / remove honeycomb POD 7 Postpartum Instructions: [facility] discharge booklet - instructions reviewed Medications:    Medication List    ASK your doctor about these medications       fluticasone 50 MCG/ACT nasal spray  Commonly known as:  FLONASE  Place 2 sprays into the nose daily.     levocetirizine 5 MG tablet  Commonly known as:  XYZAL  Take 5 mg by mouth every evening.     mupirocin nasal ointment 2 %  Commonly known as:  BACTROBAN  Place 1 application into the nose daily. Mix 22 gram tube with 1 liter of Normal Saline to create irrigation.  Rinse each nostril with 50 ml daily.     prenatal multivitamin Tabs tablet  Take 1 tablet by mouth daily at 12 noon.     RABEprazole 20 MG tablet  Commonly known as:  ACIPHEX  Take 20 mg by mouth daily.           Follow-up Information   Follow up with [facility] A.,  MD. Schedule an appointment as soon as possible for a visit in 6 weeks. (postpartum visit)    Specialty:  Obstetrics and Gynecology   Contact information:   [place] [address] [phone]         Signed: Graceann Congress, MSN, CNM 10/19/2013, 10:56 AM

## 2013-10-19 NOTE — Progress Notes (Signed)
POD # 3  Subjective: Pt reports feeling well/ Pain controlled with Motrin and Percocet Tolerating po/Voiding without problems/ No n/v/ Flatus present. No BM. Activity: ad lib Bleeding is light Newborn info:  Information for the patient's newborn:  Shailyn, [place] Girl Delaila Nand [[id number]]  female  Feeding: breast, working with nipple shield   Objective: VS:  Filed Vitals:   10/17/13 1730 10/18/13 0530 10/18/13 1845 10/19/13 0613  BP: 109/66 104/78 118/75 103/74  Pulse: 82 75 89 75  Temp: 97.9 F (36.6 C) 97.5 F (36.4 C) 98.2 F (36.8 C) 97.1 F (36.2 C)  TempSrc: Oral Oral Oral Oral  Resp: 16 18 18 16   Weight:      SpO2: 97%   99%    I&O: Intake/Output     10/11 0701 - 10/12 0700 10/12 0701 - 10/13 0700   P.O.     Total Intake(mL/kg)     Urine (mL/kg/hr)     Total Output       Net              LABS:   Recent Labs  10/17/13 0616  WBC 15.4*  HGB 10.4*  HCT 30.2*  PLT 153    Blood type: B POS (10/08 1120) Rubella:  Immune    Physical Exam:  General: alert and cooperative CV: Regular rate and rhythm Resp: CTA bilaterally Abdomen: soft, nontender, normal bowel sounds Uterine Fundus: firm, 3 FB below umbilicus, nontender Incision: Covered with Tegaderm and honeycomb dressing; no significant drainage, edema, bruising, or erythema; well approximated with suture Lochia: minimal Ext: extremities normal, atraumatic, no cyanosis or edema and Homans sign is negative, no sign of DVT    Assessment/: POD # 3 / [id number] / S/P C/Section d/t repeat with BTL Post partum care following cesarean delivery (10/9)  Doing well  Plan: Continue routine post op orders D/C Home today   Signed: Graceann Congress, MSN, CNM 10/19/2013, 10:31 AM

## 2013-11-09 ENCOUNTER — Encounter (HOSPITAL_COMMUNITY): Payer: Self-pay | Admitting: Obstetrics

## 2015-03-17 ENCOUNTER — Ambulatory Visit: Payer: 59 | Attending: Otolaryngology | Admitting: Physical Therapy

## 2015-03-17 DIAGNOSIS — H8111 Benign paroxysmal vertigo, right ear: Secondary | ICD-10-CM | POA: Diagnosis not present

## 2015-03-19 NOTE — Therapy (Signed)
[facility] [address] [place], [place], [address] Phone: [phone]   Fax:  [phone]  Physical Therapy Evaluation  Patient Details  Name: Emma Franco MRN: [id number] Date of Birth: Jul 02, 1980 Referring Provider: Dr. Ruby Cola  Encounter Date: 03/17/2015      PT End of Session - 03/19/15 1038    Visit Number 1   Number of Visits 4   Date for PT Re-Evaluation 04/17/15   PT Start Time [id number]   PT Stop Time 1410   PT Time Calculation (min) 55 min      Past Medical History  Diagnosis Date  . Esophageal reflux   . Hiatal hernia   . Irritable bowel syndrome   . PONV (postoperative nausea and vomiting)   . Sinusitis, chronic   . Environmental allergies   . Headache(784.0) 07/18/11    "qd for the last year; CSF leak; repaired today"  . Migraines     "once in a blue moon"  . Arthritis     "in my back"  . Scoliosis     mild  . Postpartum care following cesarean delivery (10/9) 10/16/2013    Past Surgical History  Procedure Laterality Date  . Cesarean section  03/2009  . Nasal sinus surgery  03/2010; 07/18/11  . Nasal septum surgery  2006  . Repair dural / csf leak  07/18/11  . Tonsillectomy  1990  . Cholecystectomy  12/1996  . Sinus endo w/fusion  07/18/2011    Procedure: ENDOSCOPIC SINUS SURGERY WITH FUSION NAVIGATION;  Surgeon: Ruby Cola, MD;  Location: [facility] OR;  Service: ENT;  Laterality: N/A;  . Cesarean section Bilateral 10/16/2013    Procedure: REPEAT CESAREAN SECTION WITH BILATERAL TUBAL LIGATION ;  Surgeon: Floyce Stakes. Pamala Hurry, MD;  Location: [facility];  Service: Obstetrics;  Laterality: Bilateral;   EDD: 10/26/13    There were no vitals filed for this visit.  Visit Diagnosis:  BPPV (benign paroxysmal positional vertigo), right - Plan: PT plan of care cert/re-cert      Subjective Assessment - 03/19/15 1014    Subjective Pt reports she awoke with vertigo this morning when she rolled over  onto right side to get out of bed; has nauseau - denies vomiting    Pertinent History sinus sx in March 2012, had cerebral spinal fluid leak that was not diagnosed until over a year later - had sinus surgery again in July 2013 and surgery to repair CSF leak; Pt reports vertigo in Jan. 2017 which lasted for a few weeks   Patient Stated Goals resolve the vertigo   Currently in Pain? No/denies            [facility] PT Assessment - 03/19/15 0001    Assessment   Medical Diagnosis Vertigo   Referring Provider Dr. Ruby Cola   Onset Date/Surgical Date 03/17/15   Prior Therapy --  3 years ago   Precautions   Precautions Other (comment)  Vertigo   Balance Screen   Has the patient fallen in the past 6 months No   Has the patient had a decrease in activity level because of a fear of falling?  No   Is the patient reluctant to leave their home because of a fear of falling?  No   Prior Function   Level of Independence Independent   Ambulation/Gait   Ambulation/Gait Yes   Ambulation/Gait Assistance 7: Independent   Ambulation Distance (Feet) 100 Feet   Assistive device None  Gait Pattern Within Functional Limits   Ambulation Surface Level;Indoor            Vestibular Assessment - 03/19/15 0001    Vestibular Assessment   General Observation Pt is a 35 year old female with c/o severe vertigo that just started this mroning when she awoke and turned over onto R side   Symptom Behavior   Type of Dizziness Spinning   Occulomotor Exam   Occulomotor Alignment Normal   Positional Testing   Dix-Hallpike Dix-Hallpike Right;Dix-Hallpike Left   Sidelying Test Sidelying Right   Dix-Hallpike Right   Dix-Hallpike Right Duration approx. 10 secs   Dix-Hallpike Left   Dix-Hallpike Left Duration none     Epley maneuver for R BPPV completed 1 rep;  Attempted 2 additional reps but pt became very dizzy in position 2 of Epley's and had to  sit upright- was unable to cont treatment due to c/o nauseau  and vomiting   Pt had L beating nystagmus in position 2 of Epley's on 2nd and 3rd attempt - did not occur on 1st rep   Instructed friend in performance of Epley maneuver for R BPPV for self treatment at home as able to tolerate                 PT Education - 03/19/15 1037    Education provided Yes   Education Details see pt instructions   Person(s) Educated Patient   Methods Explanation;Demonstration;Handout   Comprehension Verbalized understanding             PT Long Term Goals - 03/19/15 1118    PT LONG TERM GOAL #1   Title Pt will have  (-) R Dix-Hallpike test to indicate that R BPPV has resolved.  (04-17-15)   Time 4   Period Weeks   Status New   PT LONG TERM GOAL #2   Title Independent in HEP for self treatment of R BPPV - verbalize understanding of Epley's and Brandt-Daroff.  (04-17-15)   Time 4   Period Weeks   Status New               Plan - 03/19/15 1042    Clinical Impression Statement Pt is a 35 year old lady with signs and symptoms consistent with R BPPV; unable to tolerate more than 1 rep of Epley due to severe vertigo provoked in 2nd position with head turned toward L side - pt became nauseaus - had  vom   Pt will benefit from skilled therapeutic intervention in order to improve on the following deficits Dizziness;Difficulty walking;Decreased balance   Rehab Potential Good   PT Frequency 1x / week   PT Duration 4 weeks   PT Treatment/Interventions ADLs/Self Care Home Management;Vestibular;Neuromuscular re-education;Canalith Repostioning;Patient/family education   PT Next Visit Plan recheck R Dix-Hallpike - cont Epley maneuver   PT Home Exercise Plan Epley maneuver for self treatment   Consulted and Agree with Plan of Care Patient         Problem List Patient Active Problem List   Diagnosis Date Noted  . Previous cesarean delivery, delivered 10/19/2013  . Acute blood loss anemia 10/17/2013  . Postpartum care following cesarean  delivery (10/9) 10/16/2013  . Gastroenteritis 09/11/2013  . ESOPHAGITIS 03/18/2008  . HIATAL HERNIA 03/18/2008  . GERD 02/16/2008  . IRRITABLE BOWEL SYNDROME 02/06/2008  . ARTHRITIS 02/06/2008  . ABDOMINAL PAIN-EPIGASTRIC 02/06/2008    DildayJenness Corner, PT 03/19/2015, 11:23 AM  [facility] [phone]  [place], [place], [address] Phone: [phone]   Fax:  [phone]  Name: Emma Franco MRN: [id number] Date of Birth: 01-Dec-1980

## 2015-03-19 NOTE — Patient Instructions (Addendum)
Self Treatment for Right Posterior / Anterior Canalithiasis    Sitting on bed: 1. Turn head 45 right. (a) Lie back slowly, shoulders on pillow, head on bed. (b) Hold __30__ seconds. 2. Keeping head on bed, turn head 90 left. Hold _30___ seconds. 3. Roll to left, head on 45 angle down toward bed. Hold _30_ seconds. 4. Sit up on left side of bed. Repeat __3__ times per session. Do _2-3___ sessions per day.  Copyright  VHI. All rights reserved.  Benign Positional Vertigo Vertigo is the feeling that you or your surroundings are moving when they are not. Benign positional vertigo is the most common form of vertigo. The cause of this condition is not serious (is benign). This condition is triggered by certain movements and positions (is positional). This condition can be dangerous if it occurs while you are doing something that could endanger you or others, such as driving.  CAUSES In many cases, the cause of this condition is not known. It may be caused by a disturbance in an area of the inner ear that helps your brain to sense movement and balance. This disturbance can be caused by a viral infection (labyrinthitis), head injury, or repetitive motion. RISK FACTORS This condition is more likely to develop in:  Women.  People who are 38 years of age or older. SYMPTOMS Symptoms of this condition usually happen when you move your head or your eyes in different directions. Symptoms may start suddenly, and they usually last for less than a minute. Symptoms may include:  Loss of balance and falling.  Feeling like you are spinning or moving.  Feeling like your surroundings are spinning or moving.  Nausea and vomiting.  Blurred vision.  Dizziness.  Involuntary eye movement (nystagmus). Symptoms can be mild and cause only slight annoyance, or they can be severe and interfere with daily life. Episodes of benign positional vertigo may return (recur) over time, and they may be triggered by  certain movements. Symptoms may improve over time. DIAGNOSIS This condition is usually diagnosed by medical history and a physical exam of the head, neck, and ears. You may be referred to a health care provider who specializes in ear, nose, and throat (ENT) problems (otolaryngologist) or a provider who specializes in disorders of the nervous system (neurologist). You may have additional testing, including:  MRI.  A CT scan.  Eye movement tests. Your health care provider may ask you to change positions quickly while he or she watches you for symptoms of benign positional vertigo, such as nystagmus. Eye movement may be tested with an electronystagmogram (ENG), caloric stimulation, the Dix-Hallpike test, or the roll test.  An electroencephalogram (EEG). This records electrical activity in your brain.  Hearing tests. TREATMENT Usually, your health care provider will treat this by moving your head in specific positions to adjust your inner ear back to normal. Surgery may be needed in severe cases, but this is rare. In some cases, benign positional vertigo may resolve on its own in 2-4 weeks. HOME CARE INSTRUCTIONS Safety  Move slowly.Avoid sudden body or head movements.  Avoid driving.  Avoid operating heavy machinery.  Avoid doing any tasks that would be dangerous to you or others if a vertigo episode would occur.  If you have trouble walking or keeping your balance, try using a cane for stability. If you feel dizzy or unstable, sit down right away.  Return to your normal activities as told by your health care provider. Ask your health care provider what  activities are safe for you. General Instructions  Take over-the-counter and prescription medicines only as told by your health care provider.  Avoid certain positions or movements as told by your health care provider.  Drink enough fluid to keep your urine clear or pale yellow.  Keep all follow-up visits as told by your health care  provider. This is important. SEEK MEDICAL CARE IF:  You have a fever.  Your condition gets worse or you develop new symptoms.  Your family or friends notice any behavioral changes.  Your nausea or vomiting gets worse.  You have numbness or a "pins and needles" sensation. SEEK IMMEDIATE MEDICAL CARE IF:  You have difficulty speaking or moving.  You are always dizzy.  You faint.  You develop severe headaches.  You have weakness in your legs or arms.  You have changes in your hearing or vision.  You develop a stiff neck.  You develop sensitivity to light.   This information is not intended to replace advice given to you by your health care provider. Make sure you discuss any questions you have with your health care provider.   Document Released: 10/02/2005 Document Revised: 09/15/2014 Document Reviewed: 04/19/2014 Elsevier Interactive Patient Education [employer].

## 2015-05-24 ENCOUNTER — Encounter: Payer: Self-pay | Admitting: Gastroenterology

## 2016-01-05 ENCOUNTER — Ambulatory Visit (INDEPENDENT_AMBULATORY_CARE_PROVIDER_SITE_OTHER): Payer: Self-pay | Admitting: Family

## 2016-01-05 VITALS — BP 120/90 | HR 100 | Temp 99.4°F | Wt 159.6 lb

## 2016-01-05 DIAGNOSIS — B9689 Other specified bacterial agents as the cause of diseases classified elsewhere: Secondary | ICD-10-CM

## 2016-01-05 DIAGNOSIS — R05 Cough: Secondary | ICD-10-CM

## 2016-01-05 DIAGNOSIS — R059 Cough, unspecified: Secondary | ICD-10-CM

## 2016-01-05 DIAGNOSIS — J329 Chronic sinusitis, unspecified: Secondary | ICD-10-CM

## 2016-01-05 MED ORDER — AMOXICILLIN-POT CLAVULANATE 875-125 MG PO TABS: 1.0000 | ORAL_TABLET | Freq: Two times a day (BID) | ORAL | 0 refills | Status: DC

## 2016-01-05 MED ORDER — HYDROCOD POLST-CPM POLST ER 10-8 MG/5ML PO SUER: 5.0000 mL | Freq: Two times a day (BID) | ORAL | Status: DC | PRN

## 2016-01-05 NOTE — Progress Notes (Signed)
Subjective:     Patient ID: Emma Franco, female   DOB: 16-Jul-1980, 35 y.o.   MRN: [id number]  HPI Patient is in today with c/o cough, congestion, sinus pressure and pain x 2 weeks off and on but worsening over the last 3 days. Has been taking Sudafed, Flonase, Nyquil without much relief. Daughter home sick with bacterial PNA. Reports cough productive with green to brown phlegm.   Review of Systems  Constitutional: Positive for fatigue.  HENT: Positive for congestion, postnasal drip, sinus pain, sinus pressure, sneezing and sore throat.   Respiratory: Positive for cough.   Cardiovascular: Negative.   Gastrointestinal: Negative.   Endocrine: Negative.   Musculoskeletal: Negative.   Skin: Negative.   Allergic/Immunologic: Positive for environmental allergies.  Psychiatric/Behavioral: Negative.    Past Medical History:  Diagnosis Date  . 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 Environmental allergies   . Esophageal reflux   . Headache(784.0) 07/18/11   "qd for the last year; CSF leak; repaired today"  . Hiatal hernia   . Irritable bowel syndrome   . Migraines    "once in a blue moon"  . PONV (postoperative nausea and vomiting)   . Postpartum care following cesarean delivery (10/9) 10/16/2013  . Scoliosis    mild  . Sinusitis, chronic     Social History   Social History  . Marital status: Married    Spouse name: N/A  . Number of children: 1  . Years of education: N/A   Occupational History  . [place] History Main Topics  . Smoking status: Never Smoker  . Smokeless tobacco: Never Used  . Alcohol use No  . Drug use: No  . Sexual activity: Yes    Birth control/ protection: None   Other Topics Concern  . Not on file   Social History Narrative  . No narrative on file    Past Surgical History:  Procedure Laterality Date  . CESAREAN SECTION  03/2009  . CESAREAN SECTION Bilateral 10/16/2013   Procedure: REPEAT CESAREAN SECTION WITH BILATERAL  TUBAL LIGATION ;  Surgeon: Claiborne Billings A. Pamala Hurry, MD;  Location: [facility];  Service: Obstetrics;  Laterality: Bilateral;   EDD: 10/26/13  . CHOLECYSTECTOMY  12/1996  . NASAL SEPTUM SURGERY  2006  . NASAL SINUS SURGERY  03/2010; 07/18/11  . REPAIR DURAL / CSF LEAK  07/18/11  . SINUS ENDO W/FUSION  07/18/2011   Procedure: ENDOSCOPIC SINUS SURGERY WITH FUSION NAVIGATION;  Surgeon: Ruby Cola, MD;  Location: [facility];  Service: ENT;  Laterality: N/A;  . TONSILLECTOMY  1990    Family History  Problem Relation Age of Onset  . Stroke Maternal Grandmother   . Stroke Paternal Grandmother   . Rheum arthritis Mother   . Multiple sclerosis Mother     Allergies  Allergen Reactions  . Ciprofloxacin Nausea And Vomiting    Current Outpatient Prescriptions on File Prior to Visit  Medication Sig Dispense Refill  . ibuprofen (ADVIL,MOTRIN) 600 MG tablet Take 1 tablet (600 mg total) by mouth every 6 (six) hours. (Patient not taking: Reported on 01/05/2016) 30 tablet 0  . iron polysaccharides (NIFEREX) 150 MG capsule Take 1 capsule (150 mg total) by mouth daily. (Patient not taking: Reported on 01/05/2016) 30 capsule 3  . levocetirizine (XYZAL) 5 MG tablet Take 5 mg by mouth every evening.    . Prenatal Vit-Fe Fumarate-FA (PRENATAL MULTIVITAMIN) TABS tablet Take 1 tablet by mouth daily at 12 noon.    Marland Kitchen  RABEprazole (ACIPHEX) 20 MG tablet Take 20 mg by mouth daily.     No current facility-administered medications on file prior to visit.     BP 120/90   Pulse 100   Temp 99.4 F (37.4 C) (Oral)   Wt 159 lb 9.6 oz (72.4 kg)   LMP 01/05/2016 (Exact Date)   SpO2 100%   BMI 25.76 kg/m chart    Objective:   Physical Exam  Constitutional: She is oriented to person, place, and time. She appears well-developed and well-nourished.  HENT:  Head:    Right Ear: External ear normal.  Left Ear: External ear normal.  Nose: Right sinus exhibits maxillary sinus tenderness. Left sinus exhibits maxillary sinus  tenderness.  Mouth/Throat: Posterior oropharyngeal erythema present.  Eyes: Conjunctivae are normal.  Neck: Normal range of motion. Neck supple.  Cardiovascular: Normal rate, regular rhythm and normal heart sounds.   Pulmonary/Chest: Effort normal and breath sounds normal.  Musculoskeletal: Normal range of motion.  Neurological: She is alert and oriented to person, place, and time.  Skin: Skin is warm and dry.  Psychiatric: She has a normal mood and affect.       Assessment:     1. Acute Bacterial Sinusitis 2. Cough    Plan:     Augmentin 875mg  twice daily x 7 days. Flonase 2 sprays in each nostril once a day. Tussionex as requested twice daily for cough. Do not take medication and drive/work. Call the office with any questions or concerns.

## 2016-01-05 NOTE — Patient Instructions (Signed)

## 2016-01-31 ENCOUNTER — Ambulatory Visit (INDEPENDENT_AMBULATORY_CARE_PROVIDER_SITE_OTHER): Payer: Self-pay | Admitting: Nurse Practitioner

## 2016-01-31 VITALS — BP 110/80 | HR 99 | Temp 99.1°F

## 2016-01-31 DIAGNOSIS — R6889 Other general symptoms and signs: Secondary | ICD-10-CM

## 2016-01-31 DIAGNOSIS — J019 Acute sinusitis, unspecified: Secondary | ICD-10-CM

## 2016-01-31 DIAGNOSIS — J111 Influenza due to unidentified influenza virus with other respiratory manifestations: Secondary | ICD-10-CM

## 2016-01-31 LAB — POCT INFLUENZA A/B
INFLUENZA B, POC: POSITIVE — AB
Influenza A, POC: POSITIVE — AB

## 2016-01-31 MED ORDER — METHYLPREDNISOLONE ACETATE 80 MG/ML IJ SUSP
80.0000 mg | Freq: Once | INTRAMUSCULAR | Status: AC
  Administered 2016-01-31: 80 mg via INTRAMUSCULAR

## 2016-01-31 MED ORDER — FLUCONAZOLE 150 MG PO TABS: 150.0000 mg | ORAL_TABLET | Freq: Once | ORAL | 1 refills | Status: AC

## 2016-01-31 MED ORDER — CEFDINIR 300 MG PO CAPS: 300.0000 mg | ORAL_CAPSULE | Freq: Two times a day (BID) | ORAL | 0 refills | Status: DC

## 2016-01-31 MED ORDER — HYDROCODONE-HOMATROPINE 5-1.5 MG/5ML PO SYRP: 5.0000 mL | ORAL_SOLUTION | Freq: Four times a day (QID) | ORAL | 0 refills | Status: AC | PRN

## 2016-01-31 NOTE — Progress Notes (Signed)
Subjective:     Emma Franco is a 36 y.o. female who presents for evaluation of symptoms of a URI. Symptoms include achiness, fever and headache, nasal congestion, productive cough with  yellow and greenish-brown colored sputum and sore throat. Onset of symptoms was 3 days ago, and has been gradually worsening since that time. Treatment to date: OTC medications.  The following portions of the patient's history were reviewed and updated as appropriate: allergies, current medications and past medical history.  Review of Systems Constitutional: positive for chills, fatigue, fevers, malaise, sweats and achiness Eyes: negative Ears, nose, mouth, throat, and face: positive for ear pressure Respiratory: positive for cough and productive with yellowish-green sputum Gastrointestinal: positive for decreased appetite Neurological: positive for headaches Allergic/Immunologic: positive for hay fever   Objective:    BP 110/80   Pulse 99   Temp 99.1 F (37.3 C) (Oral)   LMP 01/05/2016 (Exact Date)   SpO2 99%  General appearance: alert, cooperative, fatigued and mild distress Head: Normocephalic, without obvious abnormality, atraumatic, sinuses tender to percussion Eyes: conjunctivae/corneas clear. PERRL, EOM's intact. Fundi benign. Ears: normal TM's and external ear canals both ears Nose: yellow discharge, moderate congestion, turbinates red, swollen, moderate maxillary sinus tenderness bilateral Throat: lips, mucosa, and tongue normal; teeth and gums normal Lungs: clear to auscultation bilaterally Heart: regular rate and rhythm, S1, S2 normal, no murmur, click, rub or gallop Abdomen: soft, non-tender; bowel sounds normal; no masses,  no organomegaly Neurologic: Alert and oriented X 3, normal strength and tone. Normal symmetric reflexes. Normal coordination and gait   Assessment:    influenza and sinusitis   Plan:    Discussed the diagnosis and treatment of sinusitis. Suggested  symptomatic OTC remedies. Nasal saline spray for congestion. Cefdinir per orders. Call in 3 days if symptoms aren't resolving.

## 2016-01-31 NOTE — Patient Instructions (Addendum)
Influenza, Adult Influenza, more commonly known as "the flu," is a viral infection that primarily affects the respiratory tract. The respiratory tract includes organs that help you breathe, such as the lungs, nose, and throat. The flu causes many common cold symptoms, as well as a high fever and body aches. The flu spreads easily from person to person (is contagious). Getting a flu shot (influenza vaccination) every year is the best way to prevent influenza. What are the causes? Influenza is caused by a virus. You can catch the virus by:  Breathing in droplets from an infected person's cough or sneeze.  Touching something that was recently contaminated with the virus and then touching your mouth, nose, or eyes.  What increases the risk? The following factors may make you more likely to get the flu:  Not cleaning your hands frequently with soap and water or alcohol-based hand sanitizer.  Having close contact with many people during cold and flu season.  Touching your mouth, eyes, or nose without washing or sanitizing your hands first.  Not drinking enough fluids or not eating a healthy diet.  Not getting enough sleep or exercise.  Being under a high amount of stress.  Not getting a yearly (annual) flu shot.  You may be at a higher risk of complications from the flu, such as a severe lung infection (pneumonia), if you:  Are over the age of 65.  Are pregnant.  Have a weakened disease-fighting system (immune system). You may have a weakened immune system if you: ? Have HIV or AIDS. ? Are undergoing chemotherapy. ? Aretaking medicines that reduce the activity of (suppress) the immune system.  Have a long-term (chronic) illness, such as heart disease, kidney disease, diabetes, or lung disease.  Have a liver disorder.  Are obese.  Have anemia.  What are the signs or symptoms? Symptoms of this condition typically last 4-10 days and may  include:  Fever.  Chills.  Headache, body aches, or muscle aches.  Sore throat.  Cough.  Runny or congested nose.  Chest discomfort and cough.  Poor appetite.  Weakness or tiredness (fatigue).  Dizziness.  Nausea or vomiting.  How is this diagnosed? This condition may be diagnosed based on your medical history and a physical exam. Your health care provider may do a nose or throat swab test to confirm the diagnosis. How is this treated? If influenza is detected early, you can be treated with antiviral medicine that can reduce the length of your illness and the severity of your symptoms. This medicine may be given by mouth (orally) or through an IV tube that is inserted in one of your veins. The goal of treatment is to relieve symptoms by taking care of yourself at home. This may include taking over-the-counter medicines, drinking plenty of fluids, and adding humidity to the air in your home. In some cases, influenza goes away on its own. Severe influenza or complications from influenza may be treated in a hospital. Follow these instructions at home:  Take over-the-counter and prescription medicines only as told by your health care provider.  Use a cool mist humidifier to add humidity to the air in your home. This can make breathing easier.  Rest as needed.  Drink enough fluid to keep your urine clear or pale yellow.  Cover your mouth and nose when you cough or sneeze.  Wash your hands with soap and water often, especially after you cough or sneeze. If soap and water are not available, use hand sanitizer.    work or school as told by your health care provider. Unless you are visiting your health care provider, try to avoid leaving home until your fever has been gone for 24 hours without the use of medicine.  Keep all follow-up visits as told by your health care provider. This is important. How is this prevented?  Getting an annual flu shot is the best way to avoid getting the flu. You may get the flu shot  in late summer, fall, or winter. Ask your health care provider when you should get your flu shot.  Wash your hands often or use hand sanitizer often.  Avoid contact with people who are sick during cold and flu season.  Eat a healthy diet, drink plenty of fluids, get enough sleep, and exercise regularly. Contact a health care provider if:  You develop new symptoms.  You have:  Chest pain.  Diarrhea.  A fever.  Your cough gets worse.  You produce more mucus.  You feel nauseous or you vomit. Get help right away if:  You develop shortness of breath or difficulty breathing.  Your skin or nails turn a bluish color.  You have severe pain or stiffness in your neck.  You develop a sudden headache or sudden pain in your face or ear.  You cannot stop vomiting. This information is not intended to replace advice given to you by your health care provider. Make sure you discuss any questions you have with your health care provider. Document Released: 12/23/1999 Document Revised: 06/02/2015 Document Reviewed: 10/19/2014 Elsevier Interactive Patient Education  2017 [employer]  Sinusitis, Adult Sinusitis is soreness and inflammation of your sinuses. Sinuses are hollow spaces in the bones around your face. Your sinuses are located:  Around your eyes.  In the middle of your forehead.  Behind your nose.  In your cheekbones. Your sinuses and nasal passages are lined with a stringy fluid (mucus). Mucus normally drains out of your sinuses. When your nasal tissues become inflamed or swollen, the mucus can become trapped or blocked so air cannot flow through your sinuses. This allows bacteria, viruses, and funguses to grow, which leads to infection. Sinusitis can develop quickly and last for 7?10 days (acute) or for more than 12 weeks (chronic). Sinusitis often develops after a cold. What are the causes? This condition is caused by anything that creates swelling in the sinuses or stops  mucus from draining, including:  Allergies.  Asthma.  Bacterial or viral infection.  Abnormally shaped bones between the nasal passages.  Nasal growths that contain mucus (nasal polyps).  Narrow sinus openings.  Pollutants, such as chemicals or irritants in the air.  A foreign object stuck in the nose.  A fungal infection. This is rare. What increases the risk? The following factors may make you more likely to develop this condition:  Having allergies or asthma.  Having had a recent cold or respiratory tract infection.  Having structural deformities or blockages in your nose or sinuses.  Having a weak immune system.  Doing a lot of swimming or diving.  Overusing nasal sprays.  Smoking. What are the signs or symptoms? The main symptoms of this condition are pain and a feeling of pressure around the affected sinuses. Other symptoms include:  Upper toothache.  Earache.  Headache.  Bad breath.  Decreased sense of smell and taste.  A cough that may get worse at night.  Fatigue.  Fever.  Thick drainage from your nose. The drainage is often green and it may  contain pus (purulent).  Stuffy nose or congestion.  Postnasal drip. This is when extra mucus collects in the throat or back of the nose.  Swelling and warmth over the affected sinuses.  Sore throat.  Sensitivity to light. How is this diagnosed? This condition is diagnosed based on symptoms, a medical history, and a physical exam. To find out if your condition is acute or chronic, your health care provider may:  Look in your nose for signs of nasal polyps.  Tap over the affected sinus to check for signs of infection.  View the inside of your sinuses using an imaging device that has a light attached (endoscope). If your health care provider suspects that you have chronic sinusitis, you may also:  Be tested for allergies.  Have a sample of mucus taken from your nose (nasal culture) and checked  for bacteria.  Have a mucus sample examined to see if your sinusitis is related to an allergy. If your sinusitis does not respond to treatment and it lasts longer than 8 weeks, you may have an MRI or CT scan to check your sinuses. These scans also help to determine how severe your infection is. In rare cases, a bone biopsy may be done to rule out more serious types of fungal sinus disease. How is this treated? Treatment for sinusitis depends on the cause and whether your condition is chronic or acute. If a virus is causing your sinusitis, your symptoms will go away on their own within 10 days. You may be given medicines to relieve your symptoms, including:  Topical nasal decongestants. They shrink swollen nasal passages and let mucus drain from your sinuses.  Antihistamines. These drugs block inflammation that is triggered by allergies. This can help to ease swelling in your nose and sinuses.  Topical nasal corticosteroids. These are nasal sprays that ease inflammation and swelling in your nose and sinuses.  Nasal saline washes. These rinses can help to get rid of thick mucus in your nose.  Ibuprofen 800mg  three times daily for pain/fever/general discomfort.  Use Flonase 2 sprays in each nostril daily for 10 days. If your condition is caused by bacteria, you will be given an antibiotic medicine. If your condition is caused by a fungus, you will be given an antifungal medicine. Surgery may be needed to correct underlying conditions, such as narrow nasal passages. Surgery may also be needed to remove polyps. Follow these instructions at home: Medicines  Take, use, or apply over-the-counter and prescription medicines only as told by your health care provider. These may include nasal sprays.  If you were prescribed an antibiotic medicine, take it as told by your health care provider. Do not stop taking the antibiotic even if you start to feel better. Hydrate and Humidify  Drink enough water  to keep your urine clear or pale yellow. Staying hydrated will help to thin your mucus.  Use a cool mist humidifier to keep the humidity level in your home above 50%.  Inhale steam for 10-15 minutes, 3-4 times a day or as told by your health care provider. You can do this in the bathroom while a hot shower is running.  Limit your exposure to cool or dry air. Rest  Rest as much as possible.  Sleep with your head raised (elevated).  Make sure to get enough sleep each night. General instructions  Apply a warm, moist washcloth to your face 3-4 times a day or as told by your health care provider. This will help with  discomfort.  Wash your hands often with soap and water to reduce your exposure to viruses and other germs. If soap and water are not available, use hand sanitizer.  Do not smoke. Avoid being around people who are smoking (secondhand smoke).  Keep all follow-up visits as told by your health care provider. This is important. Contact a health care provider if:  You have a fever.  Your symptoms get worse.  Your symptoms do not improve within 10 days. Get help right away if:  You have a severe headache.  You have persistent vomiting.  You have pain or swelling around your face or eyes.  You have vision problems.  You develop confusion.  Your neck is stiff.  You have trouble breathing. This information is not intended to replace advice given to you by your health care provider. Make sure you discuss any questions you have with your health care provider. Document Released: 12/25/2004 Document Revised: 08/21/2015 Document Reviewed: 10/20/2014 Elsevier Interactive Patient Education  2017 [employer].

## 2016-02-01 ENCOUNTER — Telehealth: Payer: Self-pay | Admitting: *Deleted

## 2016-02-01 MED ORDER — AMOXICILLIN-POT CLAVULANATE 875-125 MG PO TABS: 1.0000 | ORAL_TABLET | Freq: Two times a day (BID) | ORAL | 0 refills | Status: DC

## 2016-02-01 NOTE — Telephone Encounter (Signed)
Patient calling complaining of swelling of her lip.  Patient took her medication, went to bed, and woke up with a swollen lip.  She did not have any shortness of breath, rash, or nausea.  Patient to a benadryl which did not seem to help or make the swelling worse.    Per Reynolds Bowl, NP,  Patient should stop her Cefdinir.  Patient will start a new prescription for Augmentin 875/125 1 tab twice daily for 14 days.  Patient can also take 1 tab of benadryl every 6 hours as needed.  Patient should go to the ED if symptoms become worse.  Patient should give the office a call with a report within the next 1-2 days.  Patient is aware.  Rx sent.  Cefdinir taken off the medication list and noted as an allergic reaction in the chart.

## 2016-02-03 ENCOUNTER — Telehealth: Payer: Self-pay | Admitting: Nurse Practitioner

## 2016-02-03 NOTE — Telephone Encounter (Signed)
Called patient to check her status. Unable to leave message.

## 2016-03-30 DIAGNOSIS — Z01419 Encounter for gynecological examination (general) (routine) without abnormal findings: Secondary | ICD-10-CM | POA: Diagnosis not present

## 2016-03-30 DIAGNOSIS — Z131 Encounter for screening for diabetes mellitus: Secondary | ICD-10-CM | POA: Diagnosis not present

## 2016-03-30 DIAGNOSIS — Z13220 Encounter for screening for lipoid disorders: Secondary | ICD-10-CM | POA: Diagnosis not present

## 2016-03-30 DIAGNOSIS — Z0000 Encounter for general adult medical examination without abnormal findings: Secondary | ICD-10-CM | POA: Diagnosis not present

## 2016-05-23 ENCOUNTER — Ambulatory Visit (INDEPENDENT_AMBULATORY_CARE_PROVIDER_SITE_OTHER): Payer: Self-pay | Admitting: Nurse Practitioner

## 2016-05-23 ENCOUNTER — Encounter: Payer: Self-pay | Admitting: Nurse Practitioner

## 2016-05-23 VITALS — BP 114/84 | HR 92 | Temp 98.8°F | Wt 157.2 lb

## 2016-05-23 DIAGNOSIS — J0100 Acute maxillary sinusitis, unspecified: Secondary | ICD-10-CM

## 2016-05-23 MED ORDER — AMOXICILLIN-POT CLAVULANATE 875-125 MG PO TABS: 1.0000 | ORAL_TABLET | Freq: Two times a day (BID) | ORAL | 0 refills | Status: AC

## 2016-05-23 MED ORDER — BENZONATATE 100 MG PO CAPS: 100.0000 mg | ORAL_CAPSULE | Freq: Two times a day (BID) | ORAL | 0 refills | Status: AC | PRN

## 2016-05-23 MED ORDER — METHYLPREDNISOLONE ACETATE 80 MG/ML IJ SUSP
80.0000 mg | INTRAMUSCULAR | Status: AC
  Administered 2016-05-23: 80 mg via INTRAMUSCULAR

## 2016-05-23 NOTE — Progress Notes (Signed)
Subjective:  Emma Franco is a 36 y.o. female who presents for evaluation of possible sinusitis.  Symptoms include congestion, post nasal drip, sinus pressure, sinus pain and and headache.  Onset of symptoms was 7 days ago, and has been gradually worsening since that time.  Treatment to date:  Flonase, Zyrtec and an allergy pill..  High risk factors for influenza complications:  none.  The following portions of the patient's history were reviewed and updated as appropriate:  allergies, current medications and past medical history.  Constitutional: positive for chills, fatigue, fevers and night sweats, negative for anorexia Eyes: positive for irritation, redness and and itching Ears, nose, mouth, throat, and face: positive for nasal congestion, sore throat and and tooth pain , negative for ear drainage and earaches Respiratory: positive for cough, sputum and green-dark green sputum production Cardiovascular: negative Neurological: positive for headaches Allergic/Immunologic: positive for hay fever Objective:  BP 114/84 (BP Location: Left Arm, Patient Position: Sitting, Cuff Size: Normal)   Pulse 92   Temp 98.8 F (37.1 C) (Oral)   Wt 157 lb 3.2 oz (71.3 kg)   BMI 25.37 kg/m  General appearance: alert, cooperative and mild distress Head: Normocephalic, without obvious abnormality, atraumatic Eyes: conjunctivae/corneas clear. PERRL, EOM's intact. Fundi benign. Ears: normal TM's and external ear canals both ears Nose: moderate congestion, turbinates red, swollen, moderate maxillary sinus tenderness bilateral, mild frontal sinus tenderness bilateral Throat: lips, mucosa, and tongue normal; teeth and gums normal Lungs: clear to auscultation bilaterally Heart: regular rate and rhythm, S1, S2 normal, no murmur, click, rub or gallop    Assessment:  sinusitis    Plan:  Discussed diagnosis and treatment of sinusitis. Suggested symptomatic OTC remedies. Nasal steroids per orders. Call  in 3-5  days if symptoms aren't resolving.

## 2016-05-23 NOTE — Patient Instructions (Addendum)

## 2016-05-25 ENCOUNTER — Telehealth: Payer: Self-pay | Admitting: Emergency Medicine

## 2016-05-25 NOTE — Telephone Encounter (Signed)
Patient returned my follow up phone call. Stated she is doing better. I notified her to continue taking her antibiotics and get plenty of rest. I also notified her that if she has any questions to fell free to give [place] a call, patient thanked me for the follow up call

## 2016-05-25 NOTE — Telephone Encounter (Signed)
Called patient to follow up with her and see how she is feeling. No response left voice mail.

## 2016-07-03 DIAGNOSIS — H5212 Myopia, left eye: Secondary | ICD-10-CM | POA: Diagnosis not present

## 2016-09-13 ENCOUNTER — Ambulatory Visit (INDEPENDENT_AMBULATORY_CARE_PROVIDER_SITE_OTHER): Payer: 59 | Admitting: Otolaryngology

## 2016-09-13 DIAGNOSIS — R51 Headache: Secondary | ICD-10-CM

## 2016-09-13 DIAGNOSIS — J320 Chronic maxillary sinusitis: Secondary | ICD-10-CM | POA: Diagnosis not present

## 2016-09-13 DIAGNOSIS — J310 Chronic rhinitis: Secondary | ICD-10-CM | POA: Diagnosis not present

## 2016-09-26 DIAGNOSIS — M25552 Pain in left hip: Secondary | ICD-10-CM | POA: Diagnosis not present

## 2016-09-26 DIAGNOSIS — M542 Cervicalgia: Secondary | ICD-10-CM | POA: Diagnosis not present

## 2016-09-26 DIAGNOSIS — M545 Low back pain: Secondary | ICD-10-CM | POA: Diagnosis not present

## 2016-10-02 DIAGNOSIS — M542 Cervicalgia: Secondary | ICD-10-CM | POA: Diagnosis not present

## 2016-10-02 DIAGNOSIS — M545 Low back pain: Secondary | ICD-10-CM | POA: Diagnosis not present

## 2016-10-02 DIAGNOSIS — M25552 Pain in left hip: Secondary | ICD-10-CM | POA: Diagnosis not present

## 2016-10-05 DIAGNOSIS — M542 Cervicalgia: Secondary | ICD-10-CM | POA: Diagnosis not present

## 2016-10-05 DIAGNOSIS — M545 Low back pain: Secondary | ICD-10-CM | POA: Diagnosis not present

## 2016-10-05 DIAGNOSIS — M25552 Pain in left hip: Secondary | ICD-10-CM | POA: Diagnosis not present

## 2016-10-08 DIAGNOSIS — M545 Low back pain: Secondary | ICD-10-CM | POA: Diagnosis not present

## 2016-10-08 DIAGNOSIS — M25552 Pain in left hip: Secondary | ICD-10-CM | POA: Diagnosis not present

## 2016-10-08 DIAGNOSIS — M542 Cervicalgia: Secondary | ICD-10-CM | POA: Diagnosis not present

## 2016-10-10 DIAGNOSIS — M542 Cervicalgia: Secondary | ICD-10-CM | POA: Diagnosis not present

## 2016-10-10 DIAGNOSIS — M25552 Pain in left hip: Secondary | ICD-10-CM | POA: Diagnosis not present

## 2016-10-10 DIAGNOSIS — M545 Low back pain: Secondary | ICD-10-CM | POA: Diagnosis not present

## 2016-10-15 DIAGNOSIS — M545 Low back pain: Secondary | ICD-10-CM | POA: Diagnosis not present

## 2016-10-15 DIAGNOSIS — M25552 Pain in left hip: Secondary | ICD-10-CM | POA: Diagnosis not present

## 2016-10-15 DIAGNOSIS — M542 Cervicalgia: Secondary | ICD-10-CM | POA: Diagnosis not present

## 2016-10-17 DIAGNOSIS — M542 Cervicalgia: Secondary | ICD-10-CM | POA: Diagnosis not present

## 2016-10-17 DIAGNOSIS — M25552 Pain in left hip: Secondary | ICD-10-CM | POA: Diagnosis not present

## 2016-10-17 DIAGNOSIS — M545 Low back pain: Secondary | ICD-10-CM | POA: Diagnosis not present

## 2016-10-22 DIAGNOSIS — M545 Low back pain: Secondary | ICD-10-CM | POA: Diagnosis not present

## 2016-10-22 DIAGNOSIS — M542 Cervicalgia: Secondary | ICD-10-CM | POA: Diagnosis not present

## 2016-10-22 DIAGNOSIS — M25552 Pain in left hip: Secondary | ICD-10-CM | POA: Diagnosis not present

## 2016-10-24 DIAGNOSIS — M545 Low back pain: Secondary | ICD-10-CM | POA: Diagnosis not present

## 2016-10-24 DIAGNOSIS — M542 Cervicalgia: Secondary | ICD-10-CM | POA: Diagnosis not present

## 2016-10-24 DIAGNOSIS — M25552 Pain in left hip: Secondary | ICD-10-CM | POA: Diagnosis not present

## 2016-10-29 DIAGNOSIS — M25552 Pain in left hip: Secondary | ICD-10-CM | POA: Diagnosis not present

## 2016-10-29 DIAGNOSIS — M545 Low back pain: Secondary | ICD-10-CM | POA: Diagnosis not present

## 2016-10-29 DIAGNOSIS — M542 Cervicalgia: Secondary | ICD-10-CM | POA: Diagnosis not present

## 2016-10-31 DIAGNOSIS — M545 Low back pain: Secondary | ICD-10-CM | POA: Diagnosis not present

## 2016-10-31 DIAGNOSIS — M542 Cervicalgia: Secondary | ICD-10-CM | POA: Diagnosis not present

## 2016-10-31 DIAGNOSIS — M25552 Pain in left hip: Secondary | ICD-10-CM | POA: Diagnosis not present

## 2016-11-05 DIAGNOSIS — M542 Cervicalgia: Secondary | ICD-10-CM | POA: Diagnosis not present

## 2016-11-05 DIAGNOSIS — M25552 Pain in left hip: Secondary | ICD-10-CM | POA: Diagnosis not present

## 2016-11-05 DIAGNOSIS — M545 Low back pain: Secondary | ICD-10-CM | POA: Diagnosis not present

## 2016-11-09 DIAGNOSIS — M545 Low back pain: Secondary | ICD-10-CM | POA: Diagnosis not present

## 2016-11-09 DIAGNOSIS — M542 Cervicalgia: Secondary | ICD-10-CM | POA: Diagnosis not present

## 2016-11-09 DIAGNOSIS — M25552 Pain in left hip: Secondary | ICD-10-CM | POA: Diagnosis not present

## 2016-11-21 DIAGNOSIS — M545 Low back pain: Secondary | ICD-10-CM | POA: Diagnosis not present

## 2016-11-21 DIAGNOSIS — G8929 Other chronic pain: Secondary | ICD-10-CM | POA: Diagnosis not present

## 2016-11-21 DIAGNOSIS — M542 Cervicalgia: Secondary | ICD-10-CM | POA: Diagnosis not present

## 2016-12-11 DIAGNOSIS — M50223 Other cervical disc displacement at C6-C7 level: Secondary | ICD-10-CM | POA: Diagnosis not present

## 2016-12-11 DIAGNOSIS — M545 Low back pain: Secondary | ICD-10-CM | POA: Diagnosis not present

## 2016-12-11 DIAGNOSIS — M542 Cervicalgia: Secondary | ICD-10-CM | POA: Diagnosis not present

## 2016-12-19 DIAGNOSIS — M4150 Other secondary scoliosis, site unspecified: Secondary | ICD-10-CM | POA: Diagnosis not present

## 2016-12-19 DIAGNOSIS — R030 Elevated blood-pressure reading, without diagnosis of hypertension: Secondary | ICD-10-CM | POA: Diagnosis not present

## 2016-12-26 DIAGNOSIS — M542 Cervicalgia: Secondary | ICD-10-CM | POA: Diagnosis not present

## 2016-12-26 DIAGNOSIS — M546 Pain in thoracic spine: Secondary | ICD-10-CM | POA: Diagnosis not present

## 2016-12-26 DIAGNOSIS — M545 Low back pain: Secondary | ICD-10-CM | POA: Diagnosis not present

## 2016-12-28 ENCOUNTER — Encounter: Payer: Self-pay | Admitting: Rheumatology

## 2016-12-28 DIAGNOSIS — J329 Chronic sinusitis, unspecified: Secondary | ICD-10-CM | POA: Diagnosis not present

## 2016-12-28 DIAGNOSIS — J309 Allergic rhinitis, unspecified: Secondary | ICD-10-CM | POA: Diagnosis not present

## 2016-12-28 DIAGNOSIS — M419 Scoliosis, unspecified: Secondary | ICD-10-CM | POA: Diagnosis not present

## 2017-01-03 DIAGNOSIS — M546 Pain in thoracic spine: Secondary | ICD-10-CM | POA: Diagnosis not present

## 2017-01-03 DIAGNOSIS — M542 Cervicalgia: Secondary | ICD-10-CM | POA: Diagnosis not present

## 2017-01-03 DIAGNOSIS — M545 Low back pain: Secondary | ICD-10-CM | POA: Diagnosis not present

## 2017-01-04 DIAGNOSIS — M542 Cervicalgia: Secondary | ICD-10-CM | POA: Diagnosis not present

## 2017-01-04 DIAGNOSIS — M545 Low back pain: Secondary | ICD-10-CM | POA: Diagnosis not present

## 2017-01-04 DIAGNOSIS — M546 Pain in thoracic spine: Secondary | ICD-10-CM | POA: Diagnosis not present

## 2017-01-09 DIAGNOSIS — M545 Low back pain: Secondary | ICD-10-CM | POA: Diagnosis not present

## 2017-01-09 DIAGNOSIS — M546 Pain in thoracic spine: Secondary | ICD-10-CM | POA: Diagnosis not present

## 2017-01-09 DIAGNOSIS — M542 Cervicalgia: Secondary | ICD-10-CM | POA: Diagnosis not present

## 2017-01-15 DIAGNOSIS — M545 Low back pain: Secondary | ICD-10-CM | POA: Diagnosis not present

## 2017-01-15 DIAGNOSIS — M542 Cervicalgia: Secondary | ICD-10-CM | POA: Diagnosis not present

## 2017-01-15 DIAGNOSIS — M546 Pain in thoracic spine: Secondary | ICD-10-CM | POA: Diagnosis not present

## 2017-01-17 DIAGNOSIS — M542 Cervicalgia: Secondary | ICD-10-CM | POA: Diagnosis not present

## 2017-01-17 DIAGNOSIS — M545 Low back pain: Secondary | ICD-10-CM | POA: Diagnosis not present

## 2017-01-17 DIAGNOSIS — M546 Pain in thoracic spine: Secondary | ICD-10-CM | POA: Diagnosis not present

## 2017-01-22 DIAGNOSIS — M546 Pain in thoracic spine: Secondary | ICD-10-CM | POA: Diagnosis not present

## 2017-01-22 DIAGNOSIS — M545 Low back pain: Secondary | ICD-10-CM | POA: Diagnosis not present

## 2017-01-22 DIAGNOSIS — M542 Cervicalgia: Secondary | ICD-10-CM | POA: Diagnosis not present

## 2017-01-24 DIAGNOSIS — M546 Pain in thoracic spine: Secondary | ICD-10-CM | POA: Diagnosis not present

## 2017-01-24 DIAGNOSIS — M542 Cervicalgia: Secondary | ICD-10-CM | POA: Diagnosis not present

## 2017-01-24 DIAGNOSIS — M545 Low back pain: Secondary | ICD-10-CM | POA: Diagnosis not present

## 2017-01-29 DIAGNOSIS — M542 Cervicalgia: Secondary | ICD-10-CM | POA: Diagnosis not present

## 2017-01-29 DIAGNOSIS — M545 Low back pain: Secondary | ICD-10-CM | POA: Diagnosis not present

## 2017-01-29 DIAGNOSIS — M546 Pain in thoracic spine: Secondary | ICD-10-CM | POA: Diagnosis not present

## 2017-01-31 DIAGNOSIS — M542 Cervicalgia: Secondary | ICD-10-CM | POA: Diagnosis not present

## 2017-01-31 DIAGNOSIS — M546 Pain in thoracic spine: Secondary | ICD-10-CM | POA: Diagnosis not present

## 2017-01-31 DIAGNOSIS — M545 Low back pain: Secondary | ICD-10-CM | POA: Diagnosis not present

## 2017-02-05 DIAGNOSIS — M542 Cervicalgia: Secondary | ICD-10-CM | POA: Diagnosis not present

## 2017-02-05 DIAGNOSIS — M546 Pain in thoracic spine: Secondary | ICD-10-CM | POA: Diagnosis not present

## 2017-02-05 DIAGNOSIS — M545 Low back pain: Secondary | ICD-10-CM | POA: Diagnosis not present

## 2017-02-07 DIAGNOSIS — M545 Low back pain: Secondary | ICD-10-CM | POA: Diagnosis not present

## 2017-02-07 DIAGNOSIS — M546 Pain in thoracic spine: Secondary | ICD-10-CM | POA: Diagnosis not present

## 2017-02-07 DIAGNOSIS — M542 Cervicalgia: Secondary | ICD-10-CM | POA: Diagnosis not present

## 2017-02-12 DIAGNOSIS — M542 Cervicalgia: Secondary | ICD-10-CM | POA: Diagnosis not present

## 2017-02-12 DIAGNOSIS — M545 Low back pain: Secondary | ICD-10-CM | POA: Diagnosis not present

## 2017-02-12 DIAGNOSIS — M546 Pain in thoracic spine: Secondary | ICD-10-CM | POA: Diagnosis not present

## 2017-02-19 DIAGNOSIS — M546 Pain in thoracic spine: Secondary | ICD-10-CM | POA: Diagnosis not present

## 2017-02-19 DIAGNOSIS — M545 Low back pain: Secondary | ICD-10-CM | POA: Diagnosis not present

## 2017-02-19 DIAGNOSIS — M542 Cervicalgia: Secondary | ICD-10-CM | POA: Diagnosis not present

## 2017-02-21 DIAGNOSIS — M545 Low back pain: Secondary | ICD-10-CM | POA: Diagnosis not present

## 2017-02-21 DIAGNOSIS — M546 Pain in thoracic spine: Secondary | ICD-10-CM | POA: Diagnosis not present

## 2017-02-21 DIAGNOSIS — M542 Cervicalgia: Secondary | ICD-10-CM | POA: Diagnosis not present

## 2017-02-26 DIAGNOSIS — M546 Pain in thoracic spine: Secondary | ICD-10-CM | POA: Diagnosis not present

## 2017-02-26 DIAGNOSIS — M542 Cervicalgia: Secondary | ICD-10-CM | POA: Diagnosis not present

## 2017-02-26 DIAGNOSIS — M545 Low back pain: Secondary | ICD-10-CM | POA: Diagnosis not present

## 2017-02-28 DIAGNOSIS — M542 Cervicalgia: Secondary | ICD-10-CM | POA: Diagnosis not present

## 2017-02-28 DIAGNOSIS — M546 Pain in thoracic spine: Secondary | ICD-10-CM | POA: Diagnosis not present

## 2017-02-28 DIAGNOSIS — M545 Low back pain: Secondary | ICD-10-CM | POA: Diagnosis not present

## 2017-03-05 DIAGNOSIS — M545 Low back pain: Secondary | ICD-10-CM | POA: Diagnosis not present

## 2017-03-05 DIAGNOSIS — M542 Cervicalgia: Secondary | ICD-10-CM | POA: Diagnosis not present

## 2017-03-05 DIAGNOSIS — M546 Pain in thoracic spine: Secondary | ICD-10-CM | POA: Diagnosis not present

## 2017-03-12 DIAGNOSIS — M542 Cervicalgia: Secondary | ICD-10-CM | POA: Diagnosis not present

## 2017-03-12 DIAGNOSIS — M546 Pain in thoracic spine: Secondary | ICD-10-CM | POA: Diagnosis not present

## 2017-03-12 DIAGNOSIS — M545 Low back pain: Secondary | ICD-10-CM | POA: Diagnosis not present

## 2017-03-15 DIAGNOSIS — M545 Low back pain: Secondary | ICD-10-CM | POA: Diagnosis not present

## 2017-03-15 DIAGNOSIS — M542 Cervicalgia: Secondary | ICD-10-CM | POA: Diagnosis not present

## 2017-03-15 DIAGNOSIS — M546 Pain in thoracic spine: Secondary | ICD-10-CM | POA: Diagnosis not present

## 2017-03-26 DIAGNOSIS — M546 Pain in thoracic spine: Secondary | ICD-10-CM | POA: Diagnosis not present

## 2017-03-26 DIAGNOSIS — M545 Low back pain: Secondary | ICD-10-CM | POA: Diagnosis not present

## 2017-03-26 DIAGNOSIS — M542 Cervicalgia: Secondary | ICD-10-CM | POA: Diagnosis not present

## 2017-04-02 DIAGNOSIS — M545 Low back pain: Secondary | ICD-10-CM | POA: Diagnosis not present

## 2017-04-02 DIAGNOSIS — M546 Pain in thoracic spine: Secondary | ICD-10-CM | POA: Diagnosis not present

## 2017-04-02 DIAGNOSIS — M542 Cervicalgia: Secondary | ICD-10-CM | POA: Diagnosis not present

## 2017-04-04 DIAGNOSIS — M545 Low back pain: Secondary | ICD-10-CM | POA: Diagnosis not present

## 2017-04-04 DIAGNOSIS — M542 Cervicalgia: Secondary | ICD-10-CM | POA: Diagnosis not present

## 2017-04-04 DIAGNOSIS — M546 Pain in thoracic spine: Secondary | ICD-10-CM | POA: Diagnosis not present

## 2017-04-08 DIAGNOSIS — N920 Excessive and frequent menstruation with regular cycle: Secondary | ICD-10-CM | POA: Diagnosis not present

## 2017-04-08 DIAGNOSIS — Z01419 Encounter for gynecological examination (general) (routine) without abnormal findings: Secondary | ICD-10-CM | POA: Diagnosis not present

## 2017-04-08 DIAGNOSIS — J329 Chronic sinusitis, unspecified: Secondary | ICD-10-CM | POA: Diagnosis not present

## 2017-05-08 NOTE — Progress Notes (Signed)
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   History of Present Illness: Emma Franco is a 37 y.o. female seen in consultation per request of her PCP.  According to patient was diagnosed with scoliosis when she was in elementary school.  She states she went to [facility] several times for evaluation and she was told that she had 12 degrees curvature in her spine.  She had no follow-up after a while in the lower back pain persist.  She states since she was 37 years old she has been having left hip joint pain off and on.  In June 2018 she started experiencing them neck pain as well.  She states she was seen by chiropractor who did evaluation of her spine and told her that she had 35 degree curvature in her spine.  After that she was evaluated by neurosurgeon who confirmed the curvature at 35 degree and also did MRI of her lumbar spine.  He did not advise surgery and advised physical therapy.  She has been going to physical therapy.  She states the pain in her neck lower back and her left hip persists.  There is no history of joint swelling.  She does experience some paresthesias in her hands and her feet intermittently.  There is positive history of scoliosis in her father.  And positive family history of rheumatoid arthritis in her mother.  She was recently seen by her PCP and had labs which were positive for ANA for that reason she was referred to me.  Activities of Daily Living:  Patient reports morning stiffness for 1 hour.   Patient Reports nocturnal pain.  Difficulty dressing/grooming: Denies Difficulty climbing stairs: Denies Difficulty getting out of chair: Denies Difficulty using hands for taps, buttons, cutlery, and/or  writing: Denies   Review of Systems  Constitutional: Positive for fatigue. Negative for fever, night sweats, weight gain and weight loss.  HENT: Negative for ear pain, mouth sores, trouble swallowing, trouble swallowing, mouth dryness and nose dryness.   Eyes: Positive for dryness. Negative for pain, redness and visual disturbance.  Respiratory: Negative for cough, shortness of breath and difficulty breathing.   Cardiovascular: Negative for chest pain, palpitations, hypertension, irregular heartbeat and swelling in legs/feet.  Gastrointestinal: Positive for constipation. Negative for blood in stool and diarrhea.  Endocrine: Negative for increased urination.  Genitourinary: Negative for difficulty urinating and vaginal dryness.  Musculoskeletal: Positive for arthralgias, joint pain, myalgias, morning stiffness and myalgias. Negative for joint swelling, muscle weakness and muscle tenderness.  Skin: Positive for hair loss. Negative for color change, rash, skin tightness, ulcers and sensitivity to sunlight.  Allergic/Immunologic: Negative for susceptible to infections.  Neurological: Positive for numbness. Negative for dizziness, memory loss, night sweats and weakness.  Hematological: Negative for bruising/bleeding tendency and swollen glands.  Psychiatric/Behavioral: Positive for sleep disturbance. Negative for depressed mood. The patient is not nervous/anxious.     PMFS History:  Patient Active Problem List   Diagnosis Date Noted  . Chronic sinusitis 05/21/2017  . History of anxiety 05/21/2017  . Scoliosis 05/21/2017  . Family history of rheumatoid  arthritis 05/21/2017  . Previous cesarean delivery, delivered 10/19/2013  . Acute blood loss anemia 10/17/2013  . Postpartum care following cesarean delivery (10/9) 10/16/2013  . Gastroenteritis 09/11/2013  . ESOPHAGITIS 03/18/2008  . HIATAL HERNIA 03/18/2008  . GERD 02/16/2008  . IRRITABLE BOWEL SYNDROME 02/06/2008  . ARTHRITIS 02/06/2008   . ABDOMINAL PAIN-EPIGASTRIC 02/06/2008    Past Medical History:  Diagnosis Date  . 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 Environmental allergies   . Esophageal reflux   . Headache(784.0) 07/18/11   "qd for the last year; CSF leak; repaired today"  . Hiatal hernia   . Irritable bowel syndrome   . Migraines    "once in a blue moon"  . PONV (postoperative nausea and vomiting)   . Postpartum care following cesarean delivery (10/9) 10/16/2013  . Scoliosis    mild  . Sinusitis, chronic     Family History  Problem Relation Age of Onset  . Stroke Maternal Grandmother   . Stroke Paternal Grandmother   . Rheum arthritis Mother   . Multiple sclerosis Mother    Past Surgical History:  Procedure Laterality Date  . CESAREAN SECTION  03/2009  . CESAREAN SECTION Bilateral 10/16/2013   Procedure: REPEAT CESAREAN SECTION WITH BILATERAL TUBAL LIGATION ;  Surgeon: Claiborne Billings A. Pamala Hurry, MD;  Location: [facility];  Service: Obstetrics;  Laterality: Bilateral;   EDD: 10/26/13  . CHOLECYSTECTOMY  12/1996  . NASAL SEPTUM SURGERY  2006  . NASAL SINUS SURGERY  03/2010; 07/18/11  . REPAIR DURAL / CSF LEAK  07/18/11  . SINUS ENDO W/FUSION  07/18/2011   Procedure: ENDOSCOPIC SINUS SURGERY WITH FUSION NAVIGATION;  Surgeon: Ruby Cola, MD;  Location: [facility];  Service: ENT;  Laterality: N/A;  . TONSILLECTOMY  1990   Social History   Social History Narrative  . Not on file     Objective: Vital Signs: BP 136/89 (BP Location: Left Arm, Patient Position: Sitting, Cuff Size: Normal)   Pulse (!) 109   Resp 16   Ht 5' 5" (1.651 m)   Wt 160 lb (72.6 kg)   BMI 26.63 kg/m    Physical Exam  Constitutional: She is oriented to person, place, and time. She appears well-developed and well-nourished.  HENT:  Head: Normocephalic and atraumatic.  Eyes: Conjunctivae and EOM are normal.  Neck: Normal range of motion.  Cardiovascular: Normal rate, regular rhythm, normal heart sounds and intact distal pulses.  Pulmonary/Chest:  Effort normal and breath sounds normal.  Abdominal: Soft. Bowel sounds are normal.  Lymphadenopathy:    She has no cervical adenopathy.  Neurological: She is alert and oriented to person, place, and time.  Skin: Skin is warm and dry. Capillary refill takes less than 2 seconds.  Psychiatric: She has a normal mood and affect. Her behavior is normal.  Nursing note and vitals reviewed.    Musculoskeletal Exam: C-spine some stiffness with range of motion.  She has thoracic scoliosis and lumbar scoliosis.  Shoulder joints elbow joints wrist joint MCPs PIPs DIPs were all good range of motion with no synovitis.  She appears to have leg length discrepancy.  Her left hips appears to be higher than her right hip.  She has some discomfort range of motion of her left hip joint.  Knee joints ankles MTPs PIPs well good range of motion without any synovitis.  CDAI Exam: No CDAI exam completed.    Investigation: No additional findings. CBC Latest Ref Rng & Units 10/17/2013 10/15/2013 09/11/2013  WBC  4.0 - 10.5 K/uL 15.4(H) 11.7(H) 11.2(H)  Hemoglobin 12.0 - 15.0 g/dL 10.4(L) 12.3 12.3  Hematocrit 36.0 - 46.0 % 30.2(L) 34.9(L) 34.7(L)  Platelets 150 - 400 K/uL 153 165 195   CMP Latest Ref Rng & Units 09/11/2013 12/27/2009 03/15/2009  Glucose 70 - 99 mg/dL 82 88 118(H)  BUN 6 - 23 mg/dL 4(L) 7 4(L)  Creatinine 0.50 - 1.10 mg/dL 0.43(L) 0.7 0.45  Sodium 137 - 147 mEq/L 136(L) 138 130(L)  Potassium 3.7 - 5.3 mEq/L 3.0(L) 3.9 3.2(L)  Chloride 96 - 112 mEq/L 98 105 101  CO2 19 - 32 mEq/L _0 Calcium 8.4 - 10.5 mg/dL 8.6 9.1 7.5(L)  Total Protein 6.0 - 8.3 g/dL 6.4 6.6 -  Total Bilirubin 0.3 - 1.2 mg/dL 0.9 0.5 -  Alkaline Phos 39 - 117 U/L 131(H) 61 -  AST 0 - 37 U/L 31 20 -  ALT 0 - 35 U/L 23 20 -  December 28, 2016 RF negative, ESR 2, C-reactive protein less than 0.3, ANA positive  Imaging: Xr Hip Unilat W Or W/o Pelvis 2-3 Views Left  Result Date: 05/21/2017 No hip joint narrowing or SI joint  narrowing was noted. Unremarkable x-ray of the hip joint.   Speciality Comments: No specialty comments available.    Procedures:  Trigger Point Inj Date/Time: 05/21/2017 9:57 AM Performed by: Bo Merino, MD Authorized by: Bo Merino, MD   Consent Given by:  Patient Site marked: the procedure site was marked   Timeout: prior to procedure the correct patient, procedure, and site was verified   Indications:  Muscle spasm and pain Total # of Trigger Points:  1 Location: neck   Needle Size:  27 G Approach:  Dorsal Medications #1:  0.5 mL lidocaine 1 %; 10 mg triamcinolone acetonide 40 MG/ML Patient tolerance:  Patient tolerated the procedure well with no immediate complications   Allergies: Cefdinir and Ciprofloxacin   Assessment / Plan:     Visit Diagnoses: Positive ANA (antinuclear antibody) - RF-, Sed rate 2, CRP <0.3 -patient gives history of left hip joint pain, fatigue and hair loss.  She had ANA which was positive but no titer was given.  We had detailed discussion that she does not have much features of autoimmune disease.  To complete the work-up I will obtain following labs.  Plan: ANA, RNP Antibody, Anti-scleroderma antibody, Anti-Smith antibody, Sjogrens syndrome-A extractable nuclear antibody, Sjogrens syndrome-B extractable nuclear antibody, Anti-DNA antibody, double-stranded, C3 and C4  Neck pain - Mobic 7.5 mg BID and Baclofen 10 mg TID PRN she continues to have some neck stiffness.  She had right trapezius spasm.  After different treatment options were discussed right trapezius was injected with lidocaine cortisone which she tolerated well.  Pain in left hip - Plan: XR HIP UNILAT W OR W/O PELVIS 2-3 VIEWS LEFT, Cyclic citrul peptide antibody, IgG, 14-3-3 eta Protein  Other fatigue - Plan: CBC with Differential/Platelet, COMPLETE METABOLIC PANEL WITH GFR  Scoliosis, unspecified scoliosis type, unspecified spinal region-she has significant scoliosis  involving the thoracic and lumbar spine.  She also appears to have leg length discrepancy.  She states she has a shoe wedge but she has not been wearing.  I have encouraged the use of the shoe wedge.  Also back and strengthening exercises were explained at length.  Encouraged swimming and stretching exercises.  A handout on back exercises and neck exercise was given.  Gastroesophageal reflux disease with esophagitis  History of IBS  Hiatal hernia  History of anxiety-she has been quite tearful during the conversation.  She states she is a lot of discomfort despite taking medications.  She may benefit from Cymbalta.  I have advised her to discuss with her PCP regarding switching from Wellbutrin to Cymbalta.  Chronic sinusitis, unspecified location - s/p surgery, also complicated by CSF leak, which was repaired   Family history of rheumatoid arthritis - Mother    Orders: Orders Placed This Encounter  Procedures  . Trigger Point Inj  . XR HIP UNILAT W OR W/O PELVIS 2-3 VIEWS LEFT  . CBC with Differential/Platelet  . COMPLETE METABOLIC PANEL WITH GFR  . Cyclic citrul peptide antibody, IgG  . 14-3-3 eta Protein  . ANA  . RNP Antibody  . Anti-scleroderma antibody  . Anti-Smith antibody  . Sjogrens syndrome-A extractable nuclear antibody  . Sjogrens syndrome-B extractable nuclear antibody  . Anti-DNA antibody, double-stranded  . C3 and C4   No orders of the defined types were placed in this encounter.   Face-to-face time spent with patient was 45 minutes.> 50% of time was spent in counseling and coordination of care.  Follow-Up Instructions: Return for Arthralgia, positive ANA, scoliosis.   Bo Merino, MD  Note - This record has been created using Editor, commissioning.  Chart creation errors have been sought, but may not always  have been located. Such creation errors do not reflect on  the standard of medical care.

## 2017-05-21 ENCOUNTER — Ambulatory Visit: Payer: 59 | Admitting: Rheumatology

## 2017-05-21 ENCOUNTER — Ambulatory Visit (INDEPENDENT_AMBULATORY_CARE_PROVIDER_SITE_OTHER): Payer: Self-pay

## 2017-05-21 ENCOUNTER — Encounter: Payer: Self-pay | Admitting: Rheumatology

## 2017-05-21 VITALS — BP 136/89 | HR 109 | Resp 16 | Ht 65.0 in | Wt 160.0 lb

## 2017-05-21 DIAGNOSIS — Z8261 Family history of arthritis: Secondary | ICD-10-CM

## 2017-05-21 DIAGNOSIS — R7689 Other specified abnormal immunological findings in serum: Secondary | ICD-10-CM

## 2017-05-21 DIAGNOSIS — M419 Scoliosis, unspecified: Secondary | ICD-10-CM | POA: Diagnosis not present

## 2017-05-21 DIAGNOSIS — Z8719 Personal history of other diseases of the digestive system: Secondary | ICD-10-CM | POA: Diagnosis not present

## 2017-05-21 DIAGNOSIS — J329 Chronic sinusitis, unspecified: Secondary | ICD-10-CM | POA: Diagnosis not present

## 2017-05-21 DIAGNOSIS — K449 Diaphragmatic hernia without obstruction or gangrene: Secondary | ICD-10-CM | POA: Diagnosis not present

## 2017-05-21 DIAGNOSIS — K2100 Gastro-esophageal reflux disease with esophagitis, without bleeding: Secondary | ICD-10-CM

## 2017-05-21 DIAGNOSIS — M542 Cervicalgia: Secondary | ICD-10-CM

## 2017-05-21 DIAGNOSIS — Z8659 Personal history of other mental and behavioral disorders: Secondary | ICD-10-CM | POA: Diagnosis not present

## 2017-05-21 DIAGNOSIS — R5383 Other fatigue: Secondary | ICD-10-CM

## 2017-05-21 DIAGNOSIS — R768 Other specified abnormal immunological findings in serum: Secondary | ICD-10-CM | POA: Diagnosis not present

## 2017-05-21 DIAGNOSIS — M25552 Pain in left hip: Secondary | ICD-10-CM | POA: Diagnosis not present

## 2017-05-21 HISTORY — DX: Family history of arthritis: Z82.61

## 2017-05-21 HISTORY — DX: Chronic sinusitis, unspecified: J32.9

## 2017-05-21 MED ORDER — LIDOCAINE HCL 1 % IJ SOLN
0.5000 mL | INTRAMUSCULAR | Status: AC | PRN
  Administered 2017-05-21: .5 mL

## 2017-05-21 MED ORDER — TRIAMCINOLONE ACETONIDE 40 MG/ML IJ SUSP
10.0000 mg | INTRAMUSCULAR | Status: AC | PRN
  Administered 2017-05-21: 10 mg via INTRAMUSCULAR

## 2017-05-22 NOTE — Progress Notes (Signed)
WNL

## 2017-05-25 LAB — COMPLETE METABOLIC PANEL WITH GFR
AG Ratio: 1.8 (calc) (ref 1.0–2.5)
ALT: 8 U/L (ref 6–29)
AST: 13 U/L (ref 10–30)
Albumin: 4.3 g/dL (ref 3.6–5.1)
Alkaline phosphatase (APISO): 49 U/L (ref 33–115)
BUN: 8 mg/dL (ref 7–25)
CALCIUM: 9.2 mg/dL (ref 8.6–10.2)
CO2: 26 mmol/L (ref 20–32)
Chloride: 105 mmol/L (ref 98–110)
Creat: 0.84 mg/dL (ref 0.50–1.10)
GFR, EST NON AFRICAN AMERICAN: 88 mL/min/{1.73_m2} (ref >=60)
GFR, Est African American: 102 mL/min/{1.73_m2} (ref >=60)
GLUCOSE: 103 mg/dL — AB (ref 65–99)
Globulin: 2.4 g/dL (calc) (ref 1.9–3.7)
Potassium: 4.5 mmol/L (ref 3.5–5.3)
Sodium: 136 mmol/L (ref 135–146)
TOTAL PROTEIN: 6.7 g/dL (ref 6.1–8.1)
Total Bilirubin: 0.3 mg/dL (ref 0.2–1.2)

## 2017-05-25 LAB — CBC WITH DIFFERENTIAL/PLATELET
BASOS PCT: 0.8 %
Basophils Absolute: 50 cells/uL (ref 0–200)
Eosinophils Absolute: 446 cells/uL (ref 15–500)
Eosinophils Relative: 7.2 %
HCT: 37.2 % (ref 35.0–45.0)
Hemoglobin: 12.4 g/dL (ref 11.7–15.5)
Lymphs Abs: 1246 cells/uL (ref 850–3900)
MCH: 28.2 pg (ref 27.0–33.0)
MCHC: 33.3 g/dL (ref 32.0–36.0)
MCV: 84.5 fL (ref 80.0–100.0)
MONOS PCT: 7.1 %
MPV: 10.9 fL (ref 7.5–12.5)
Neutro Abs: 4018 cells/uL (ref 1500–7800)
Neutrophils Relative %: 64.8 %
Platelets: 252 10*3/uL (ref 140–400)
RBC: 4.4 10*6/uL (ref 3.80–5.10)
RDW: 13.1 % (ref 11.0–15.0)
TOTAL LYMPHOCYTE: 20.1 %
WBC: 6.2 10*3/uL (ref 3.8–10.8)
WBCMIX: 440 {cells}/uL (ref 200–950)

## 2017-05-25 LAB — ANTI-SMITH ANTIBODY: ENA SM AB SER-ACNC: NEGATIVE AI

## 2017-05-25 LAB — SJOGRENS SYNDROME-A EXTRACTABLE NUCLEAR ANTIBODY: SSA (Ro) (ENA) Antibody, IgG: <1 AI

## 2017-05-25 LAB — C3 AND C4
C3 Complement: 113 mg/dL (ref 83–193)
C4 Complement: 38 mg/dL (ref 15–57)

## 2017-05-25 LAB — CYCLIC CITRUL PEPTIDE ANTIBODY, IGG: Cyclic Citrullin Peptide Ab: <16 UNITS

## 2017-05-25 LAB — ANA: Anti Nuclear Antibody(ANA): NEGATIVE

## 2017-05-25 LAB — ANTI-SCLERODERMA ANTIBODY: Scleroderma (Scl-70) (ENA) Antibody, IgG: <1 AI

## 2017-05-25 LAB — ANTI-DNA ANTIBODY, DOUBLE-STRANDED: DS DNA AB: 48 [IU]/mL — AB

## 2017-05-25 LAB — RNP ANTIBODY: Ribonucleic Protein(ENA) Antibody, IgG: <1 AI

## 2017-05-25 LAB — SJOGRENS SYNDROME-B EXTRACTABLE NUCLEAR ANTIBODY: SSB (La) (ENA) Antibody, IgG: <1 AI

## 2017-05-25 LAB — 14-3-3 ETA PROTEIN: 14-3-3 eta Protein: <0.2 ng/mL (ref <0.2)

## 2017-05-31 ENCOUNTER — Encounter: Payer: Self-pay | Admitting: Rheumatology

## 2017-05-31 NOTE — Telephone Encounter (Signed)
We will discuss labs at the fu visit.

## 2017-06-17 NOTE — Progress Notes (Signed)
Office Visit Note  Patient: Emma Franco             Date of Birth: Jan 27, 1980           MRN: [id number]             PCP: Sinda Du, MD Referring: Sinda Du, MD Visit Date: 06/26/2017 Occupation: @GUAROCC @    Subjective:  Fatigue  History of Present Illness: Emma Franco is a 37 y.o. female continues to complain about fatigue.  She states even simple activities makes her very tired the following day.  She states her hair loss is getting worse.  She has been experiencing generalized pain but not pain in any particular joint.  She denies any joint swelling.  She gives history of oral ulcers which happened approximately every 6 months.  She also notices some photosensitivity.  She denies any history of rash or raynauds phenomenon.  She gives history of sicca symptoms with dry mouth and dry eyes.  Activities of Daily Living:  Patient reports morning stiffness for 1 hour.   Patient Denies nocturnal pain.  Difficulty dressing/grooming: Denies Difficulty climbing stairs: Denies Difficulty getting out of chair: Denies Difficulty using hands for taps, buttons, cutlery, and/or writing: Denies   Review of Systems  Constitutional: Positive for fatigue.  HENT: Positive for mouth sores and mouth dryness. Negative for trouble swallowing and trouble swallowing.   Eyes: Positive for dryness. Negative for pain and visual disturbance.  Respiratory: Negative for cough, hemoptysis, shortness of breath and difficulty breathing.   Cardiovascular: Negative for chest pain, palpitations, hypertension and swelling in legs/feet.  Gastrointestinal: Positive for constipation. Negative for blood in stool and diarrhea.  Endocrine: Negative for increased urination.  Genitourinary: Negative for difficulty urinating and painful urination.  Musculoskeletal: Positive for arthralgias, joint pain, muscle weakness, morning stiffness and muscle tenderness. Negative for joint swelling, myalgias  and myalgias.  Skin: Positive for hair loss and sensitivity to sunlight. Negative for color change, pallor, rash, nodules/bumps, skin tightness and ulcers.  Allergic/Immunologic: Negative for susceptible to infections.  Neurological: Negative for headaches.  Hematological: Negative for bruising/bleeding tendency and swollen glands.  Psychiatric/Behavioral: Positive for sleep disturbance. Negative for depressed mood. The patient is not nervous/anxious.     PMFS History:  Patient Active Problem List   Diagnosis Date Noted  . Autoimmune disease ([place]) 06/26/2017  . History of miscarriage 06/26/2017  . Chronic sinusitis 05/21/2017  . History of anxiety 05/21/2017  . Scoliosis 05/21/2017  . Family history of rheumatoid arthritis 05/21/2017  . Previous cesarean delivery, delivered 10/19/2013  . Acute blood loss anemia 10/17/2013  . Postpartum care following cesarean delivery (10/9) 10/16/2013  . Gastroenteritis 09/11/2013  . ESOPHAGITIS 03/18/2008  . HIATAL HERNIA 03/18/2008  . GERD 02/16/2008  . IRRITABLE BOWEL SYNDROME 02/06/2008  . ARTHRITIS 02/06/2008  . ABDOMINAL PAIN-EPIGASTRIC 02/06/2008    Past Medical History:  Diagnosis Date  . 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 Environmental allergies   . Esophageal reflux   . Headache(784.0) 07/18/11   "qd for the last year; CSF leak; repaired today"  . Hiatal hernia   . Irritable bowel syndrome   . Migraines    "once in a blue moon"  . PONV (postoperative nausea and vomiting)   . Postpartum care following cesarean delivery (10/9) 10/16/2013  . Scoliosis    mild  . Sinusitis, chronic     Family History  Problem Relation Age of Onset  . Stroke Maternal Grandmother   .  Stroke Paternal Grandmother   . Rheum arthritis Mother   . Multiple sclerosis Mother    Past Surgical History:  Procedure Laterality Date  . CESAREAN SECTION  03/2009  . CESAREAN SECTION Bilateral 10/16/2013   Procedure: REPEAT CESAREAN SECTION WITH BILATERAL TUBAL  LIGATION ;  Surgeon: Claiborne Billings A. Pamala Hurry, MD;  Location: [facility];  Service: Obstetrics;  Laterality: Bilateral;   EDD: 10/26/13  . CHOLECYSTECTOMY  12/1996  . NASAL SEPTUM SURGERY  2006  . NASAL SINUS SURGERY  03/2010; 07/18/11  . REPAIR DURAL / CSF LEAK  07/18/11  . SINUS ENDO W/FUSION  07/18/2011   Procedure: ENDOSCOPIC SINUS SURGERY WITH FUSION NAVIGATION;  Surgeon: Ruby Cola, MD;  Location: [facility];  Service: ENT;  Laterality: N/A;  . TONSILLECTOMY  1990   Social History   Social History Narrative  . Not on file     Objective: Vital Signs: BP 116/76 (BP Location: Left Arm, Patient Position: Sitting, Cuff Size: Normal)   Pulse 99   Resp 14   Ht 5\' 5"  (1.651 m)   Wt 160 lb (72.6 kg)   LMP 06/22/2017   BMI 26.63 kg/m    Physical Exam  Constitutional: She is oriented to person, place, and time. She appears well-developed and well-nourished.  HENT:  Head: Normocephalic and atraumatic.  Eyes: Conjunctivae and EOM are normal.  Neck: Normal range of motion.  Cardiovascular: Normal rate, regular rhythm, normal heart sounds and intact distal pulses.  Pulmonary/Chest: Effort normal and breath sounds normal.  Abdominal: Soft. Bowel sounds are normal.  Lymphadenopathy:    She has no cervical adenopathy.  Neurological: She is alert and oriented to person, place, and time.  Skin: Skin is warm and dry. Capillary refill takes less than 2 seconds.  Psychiatric: She has a normal mood and affect. Her behavior is normal.  Nursing note and vitals reviewed.    Musculoskeletal Exam: C-spine thoracic lumbar spine good range of motion.  Shoulder joints elbow joints wrist joint MCPs PIPs DIPs were in good range of motion with no synovitis.  Hip joints knee joints ankles MTPs PIPs DIPs were in good range of motion with no synovitis.  CDAI Exam: No CDAI exam completed.    Investigation: No additional findings. 05/21/17 CBC normal, CMP normal, ANA negative, double-stranded DNA 48 rest of the ENA  negative, anti-CCP negative, 14 3 3  at a negative, C3-C4 normal,  Imaging: No results found.  Speciality Comments: No specialty comments available.    Procedures:  No procedures performed Allergies: Cefdinir and Ciprofloxacin   Assessment / Plan:     Visit Diagnoses: Autoimmune disease-  ANA positive, dsDNA 48, rest of the ENA negative, C3-C4 normal.  History of fatigue, hair loss, arthralgias, oral ulcers, sicca symptoms and photosensitivity .-she continues to have significant fatigue which is interfering with her routine activities.  She also has significant hair thinning on examination today.  She gives history of frequent oral ulcers.  I will obtain plan: Urinalysis, Routine w reflex microscopic, Beta-2 glycoprotein antibodies, Cardiolipin antibodies, IgG, IgM, IgA, Lupus Anticoagulant Eval w/Reflex, Sedimentation rate, Glucose 6 phosphate dehydrogenase.  We had detailed discussion regarding treatment of autoimmune disease.  Indications side effects contraindications were discussed.  A handout on Plaquenil was given and consent was taken.  I will start her on Plaquenil 200 mg p.o. twice daily Monday through Friday.  We will check labs in 1 month and every 3 months to monitor for drug toxicity.  She will need baseline eye exam and  then yearly eye exam.  She has been also advised to get pneumococcal immunization.  Patient was counseled on the purpose, proper use, and adverse effects of hydroxychloroquine including nausea/diarrhea, skin rash, headaches, and sun sensitivity.  Discussed importance of annual eye exams while on hydroxychloroquine to monitor to ocular toxicity and discussed importance of frequent laboratory monitoring.  Provided patient with eye exam form for baseline ophthalmologic exam.  Provided patient with educational materials on hydroxychloroquine and answered all questions.  Patient consented to hydroxychloroquine.  Will upload consent in the media tab.    High risk medication  use-she will get labs in 1 month and every 3 months to monitor for drug toxicity.  Sicca symptoms-she has been using over-the-counter products.  Which were discussed at length.  Family history of rheumatoid arthritis - Mother  Gastroesophageal reflux disease without esophagitis  History of anxiety  Other irritable bowel syndrome  Other chronic sinusitis -  s/p surgery, also complicated by CSF leak, which was repaired  Other fatigue - Plan: CK, TSH    Orders: Orders Placed This Encounter  Procedures  . Urinalysis, Routine w reflex microscopic  . CK  . Beta-2 glycoprotein antibodies  . Cardiolipin antibodies, IgG, IgM, IgA  . Lupus Anticoagulant Eval w/Reflex  . TSH  . Sedimentation rate  . Glucose 6 phosphate dehydrogenase  . CBC with Differential/Platelet  . COMPLETE METABOLIC PANEL WITH GFR   Meds ordered this encounter  Medications  . hydroxychloroquine (PLAQUENIL) 200 MG tablet    Sig: 1 tablet p.o. twice daily Monday through Friday    Dispense:  40 tablet    Refill:  2    Face-to-face time spent with patient was 35 minutes. .>50% of time was spent in counseling and coordination of care.  Follow-Up Instructions: Return in about 3 months (around 09/26/2017) for Autoimmune disease.   Bo Merino, MD  Note - This record has been created using Editor, commissioning.  Chart creation errors have been sought, but may not always  have been located. Such creation errors do not reflect on  the standard of medical care.

## 2017-06-26 ENCOUNTER — Ambulatory Visit: Payer: 59 | Admitting: Rheumatology

## 2017-06-26 ENCOUNTER — Encounter: Payer: Self-pay | Admitting: Rheumatology

## 2017-06-26 VITALS — BP 116/76 | HR 99 | Resp 14 | Ht 65.0 in | Wt 160.0 lb

## 2017-06-26 DIAGNOSIS — Z79899 Other long term (current) drug therapy: Secondary | ICD-10-CM

## 2017-06-26 DIAGNOSIS — K588 Other irritable bowel syndrome: Secondary | ICD-10-CM

## 2017-06-26 DIAGNOSIS — M359 Systemic involvement of connective tissue, unspecified: Secondary | ICD-10-CM

## 2017-06-26 DIAGNOSIS — Z8261 Family history of arthritis: Secondary | ICD-10-CM | POA: Diagnosis not present

## 2017-06-26 DIAGNOSIS — K219 Gastro-esophageal reflux disease without esophagitis: Secondary | ICD-10-CM | POA: Diagnosis not present

## 2017-06-26 DIAGNOSIS — D8989 Other specified disorders involving the immune mechanism, not elsewhere classified: Secondary | ICD-10-CM

## 2017-06-26 DIAGNOSIS — R5383 Other fatigue: Secondary | ICD-10-CM | POA: Diagnosis not present

## 2017-06-26 DIAGNOSIS — J328 Other chronic sinusitis: Secondary | ICD-10-CM

## 2017-06-26 DIAGNOSIS — Z8659 Personal history of other mental and behavioral disorders: Secondary | ICD-10-CM | POA: Diagnosis not present

## 2017-06-26 DIAGNOSIS — Z8759 Personal history of other complications of pregnancy, childbirth and the puerperium: Secondary | ICD-10-CM

## 2017-06-26 DIAGNOSIS — R768 Other specified abnormal immunological findings in serum: Secondary | ICD-10-CM | POA: Diagnosis not present

## 2017-06-26 HISTORY — DX: Personal history of other complications of pregnancy, childbirth and the puerperium: Z87.59

## 2017-06-26 MED ORDER — HYDROXYCHLOROQUINE SULFATE 200 MG PO TABS
ORAL_TABLET | ORAL | 2 refills | Status: DC
Start: 2017-06-26 — End: 2017-08-24

## 2017-06-26 NOTE — Patient Instructions (Signed)
Standing Labs We placed an order today for your standing lab work.    Please come back and get your standing labs in 1 month and every 3 months  We have open lab Monday through Friday from 8:30-11:30 AM and 1:30-4:00 PM  at the office of Dr. Bo Merino.   You may experience shorter wait times on Monday and Friday afternoons. The office is located at [address] [place], [place], [place] [address] No appointment is necessary.   Labs are drawn by [facility].  You may receive a bill from [facility] for your lab work. If you have any questions regarding directions or hours of operation,  please call [phone].    Please get baseline eye examination and then eye exam every year.  Please get pneumococcal vaccine.  Hydroxychloroquine tablets What is this medicine? HYDROXYCHLOROQUINE (hye drox ee KLOR oh kwin) is used to treat rheumatoid arthritis and systemic lupus erythematosus. It is also used to treat malaria. This medicine may be used for other purposes; ask your health care provider or pharmacist if you have questions. COMMON BRAND NAME(S): Plaquenil, Quineprox What should I tell my health care provider before I take this medicine? They need to know if you have any of these conditions: -diabetes -eye disease, vision problems -G6PD deficiency -history of blood diseases -history of irregular heartbeat -if you often drink alcohol -kidney disease -liver disease -porphyria -psoriasis -seizures -an unusual or allergic reaction to chloroquine, hydroxychloroquine, other medicines, foods, dyes, or preservatives -pregnant or trying to get pregnant -breast-feeding How should I use this medicine? Take this medicine by mouth with a glass of water. Follow the directions on the prescription label. Avoid taking antacids within 4 hours of taking this medicine. It is best to separate these medicines by at least 4 hours. Do not cut, crush or chew this medicine. You can take it with or  without food. If it upsets your stomach, take it with food. Take your medicine at regular intervals. Do not take your medicine more often than directed. Take all of your medicine as directed even if you think you are better. Do not skip doses or stop your medicine early. Talk to your pediatrician regarding the use of this medicine in children. While this drug may be prescribed for selected conditions, precautions do apply. Overdosage: If you think you have taken too much of this medicine contact a poison control center or emergency room at once. NOTE: This medicine is only for you. Do not share this medicine with others. What if I miss a dose? If you miss a dose, take it as soon as you can. If it is almost time for your next dose, take only that dose. Do not take double or extra doses. What may interact with this medicine? Do not take this medicine with any of the following medications: -cisapride -dofetilide -dronedarone -live virus vaccines -penicillamine -pimozide -thioridazine -ziprasidone This medicine may also interact with the following medications: -ampicillin -antacids -cimetidine -cyclosporine -digoxin -medicines for diabetes, like insulin, glipizide, glyburide -medicines for seizures like carbamazepine, phenobarbital, phenytoin -mefloquine -methotrexate -other medicines that prolong the QT interval (cause an abnormal heart rhythm) -praziquantel This list may not describe all possible interactions. Give your health care provider a list of all the medicines, herbs, non-prescription drugs, or dietary supplements you use. Also tell them if you smoke, drink alcohol, or use illegal drugs. Some items may interact with your medicine. What should I watch for while using this medicine? Tell your doctor or healthcare professional  if your symptoms do not start to get better or if they get worse. Avoid taking antacids within 4 hours of taking this medicine. It is best to separate these  medicines by at least 4 hours. Tell your doctor or health care professional right away if you have any change in your eyesight. Your vision and blood may be tested before and during use of this medicine. This medicine can make you more sensitive to the sun. Keep out of the sun. If you cannot avoid being in the sun, wear protective clothing and use sunscreen. Do not use sun lamps or tanning beds/booths. What side effects may I notice from receiving this medicine? Side effects that you should report to your doctor or health care professional as soon as possible: -allergic reactions like skin rash, itching or hives, swelling of the face, lips, or tongue -changes in vision -decreased hearing or ringing of the ears -redness, blistering, peeling or loosening of the skin, including inside the mouth -seizures -sensitivity to light -signs and symptoms of a dangerous change in heartbeat or heart rhythm like chest pain; dizziness; fast or irregular heartbeat; palpitations; feeling faint or lightheaded, falls; breathing problems -signs and symptoms of liver injury like dark yellow or brown urine; general ill feeling or flu-like symptoms; light-colored stools; loss of appetite; nausea; right upper belly pain; unusually weak or tired; yellowing of the eyes or skin -signs and symptoms of low blood sugar such as feeling anxious; confusion; dizziness; increased hunger; unusually weak or tired; sweating; shakiness; cold; irritable; headache; blurred vision; fast heartbeat; loss of consciousness -uncontrollable head, mouth, neck, arm, or leg movements Side effects that usually do not require medical attention (report to your doctor or health care professional if they continue or are bothersome): -anxious -diarrhea -dizziness -hair loss -headache -irritable -loss of appetite -nausea, vomiting -stomach pain This list may not describe all possible side effects. Call your doctor for medical advice about side  effects. You may report side effects to FDA at 1-800-FDA-1088. Where should I keep my medicine? Keep out of the reach of children. In children, this medicine can cause overdose with small doses. Store at room temperature between 15 and 30 degrees C (59 and 86 degrees F). Protect from moisture and light. Throw away any unused medicine after the expiration date. NOTE: This sheet is a summary. It may not cover all possible information. If you have questions about this medicine, talk to your doctor, pharmacist, or health care provider.  2018 Elsevier/Gold Standard (2015-08-10 14:16:15)

## 2017-06-28 LAB — LUPUS ANTICOAGULANT EVAL W/ REFLEX
DRVVT: 37 s (ref <=45)
PTT-LA Screen: 34 s (ref <=40)

## 2017-06-28 LAB — URINALYSIS, ROUTINE W REFLEX MICROSCOPIC
Bilirubin Urine: NEGATIVE
Glucose, UA: NEGATIVE
HGB URINE DIPSTICK: NEGATIVE
KETONES UR: NEGATIVE
Leukocytes, UA: NEGATIVE
Nitrite: NEGATIVE
PH: 6.5 (ref 5.0–8.0)
Protein, ur: NEGATIVE
Specific Gravity, Urine: 1.008 (ref 1.001–1.03)

## 2017-06-28 LAB — SEDIMENTATION RATE: Sed Rate: 2 mm/h (ref 0–20)

## 2017-06-28 LAB — BETA-2 GLYCOPROTEIN ANTIBODIES
Beta-2 Glyco 1 IgM: <9 SMU (ref <=20)
Beta-2 Glyco I IgG: <9 SGU (ref <=20)

## 2017-06-28 LAB — CARDIOLIPIN ANTIBODIES, IGG, IGM, IGA
Anticardiolipin IgA: <11 [APL'U]
Anticardiolipin IgM: <12 [MPL'U]

## 2017-06-28 LAB — TSH: TSH: 3.68 m[IU]/L

## 2017-06-28 LAB — CK: Total CK: 69 U/L (ref 29–143)

## 2017-06-28 LAB — GLUCOSE 6 PHOSPHATE DEHYDROGENASE: G-6PDH: 16.5 U/g Hgb (ref 7.0–20.5)

## 2017-06-30 NOTE — Progress Notes (Signed)
Ok to start on PLQ as discussed in the note.

## 2017-07-02 ENCOUNTER — Telehealth: Payer: Self-pay | Admitting: *Deleted

## 2017-07-02 NOTE — Telephone Encounter (Signed)
-----   Message from Bo Merino, MD sent at 06/30/2017  9:50 PM EDT ----- Ok to start on PLQ as discussed in the note.

## 2017-07-23 DIAGNOSIS — Z79899 Other long term (current) drug therapy: Secondary | ICD-10-CM | POA: Diagnosis not present

## 2017-08-01 ENCOUNTER — Other Ambulatory Visit: Payer: Self-pay

## 2017-08-01 DIAGNOSIS — Z79899 Other long term (current) drug therapy: Secondary | ICD-10-CM

## 2017-08-01 LAB — COMPLETE METABOLIC PANEL WITH GFR
AG Ratio: 1.6 (calc) (ref 1.0–2.5)
ALBUMIN MSPROF: 3.9 g/dL (ref 3.6–5.1)
ALKALINE PHOSPHATASE (APISO): 54 U/L (ref 33–115)
ALT: 8 U/L (ref 6–29)
AST: 12 U/L (ref 10–30)
BILIRUBIN TOTAL: 0.4 mg/dL (ref 0.2–1.2)
BUN: 8 mg/dL (ref 7–25)
CHLORIDE: 103 mmol/L (ref 98–110)
CO2: 29 mmol/L (ref 20–32)
Calcium: 9 mg/dL (ref 8.6–10.2)
Creat: 0.75 mg/dL (ref 0.50–1.10)
GFR, Est African American: 117 mL/min/{1.73_m2} (ref >=60)
GFR, Est Non African American: 101 mL/min/{1.73_m2} (ref >=60)
GLUCOSE: 84 mg/dL (ref 65–99)
Globulin: 2.5 g/dL (calc) (ref 1.9–3.7)
Potassium: 4.2 mmol/L (ref 3.5–5.3)
SODIUM: 138 mmol/L (ref 135–146)
Total Protein: 6.4 g/dL (ref 6.1–8.1)

## 2017-08-01 LAB — CBC WITH DIFFERENTIAL/PLATELET
Basophils Absolute: 72 cells/uL (ref 0–200)
Basophils Relative: 1.1 %
EOS PCT: 8 %
Eosinophils Absolute: 520 cells/uL — ABNORMAL HIGH (ref 15–500)
HCT: 39.6 % (ref 35.0–45.0)
Hemoglobin: 12.8 g/dL (ref 11.7–15.5)
Lymphs Abs: 1989 cells/uL (ref 850–3900)
MCH: 27.2 pg (ref 27.0–33.0)
MCHC: 32.3 g/dL (ref 32.0–36.0)
MCV: 84.1 fL (ref 80.0–100.0)
MONOS PCT: 10.6 %
MPV: 10.7 fL (ref 7.5–12.5)
NEUTROS PCT: 49.7 %
Neutro Abs: 3231 cells/uL (ref 1500–7800)
PLATELETS: 251 10*3/uL (ref 140–400)
RBC: 4.71 10*6/uL (ref 3.80–5.10)
RDW: 12.9 % (ref 11.0–15.0)
TOTAL LYMPHOCYTE: 30.6 %
WBC mixed population: 689 cells/uL (ref 200–950)
WBC: 6.5 10*3/uL (ref 3.8–10.8)

## 2017-08-24 ENCOUNTER — Other Ambulatory Visit: Payer: Self-pay | Admitting: Rheumatology

## 2017-08-26 NOTE — Telephone Encounter (Signed)
Last visit: 06/26/17 Next Visit: 09/24/17 Labs: 08/01/17 CMP WNL. CBC stable.  Left message to advise patient we need her PLQ eye exam. We have not received baseline PLQ eye exam.   Okay to refill 30 day supply  PLQ?

## 2017-08-27 NOTE — Telephone Encounter (Signed)
ok 

## 2017-09-10 NOTE — Progress Notes (Signed)
Office Visit Note  Patient: Emma Franco             Date of Birth: 06-May-1980           MRN: [id number]             PCP: Sinda Du, MD Referring: Sinda Du, MD Visit Date: 09/24/2017 Occupation: @GUAROCC @  Subjective:  Fatigue and shortness of breath.   History of Present Illness: Emma Franco is a 37 y.o. female with history of systemic lupus.  She has been on Plaquenil since mid June.  She has noticed some improvement in her fatigue.  She still continues to have some shortness of breath.  She states she also has episodes of abdominal discomfort and diarrhea off and on.  She has long-standing history of IBS.  Which was in remission until recently.  She is uncertain if these symptoms are related to the Plaquenil use.  Had one episode of rash since she has a started Plaquenil on her neck.  It did not last very long.  She had no recurrence of rash.  Is no history of joint swelling.  Noticed improvement in joint pain.  She continues to have dry mouth but has not had any recurrence of oral ulcers.  Activities of Daily Living:  Patient reports morning stiffness for 15-20  minutes.   Patient Reports nocturnal pain.  Difficulty dressing/grooming: Denies Difficulty climbing stairs: Denies Difficulty getting out of chair: Denies Difficulty using hands for taps, buttons, cutlery, and/or writing: Denies  Review of Systems  Constitutional: Positive for fatigue. Negative for night sweats, weight gain and weight loss.  HENT: Positive for mouth dryness. Negative for mouth sores, trouble swallowing, trouble swallowing and nose dryness.   Eyes: Positive for dryness. Negative for pain, redness and visual disturbance.  Respiratory: Positive for cough and shortness of breath. Negative for difficulty breathing.   Cardiovascular: Negative for chest pain, palpitations, hypertension, irregular heartbeat and swelling in legs/feet.  Gastrointestinal: Positive for constipation and  diarrhea. Negative for blood in stool.  Endocrine: Negative for increased urination.  Genitourinary: Negative for difficulty urinating and vaginal dryness.  Musculoskeletal: Positive for muscle weakness and morning stiffness. Negative for arthralgias, joint pain, joint swelling, myalgias, muscle tenderness and myalgias.  Skin: Positive for hair loss. Negative for color change, rash, skin tightness, ulcers and sensitivity to sunlight.  Allergic/Immunologic: Negative for susceptible to infections.  Neurological: Negative for dizziness, numbness, headaches, memory loss, night sweats and weakness.  Hematological: Negative for swollen glands.  Psychiatric/Behavioral: Positive for sleep disturbance. Negative for depressed mood. The patient is nervous/anxious.     PMFS History:  Patient Active Problem List   Diagnosis Date Noted  . Autoimmune disease ([place]) 06/26/2017  . History of miscarriage 06/26/2017  . Chronic sinusitis 05/21/2017  . History of anxiety 05/21/2017  . Scoliosis 05/21/2017  . Family history of rheumatoid arthritis 05/21/2017  . Previous cesarean delivery, delivered 10/19/2013  . Acute blood loss anemia 10/17/2013  . Postpartum care following cesarean delivery (10/9) 10/16/2013  . Gastroenteritis 09/11/2013  . ESOPHAGITIS 03/18/2008  . HIATAL HERNIA 03/18/2008  . GERD 02/16/2008  . IRRITABLE BOWEL SYNDROME 02/06/2008  . ARTHRITIS 02/06/2008  . ABDOMINAL PAIN-EPIGASTRIC 02/06/2008    Past Medical History:  Diagnosis Date  . 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 Environmental allergies   . Esophageal reflux   . Headache(784.0) 07/18/11   "qd for the last year; CSF leak; repaired today"  . Hiatal hernia   .  Irritable bowel syndrome   . Migraines    "once in a blue moon"  . PONV (postoperative nausea and vomiting)   . Postpartum care following cesarean delivery (10/9) 10/16/2013  . Scoliosis    mild  . Sinusitis, chronic     Family History  Problem Relation Age of Onset    . Stroke Maternal Grandmother   . Stroke Paternal Grandmother   . Rheum arthritis Mother   . Multiple sclerosis Mother    Past Surgical History:  Procedure Laterality Date  . CESAREAN SECTION  03/2009  . CESAREAN SECTION Bilateral 10/16/2013   Procedure: REPEAT CESAREAN SECTION WITH BILATERAL TUBAL LIGATION ;  Surgeon: Claiborne Billings A. Pamala Hurry, MD;  Location: [facility];  Service: Obstetrics;  Laterality: Bilateral;   EDD: 10/26/13  . CHOLECYSTECTOMY  12/1996  . NASAL SEPTUM SURGERY  2006  . NASAL SINUS SURGERY  03/2010; 07/18/11  . REPAIR DURAL / CSF LEAK  07/18/11  . SINUS ENDO W/FUSION  07/18/2011   Procedure: ENDOSCOPIC SINUS SURGERY WITH FUSION NAVIGATION;  Surgeon: Ruby Cola, MD;  Location: [facility];  Service: ENT;  Laterality: N/A;  . TONSILLECTOMY  1990   Social History   Social History Narrative  . Not on file    Objective: Vital Signs: BP 123/87 (BP Location: Left Arm, Patient Position: Sitting, Cuff Size: Normal)   Pulse 93   Ht 5\' 5"  (1.651 m)   Wt 161 lb (73 kg)   BMI 26.79 kg/m    Physical Exam  Constitutional: She is oriented to person, place, and time. She appears well-developed and well-nourished.  HENT:  Head: Normocephalic and atraumatic.  Eyes: Conjunctivae and EOM are normal.  He had tenderness on palpation of her maxillary sinuses.  Neck: Normal range of motion.  Cardiovascular: Normal rate, regular rhythm, normal heart sounds and intact distal pulses.  Pulmonary/Chest: Effort normal and breath sounds normal.  Abdominal: Soft. Bowel sounds are normal.  She has some tenderness in the epigastric region.  Lymphadenopathy:    She has no cervical adenopathy.  Neurological: She is alert and oriented to person, place, and time.  Skin: Skin is warm and dry. Capillary refill takes less than 2 seconds.  Psychiatric: She has a normal mood and affect. Her behavior is normal.  Nursing note and vitals reviewed.    Musculoskeletal Exam: Spine thoracic lumbar spine good  range of motion.  She does have lumbar scoliosis.  Shoulder joints elbow joints wrist joint MCPs PIPs DIPs were in good range of motion with no synovitis.  Hip joints knee joints ankles MTPs PIPs were in good range of motion with no synovitis.  CDAI Exam: CDAI Score: Not documented Patient Global Assessment: Not documented; Provider Global Assessment: Not documented Swollen: 0 ; Tender: 0  Joint Exam   Not documented   There is currently no information documented on the homunculus. Go to the Rheumatology activity and complete the homunculus joint exam.  Investigation: No additional findings.  Imaging: No results found.  Recent Labs: Lab Results  Component Value Date   WBC 6.5 08/01/2017   HGB 12.8 08/01/2017   PLT 251 08/01/2017   NA 138 08/01/2017   K 4.2 08/01/2017   CL 103 08/01/2017   CO2 29 08/01/2017   GLUCOSE 84 08/01/2017   BUN 8 08/01/2017   CREATININE 0.75 08/01/2017   BILITOT 0.4 08/01/2017   ALKPHOS 131 (H) 09/11/2013   AST 12 08/01/2017   ALT 8 08/01/2017   PROT 6.4 08/01/2017   ALBUMIN  2.7 (L) 09/11/2013   CALCIUM 9.0 08/01/2017   GFRAA 117 08/01/2017    Speciality Comments: PLQ Eye Exam: 07/23/17 WNL with Susanne Greenhouse OD PA  Procedures:  No procedures performed Allergies: Cefdinir and Ciprofloxacin   Assessment / Plan:     Visit Diagnoses: Autoimmune disease ([place]) - ANA positive, dsDNA 48, rest of the ENA negative, C3-C4 normal.  History of fatigue, hair loss, arthralgias, oral ulcers, sicca symptoms and photosensitivity.  She has noticed improvement in her symptoms since she has been on Plaquenil.  The fatigue and arthralgias have resolved.  She continues to have some sicca symptoms.  She had one episode of rash which is resolved.  High risk medication use - PLQ 200 mg p.o. twice daily Monday through Friday.  Labs are due next month.  Other fatigue-improved.  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  Scoliosis, unspecified  scoliosis type, unspecified spinal region-she has some chronic lower back discomfort.  Other irritable bowel syndrome-she has been having increased episodes of diarrhea and abdominal discomfort.  She also had some epigastric tenderness.  Have advised her to schedule appointment with gastroenterology.  History of anxiety  Gastroesophageal reflux disease without esophagitis -I am uncertain if the sinus congestion is coming from her chronic sinusitis or reflux.  She also has history of some shortness of breath.  She states she has appointment coming up with her PCP.  Her lungs were clear to auscultation.  Orders: No orders of the defined types were placed in this encounter.  No orders of the defined types were placed in this encounter.   Face-to-face time spent with patient was 30 minutes. Greater than 50% of time was spent in counseling and coordination of care.  Follow-Up Instructions: Return in about 3 months (around 12/24/2017) for Systemic lupus.   Bo Merino, MD  Note - This record has been created using Editor, commissioning.  Chart creation errors have been sought, but may not always  have been located. Such creation errors do not reflect on  the standard of medical care.

## 2017-09-24 ENCOUNTER — Ambulatory Visit: Payer: 59 | Admitting: Rheumatology

## 2017-09-24 ENCOUNTER — Encounter: Payer: Self-pay | Admitting: Physician Assistant

## 2017-09-24 VITALS — BP 123/87 | HR 93 | Ht 65.0 in | Wt 161.0 lb

## 2017-09-24 DIAGNOSIS — L930 Discoid lupus erythematosus: Secondary | ICD-10-CM | POA: Diagnosis not present

## 2017-09-24 DIAGNOSIS — M3219 Other organ or system involvement in systemic lupus erythematosus: Secondary | ICD-10-CM | POA: Diagnosis not present

## 2017-09-24 DIAGNOSIS — K219 Gastro-esophageal reflux disease without esophagitis: Secondary | ICD-10-CM

## 2017-09-24 DIAGNOSIS — M419 Scoliosis, unspecified: Secondary | ICD-10-CM

## 2017-09-24 DIAGNOSIS — R5383 Other fatigue: Secondary | ICD-10-CM

## 2017-09-24 DIAGNOSIS — K588 Other irritable bowel syndrome: Secondary | ICD-10-CM

## 2017-09-24 DIAGNOSIS — Z79899 Other long term (current) drug therapy: Secondary | ICD-10-CM | POA: Diagnosis not present

## 2017-09-24 DIAGNOSIS — Z8659 Personal history of other mental and behavioral disorders: Secondary | ICD-10-CM

## 2017-09-30 DIAGNOSIS — R0602 Shortness of breath: Secondary | ICD-10-CM | POA: Diagnosis not present

## 2017-09-30 DIAGNOSIS — J301 Allergic rhinitis due to pollen: Secondary | ICD-10-CM | POA: Diagnosis not present

## 2017-09-30 DIAGNOSIS — M329 Systemic lupus erythematosus, unspecified: Secondary | ICD-10-CM | POA: Diagnosis not present

## 2017-09-30 DIAGNOSIS — Z23 Encounter for immunization: Secondary | ICD-10-CM | POA: Diagnosis not present

## 2017-10-01 ENCOUNTER — Other Ambulatory Visit (HOSPITAL_COMMUNITY): Payer: Self-pay | Admitting: Respiratory Therapy

## 2017-10-01 DIAGNOSIS — R0602 Shortness of breath: Secondary | ICD-10-CM

## 2017-10-02 ENCOUNTER — Emergency Department (HOSPITAL_COMMUNITY)
Admission: EM | Admit: 2017-10-02 | Discharge: 2017-10-02 | Disposition: A | Discharge status: Home / Self Care | Payer: 59 | Attending: Emergency Medicine | Admitting: Emergency Medicine

## 2017-10-02 ENCOUNTER — Encounter (HOSPITAL_COMMUNITY): Payer: Self-pay | Admitting: Emergency Medicine

## 2017-10-02 ENCOUNTER — Emergency Department (HOSPITAL_COMMUNITY): Payer: 59

## 2017-10-02 ENCOUNTER — Other Ambulatory Visit: Payer: Self-pay

## 2017-10-02 DIAGNOSIS — R0602 Shortness of breath: Secondary | ICD-10-CM | POA: Insufficient documentation

## 2017-10-02 DIAGNOSIS — R0789 Other chest pain: Secondary | ICD-10-CM | POA: Diagnosis not present

## 2017-10-02 DIAGNOSIS — Z79899 Other long term (current) drug therapy: Secondary | ICD-10-CM | POA: Insufficient documentation

## 2017-10-02 HISTORY — DX: Systemic lupus erythematosus, unspecified: M32.9

## 2017-10-02 HISTORY — DX: Reserved for concepts with insufficient information to code with codable children: IMO0002

## 2017-10-02 LAB — CBC WITH DIFFERENTIAL/PLATELET
BASOS ABS: 0 10*3/uL (ref 0.0–0.1)
Basophils Relative: 1 %
EOS PCT: 6 %
Eosinophils Absolute: 0.3 10*3/uL (ref 0.0–0.7)
HCT: 38.8 % (ref 36.0–46.0)
HEMOGLOBIN: 12.9 g/dL (ref 12.0–15.0)
LYMPHS ABS: 1.4 10*3/uL (ref 0.7–4.0)
LYMPHS PCT: 27 %
MCH: 27.4 pg (ref 26.0–34.0)
MCHC: 33.2 g/dL (ref 30.0–36.0)
MCV: 82.4 fL (ref 78.0–100.0)
Monocytes Absolute: 0.5 10*3/uL (ref 0.1–1.0)
Monocytes Relative: 10 %
NEUTROS PCT: 56 %
Neutro Abs: 3 10*3/uL (ref 1.7–7.7)
PLATELETS: 219 10*3/uL (ref 150–400)
RBC: 4.71 MIL/uL (ref 3.87–5.11)
RDW: 13.8 % (ref 11.5–15.5)
WBC: 5.2 10*3/uL (ref 4.0–10.5)

## 2017-10-02 LAB — BLOOD GAS, ARTERIAL
ACID-BASE DEFICIT: 3.5 mmol/L — AB (ref 0.0–2.0)
Bicarbonate: 22.9 mmol/L (ref 20.0–28.0)
Drawn by: 277331
FIO2: 0.21
O2 Saturation: 99.2 %
PATIENT TEMPERATURE: 37
PCO2 ART: 22.1 mmHg — AB (ref 32.0–48.0)
PO2 ART: 123 mmHg — AB (ref 83.0–108.0)
pH, Arterial: 7.539 — ABNORMAL HIGH (ref 7.350–7.450)

## 2017-10-02 LAB — BASIC METABOLIC PANEL
ANION GAP: 11 (ref 5–15)
BUN: 5 mg/dL — AB (ref 6–20)
CHLORIDE: 107 mmol/L (ref 98–111)
CO2: 20 mmol/L — ABNORMAL LOW (ref 22–32)
Calcium: 9.1 mg/dL (ref 8.9–10.3)
Creatinine, Ser: 0.8 mg/dL (ref 0.44–1.00)
GFR calc Af Amer: >60 mL/min (ref >60)
GLUCOSE: 104 mg/dL — AB (ref 70–99)
POTASSIUM: 3.1 mmol/L — AB (ref 3.5–5.1)
SODIUM: 138 mmol/L (ref 135–145)

## 2017-10-02 LAB — I-STAT TROPONIN, ED: TROPONIN I, POC: 0 ng/mL (ref 0.00–0.08)

## 2017-10-02 LAB — I-STAT BETA HCG BLOOD, ED (MC, WL, AP ONLY): I-stat hCG, quantitative: <5 m[IU]/mL (ref <5)

## 2017-10-02 LAB — BRAIN NATRIURETIC PEPTIDE: B NATRIURETIC PEPTIDE 5: 46 pg/mL (ref 0.0–100.0)

## 2017-10-02 MED ORDER — POTASSIUM CHLORIDE CRYS ER 20 MEQ PO TBCR
40.0000 meq | EXTENDED_RELEASE_TABLET | Freq: Once | ORAL | Status: AC
  Administered 2017-10-02: 40 meq via ORAL
  Filled 2017-10-02: qty 2

## 2017-10-02 MED ORDER — IOPAMIDOL (ISOVUE-370) INJECTION 76%
100.0000 mL | Freq: Once | INTRAVENOUS | Status: AC | PRN
  Administered 2017-10-02: 100 mL via INTRAVENOUS

## 2017-10-02 MED ORDER — LORAZEPAM 2 MG/ML IJ SOLN
0.5000 mg | Freq: Once | INTRAMUSCULAR | Status: AC
  Administered 2017-10-02: 0.5 mg via INTRAVENOUS
  Filled 2017-10-02: qty 1

## 2017-10-02 NOTE — ED Notes (Addendum)
Pt ambulated to bathroom. Room air saturation 96-98%. Pt dyspneic and reports intermittent hand numbness and lightheadedness. Pt ambulated with steady gait. PA and primary RN aware.

## 2017-10-02 NOTE — ED Triage Notes (Signed)
Per patient recently diagnosed with Lupus past few months. Pt reports increased SOB since Monday. Pt states exertion and talking makes it worst. Chest pain slightly in center.

## 2017-10-02 NOTE — ED Provider Notes (Signed)
[facility] EMERGENCY DEPARTMENT Provider Note   CSN: [id number] Arrival date & time: 10/02/17  0803     History   Chief Complaint Chief Complaint  Patient presents with  . Shortness of Breath    HPI Emma Franco is a 37 y.o. female.  HPI   Pt is a 37 y/o female with a h/o GERD, IBS, hiatal hernia, autoimmune disease who presents to the ED today c/o SOB.  She states that she has felt short of breath and had a cough with mucus production for the last several months however for the last 3 days her shortness of breath has seemed to worsen and she becomes very short of breath even walking short distances.  She reports burning sensation to her throat and upper chest.  Also reports chest tightness.  Denies hemoptysis, bilateral lower extremity swelling, calf pain, recent surgeries, recent admissions, history of DVT/PE or cancer.  Denies fevers, chills or other URI sxs.  Reviewed records. Pt with h/o autoimmune disease and started plaquenil 06/2017. Pt states she was diagnosed with lupus.  Past Medical History:  Diagnosis Date  . 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 Environmental allergies   . Esophageal reflux   . Headache(784.0) 07/18/11   "qd for the last year; CSF leak; repaired today"  . Hiatal hernia   . Irritable bowel syndrome   . Lupus ([place])   . Migraines    "once in a blue moon"  . PONV (postoperative nausea and vomiting)   . Postpartum care following cesarean delivery (10/9) 10/16/2013  . Scoliosis    mild  . Sinusitis, chronic     Patient Active Problem List   Diagnosis Date Noted  . Autoimmune disease ([place]) 06/26/2017  . History of miscarriage 06/26/2017  . Chronic sinusitis 05/21/2017  . History of anxiety 05/21/2017  . Scoliosis 05/21/2017  . Family history of rheumatoid arthritis 05/21/2017  . Previous cesarean delivery, delivered 10/19/2013  . Acute blood loss anemia 10/17/2013  . Postpartum care following cesarean delivery (10/9) 10/16/2013  .  Gastroenteritis 09/11/2013  . ESOPHAGITIS 03/18/2008  . HIATAL HERNIA 03/18/2008  . GERD 02/16/2008  . IRRITABLE BOWEL SYNDROME 02/06/2008  . ARTHRITIS 02/06/2008  . ABDOMINAL PAIN-EPIGASTRIC 02/06/2008    Past Surgical History:  Procedure Laterality Date  . CESAREAN SECTION  03/2009  . CESAREAN SECTION Bilateral 10/16/2013   Procedure: REPEAT CESAREAN SECTION WITH BILATERAL TUBAL LIGATION ;  Surgeon: Claiborne Billings A. Pamala Hurry, MD;  Location: [facility];  Service: Obstetrics;  Laterality: Bilateral;   EDD: 10/26/13  . CHOLECYSTECTOMY  12/1996  . NASAL SEPTUM SURGERY  2006  . NASAL SINUS SURGERY  03/2010; 07/18/11  . REPAIR DURAL / CSF LEAK  07/18/11  . SINUS ENDO W/FUSION  07/18/2011   Procedure: ENDOSCOPIC SINUS SURGERY WITH FUSION NAVIGATION;  Surgeon: Ruby Cola, MD;  Location: [facility];  Service: ENT;  Laterality: N/A;  . TONSILLECTOMY  1990     OB History    Gravida  3   Para  2   Term  2   Preterm      AB  1   Living  2     SAB  1   TAB      Ectopic      Multiple      Live Births  1            Home Medications    Prior to Admission medications   Medication Sig Start Date End Date  Taking? Authorizing Provider  buPROPion (WELLBUTRIN XL) 150 MG 24 hr tablet Take 150 mg by mouth daily.  04/08/17  Yes [provider]  cetirizine (ZYRTEC) 10 MG tablet Take 10 mg by mouth daily.   Yes [provider]  fluticasone (FLONASE) 50 MCG/ACT nasal spray Place 1 spray into the nose daily.  07/04/15  Yes [provider]  hydroxychloroquine (PLAQUENIL) 200 MG tablet TAKE 1 TABLET TWICE DAILY MONDAY THROUGH FRIDAY. 08/27/17  Yes Deveshwar, Abel Presto, MD  sertraline (ZOLOFT) 50 MG tablet Take 1 tablet by mouth daily. 09/30/17  Yes [provider]  VASCEPA 1 g CAPS Take 2 capsules by mouth 2 (two) times daily. 09/30/17  Yes [provider]    Family History Family History  Problem Relation Age of Onset  . Stroke Maternal Grandmother   . Stroke  Paternal Grandmother   . Rheum arthritis Mother   . Multiple sclerosis Mother     Social History Social History   Tobacco Use  . Smoking status: Never Smoker  . Smokeless tobacco: Never Used  Substance Use Topics  . Alcohol use: No  . Drug use: No     Allergies   Cefdinir and Ciprofloxacin   Review of Systems Review of Systems  Constitutional: Negative for chills and fever.  HENT: Negative for congestion, ear pain, rhinorrhea and sore throat.   Eyes: Negative for pain and visual disturbance.  Respiratory: Positive for cough and shortness of breath.   Cardiovascular: Positive for chest pain. Negative for palpitations and leg swelling.  Gastrointestinal: Negative for abdominal pain, constipation, diarrhea, nausea and vomiting.  Genitourinary: Negative for dysuria and hematuria.  Musculoskeletal: Negative for back pain.  Skin: Negative for rash.  Neurological: Negative for dizziness and light-headedness.  All other systems reviewed and are negative.   Physical Exam Updated Vital Signs BP 126/81   Pulse 94   Temp 97.7 F (36.5 C) (Oral)   Resp 16   Ht 5\' 5"  (1.651 m)   Wt 72.6 kg   LMP 10/02/2017   SpO2 99%   BMI 26.63 kg/m   Physical Exam  Constitutional: She appears well-developed and well-nourished.  HENT:  Head: Normocephalic and atraumatic.  Mouth/Throat: Oropharynx is clear and moist.  Eyes: Conjunctivae are normal.  Neck: Neck supple.  Cardiovascular: Regular rhythm, normal heart sounds and intact distal pulses.  No murmur heard. Tachycardic 105-110 on monitor  Pulmonary/Chest: Breath sounds normal. She has no decreased breath sounds. She has no wheezes. She has no rhonchi. She has no rales.  tachypneic in the low 20s, though speaking in full sentences. Lungs CTA bilaterally.  Abdominal: Soft. There is no tenderness.  Musculoskeletal: She exhibits no edema.       Right lower leg: Normal. She exhibits no tenderness and no edema.       Left lower leg:  Normal. She exhibits no tenderness and no edema.  Neurological: She is alert.  Skin: Skin is warm and dry. Capillary refill takes less than 2 seconds.  Psychiatric:  Anxious, tearful  Nursing note and vitals reviewed.    ED Treatments / Results  Labs (all labs ordered are listed, but only abnormal results are displayed) Labs Reviewed  BASIC METABOLIC PANEL - Abnormal; Notable for the following components:      Result Value   Potassium 3.1 (*)    CO2 20 (*)    Glucose, Bld 104 (*)    BUN 5 (*)    All other components within normal limits  BLOOD GAS, ARTERIAL - Abnormal; Notable for the following components:   pH, Arterial 7.539 (*)    pCO2 arterial 22.1 (*)    pO2, Arterial 123 (*)    Acid-base deficit 3.5 (*)    All other components within normal limits  CBC WITH DIFFERENTIAL/PLATELET  BRAIN NATRIURETIC PEPTIDE  I-STAT BETA HCG BLOOD, ED ([facility], [facility], [facility] ONLY)  I-STAT TROPONIN, ED    EKG EKG Interpretation  Date/Time:  Wednesday October 02 2017 08:14:39 EDT Ventricular Rate:  105 PR Interval:    QRS Duration: 94 QT Interval:  344 QTC Calculation: 455 R Axis:   75 Text Interpretation:  Sinus tachycardia Baseline wander in lead(s) II III aVL aVF V3 V4 Confirmed by Davonna Belling [phone]) on 10/02/2017 8:16:57 AM   Radiology Dg Chest 2 View  Result Date: 10/02/2017 CLINICAL DATA:  Shortness of breath.  Systemic lupus erythematosus EXAM: CHEST - 2 VIEW COMPARISON:  March 23, 2017 FINDINGS: No edema or consolidation. Heart size and pulmonary vascularity are normal. No adenopathy. There is lower thoracic levoscoliosis. IMPRESSION: No edema or consolidation.  No evident adenopathy. Electronically Signed   By: Lowella Grip III M.D.   On: 10/02/2017 08:58   Ct Angio Chest Pe W And/or Wo Contrast  Result Date: 10/02/2017 CLINICAL DATA:  Shortness of breath EXAM: CT ANGIOGRAPHY CHEST WITH CONTRAST TECHNIQUE: Multidetector CT imaging of the chest was performed using the  standard protocol during bolus administration of intravenous contrast. Multiplanar CT image reconstructions and MIPs were obtained to evaluate the vascular anatomy. CONTRAST:  141mL ISOVUE-370 IOPAMIDOL (ISOVUE-370) INJECTION 76% COMPARISON:  Chest radiograph October 02, 2017 FINDINGS: Cardiovascular: There is no demonstrable pulmonary embolus. There is no thoracic aortic aneurysm or dissection. There is no evident pericardial effusion or pericardial thickening. Mediastinum/Nodes: Thyroid appears unremarkable. There is no thoracic adenopathy. No esophageal lesions are evident. Lungs/Pleura: There is no edema or consolidation. No pleural effusion or pleural thickening evident. There is mild atelectatic change in the right upper lobe. Upper Abdomen: Gallbladder is absent. Upper abdominal structures otherwise appear unremarkable. Musculoskeletal: There is mild midthoracic dextroscoliosis with lower thoracic levoscoliosis. There are no blastic or lytic bone lesions. No chest wall lesions evident. Review of the MIP images confirms the above findings. IMPRESSION: 1. No demonstrable pulmonary embolus. No thoracic aortic aneurysm or dissection. 2.  Slight right upper lobe atelectasis.  No edema or consolidation. 3.  No evident adenopathy. 4.  Gallbladder absent. Electronically Signed   By: Lowella Grip III M.D.   On: 10/02/2017 09:20    Procedures Procedures (including critical care time)  Medications Ordered in ED Medications  potassium chloride SA (K-DUR,KLOR-CON) CR tablet 40 mEq (40 mEq Oral Given 10/02/17 0913)  iopamidol (ISOVUE-370) 76 % injection 100 mL (100 mLs Intravenous Contrast Given 10/02/17 0854)  LORazepam (ATIVAN) injection 0.5 mg (0.5 mg Intravenous Given 10/02/17 1350)     Initial Impression / Assessment and Plan / ED Course  I have reviewed the triage vital signs and the nursing notes.  Pertinent labs & imaging results that were available during my care of the patient were reviewed  by me and considered in my medical decision making (see chart for details).  Discussed pt presentation and exam findings with Dr. Alvino Chapel, who agrees with the current workup and plan. He evaluated the pt and recommended calling the patients primary doctor as he is also a pulmonologist.  Pt ambulated and maintained sats to >95% on RA.   Final Clinical Impressions(s) / ED Diagnoses  Final diagnoses:  SOB (shortness of breath)   Pt with h/o SLE presenting with multiweek h/o SOB and DOE acutely worsening over the last 3 days. Associated with a feeling of chest tightness/burning. No LE swelling and no obvious signs of fluid overload on exam. Recently started on plaquenil so increased risk for cardiomyopathy. Increased risk for PE with h/o SLE.   Will obtain labs, trop, bnp, ecg, and cxr. Will also plan for CTA given increased risk for PE.  CBC with no leukocytosis or anemia. BMP with mild hypokalemia, supplemented in ED. Low bicarb at 20, secondary to hyperventilation. Troponin negative BNP negative   ECG with sinus tachycardia, baseline wander leads in II, III, aVL, aVF, V3, and V4.  CXR with no acute findings. No PNA or PTX.   CTA without evidence of pulmonary embolism. No edema or consolidation. No pericardial effusion.   12:32 PM attempted to contact the pt's primary physician, Dr. Luan Pulling and was sent to voicemail.  12:56 PM Discussed case with Dr. Luan Pulling. He does report that pt has had some increasing anxiety since she was diagnosed with lupus and is not sure if this could be contributing. He recommended ordering a blood gas. He will order PFTs as outpt and see the pt in the office tomorrow.  Re-evaluated pt. Discussed plan. States she does not feel much different after ativan, states she feels possibly slightly improved. She does appear less anxious and less tachypneic. RR <20 on monitor. ABG consistent with hyperventilation. Feel that pt has been reasonably screened and has no  evidence of emergent/life threatening cardiac/pulmonary etiology of sxs that would require further workup or admission to the hospital. Pt safe to f/u as outpt tomorrow with her pcp who plans to complete pfts. Advised to return to the ED for new or worsening sxs. Pt and mother at bedside understands plan and reasons to return. All questions answered.   ED Discharge Orders    None       Bishop Dublin 10/02/17 1432    Davonna Belling, MD 10/02/17 1511

## 2017-10-02 NOTE — Discharge Instructions (Signed)
Please follow up in Dr. Luan Pulling office tomorrow for an appointment.   Please follow up with your primary care provider within 5-7 days for re-evaluation of your symptoms. Please return to the emergency department for any new or worsening symptoms.

## 2017-10-03 ENCOUNTER — Other Ambulatory Visit (HOSPITAL_COMMUNITY): Payer: Self-pay | Admitting: Pulmonary Disease

## 2017-10-03 DIAGNOSIS — R0602 Shortness of breath: Secondary | ICD-10-CM

## 2017-10-03 DIAGNOSIS — J309 Allergic rhinitis, unspecified: Secondary | ICD-10-CM | POA: Diagnosis not present

## 2017-10-08 ENCOUNTER — Ambulatory Visit (HOSPITAL_COMMUNITY)
Admission: RE | Admit: 2017-10-08 | Discharge: 2017-10-08 | Disposition: A | Discharge status: Home / Self Care | Payer: 59 | Source: Ambulatory Visit | Attending: Pulmonary Disease | Admitting: Pulmonary Disease

## 2017-10-08 DIAGNOSIS — R0602 Shortness of breath: Secondary | ICD-10-CM

## 2017-10-08 DIAGNOSIS — M329 Systemic lupus erythematosus, unspecified: Secondary | ICD-10-CM | POA: Insufficient documentation

## 2017-10-08 DIAGNOSIS — D649 Anemia, unspecified: Secondary | ICD-10-CM | POA: Insufficient documentation

## 2017-10-08 NOTE — Progress Notes (Signed)
*  PRELIMINARY RESULTS* Echocardiogram 2D Echocardiogram has been performed.  Leavy Cella 10/08/2017, 1:58 PM

## 2017-10-09 ENCOUNTER — Ambulatory Visit (HOSPITAL_COMMUNITY)
Admission: RE | Admit: 2017-10-09 | Discharge: 2017-10-09 | Disposition: A | Discharge status: Home / Self Care | Payer: 59 | Source: Ambulatory Visit | Attending: Pulmonary Disease | Admitting: Pulmonary Disease

## 2017-10-09 DIAGNOSIS — R0602 Shortness of breath: Secondary | ICD-10-CM | POA: Diagnosis not present

## 2017-10-09 LAB — PULMONARY FUNCTION TEST
DL/VA % pred: 96 %
DL/VA: 4.86 ml/min/mmHg/L
DLCO COR % PRED: 95 %
DLCO UNC % PRED: 93 %
DLCO cor: 25.63 ml/min/mmHg
DLCO unc: 25.23 ml/min/mmHg
FEF 25-75 POST: 3.16 L/s
FEF 25-75 PRE: 2.65 L/s
FEF2575-%CHANGE-POST: 19 %
FEF2575-%PRED-POST: 95 %
FEF2575-%Pred-Pre: 80 %
FEV1-%Change-Post: 4 %
FEV1-%Pred-Post: 97 %
FEV1-%Pred-Pre: 94 %
FEV1-Post: 3.18 L
FEV1-Pre: 3.05 L
FEV1FVC-%CHANGE-POST: 1 %
FEV1FVC-%PRED-PRE: 91 %
FEV6-%Change-Post: 1 %
FEV6-%PRED-POST: 102 %
FEV6-%Pred-Pre: 101 %
FEV6-Post: 4 L
FEV6-Pre: 3.96 L
FEV6FVC-%CHANGE-POST: 0 %
FEV6FVC-%Pred-Post: 101 %
FEV6FVC-%Pred-Pre: 100 %
FVC-%Change-Post: 2 %
FVC-%Pred-Post: 103 %
FVC-%Pred-Pre: 101 %
FVC-Post: 4.1 L
FVC-Pre: 4.01 L
POST FEV1/FVC RATIO: 77 %
PRE FEV6/FVC RATIO: 99 %
Post FEV6/FVC ratio: 100 %
Pre FEV1/FVC ratio: 76 %
RV % pred: 133 %
RV: 2.21 L
TLC % pred: 117 %
TLC: 6.29 L

## 2017-10-09 MED ORDER — ALBUTEROL SULFATE (2.5 MG/3ML) 0.083% IN NEBU
2.5000 mg | INHALATION_SOLUTION | Freq: Once | RESPIRATORY_TRACT | Status: AC
  Administered 2017-10-09: 2.5 mg via RESPIRATORY_TRACT

## 2017-10-15 DIAGNOSIS — R140 Abdominal distension (gaseous): Secondary | ICD-10-CM | POA: Diagnosis not present

## 2017-10-15 DIAGNOSIS — R197 Diarrhea, unspecified: Secondary | ICD-10-CM | POA: Diagnosis not present

## 2017-10-15 DIAGNOSIS — K582 Mixed irritable bowel syndrome: Secondary | ICD-10-CM | POA: Diagnosis not present

## 2017-10-15 DIAGNOSIS — R1033 Periumbilical pain: Secondary | ICD-10-CM | POA: Diagnosis not present

## 2017-10-28 ENCOUNTER — Other Ambulatory Visit: Payer: Self-pay | Admitting: Rheumatology

## 2017-10-28 NOTE — Telephone Encounter (Signed)
Last Visit: 09/24/17 Next visit: 12/25/17 Labs: 10/02/17 elevated glucose  Low potassium  PLQ Eye Exam: 07/23/17 WNL   Okay to refill per Dr. Deveshwar 

## 2017-11-04 ENCOUNTER — Other Ambulatory Visit: Payer: Self-pay

## 2017-11-04 DIAGNOSIS — Z79899 Other long term (current) drug therapy: Secondary | ICD-10-CM

## 2017-11-05 ENCOUNTER — Telehealth: Payer: Self-pay | Admitting: *Deleted

## 2017-11-05 DIAGNOSIS — R0602 Shortness of breath: Secondary | ICD-10-CM

## 2017-11-05 DIAGNOSIS — M359 Systemic involvement of connective tissue, unspecified: Secondary | ICD-10-CM

## 2017-11-05 LAB — CBC WITH DIFFERENTIAL/PLATELET
Basophils Absolute: 70 cells/uL (ref 0–200)
Basophils Relative: 1.3 %
Eosinophils Absolute: 529 cells/uL — ABNORMAL HIGH (ref 15–500)
Eosinophils Relative: 9.8 %
HCT: 37.8 % (ref 35.0–45.0)
Hemoglobin: 12.4 g/dL (ref 11.7–15.5)
Lymphs Abs: 1825 cells/uL (ref 850–3900)
MCH: 27.7 pg (ref 27.0–33.0)
MCHC: 32.8 g/dL (ref 32.0–36.0)
MCV: 84.6 fL (ref 80.0–100.0)
MPV: 10.6 fL (ref 7.5–12.5)
Monocytes Relative: 8.9 %
Neutro Abs: 2495 cells/uL (ref 1500–7800)
Neutrophils Relative %: 46.2 %
Platelets: 265 10*3/uL (ref 140–400)
RBC: 4.47 10*6/uL (ref 3.80–5.10)
RDW: 13.1 % (ref 11.0–15.0)
Total Lymphocyte: 33.8 %
WBC mixed population: 481 cells/uL (ref 200–950)
WBC: 5.4 10*3/uL (ref 3.8–10.8)

## 2017-11-05 LAB — COMPLETE METABOLIC PANEL WITH GFR
AG Ratio: 1.6 (calc) (ref 1.0–2.5)
ALT: 6 U/L (ref 6–29)
AST: 13 U/L (ref 10–30)
Albumin: 4 g/dL (ref 3.6–5.1)
Alkaline phosphatase (APISO): 52 U/L (ref 33–115)
BUN/Creatinine Ratio: 8 (calc) (ref 6–22)
BUN: 6 mg/dL — ABNORMAL LOW (ref 7–25)
CO2: 29 mmol/L (ref 20–32)
Calcium: 9 mg/dL (ref 8.6–10.2)
Chloride: 104 mmol/L (ref 98–110)
Creat: 0.78 mg/dL (ref 0.50–1.10)
GFR, Est African American: 112 mL/min/{1.73_m2} (ref >=60)
GFR, Est Non African American: 96 mL/min/{1.73_m2} (ref >=60)
Globulin: 2.5 g/dL (calc) (ref 1.9–3.7)
Glucose, Bld: 84 mg/dL (ref 65–99)
Potassium: 3.7 mmol/L (ref 3.5–5.3)
Sodium: 140 mmol/L (ref 135–146)
Total Bilirubin: 0.4 mg/dL (ref 0.2–1.2)
Total Protein: 6.5 g/dL (ref 6.1–8.1)

## 2017-11-05 NOTE — Telephone Encounter (Signed)
EMily: ok to use research slot provided you check with Anderson Malta and choose which one  THanks    SIGNATURE    Dr. Brand Males, M.D., F.C.C.P,  Pulmonary and Critical Care Medicine Staff Physician, [place] Director - Interstitial Lung Disease  Program  Pulmonary [place] at [place], [place], [address]  Pager: [phone], If no answer or between  15:00h - 7:00h: call [phone] Telephone: [phone]  11:02 AM 11/05/2017

## 2017-11-05 NOTE — Telephone Encounter (Signed)
Checked with Anderson Malta from [employer] to see if we could use either held research slot on 10/30, 11/1, or 11/4. Per Anderson Malta, any of those days would be okay for [place] to use for pt's consult appt.  Called and spoke with pt in regards to Dr. Estanislado Pandy wanting pt to be seen by MR. Pt was aware of this as Dr. Estanislado Pandy had already discussed it with pt.  I have scheduled pt a consult appt with MR Monday, 11/4 at 10:15. Nothing further needed.

## 2017-11-05 NOTE — Telephone Encounter (Signed)
First avail day for a 30 min appt in regular clinic is not until 11/26 unless we use the held 75min appt slot for the research visit (then we could be able to get pt worked in a lot sooner).   MR, please advise if this is okay or if we need to get pt in sooner for an appt.

## 2017-11-05 NOTE — Telephone Encounter (Signed)
Emma Franco : please work patient into my general clinic for 30 min appointment  Thanks

## 2017-11-05 NOTE — Telephone Encounter (Signed)
Contacted patient with lab results. Patient states she has been experiencing SOB. Patient states the SOB when talking to long or going up steps or walking. Patient has seen her PCP who has done some testing that does not show a cause of the SOB. Chest X-ray on 10/02/17 No edema or consolidation.  No evident adenopathy. Chest CT Angio. No demonstrable pulmonary embolus. No thoracic aortic aneurysm or dissection.  2.  Slight right upper lobe atelectasis.  No edema or consolidation.  3.  No evident adenopathy.  Patient is asking if this could be related to her Lupus.   Please advise

## 2017-11-05 NOTE — Telephone Encounter (Signed)
I contacted Dr. Chase Caller . He will work her in. Please, send a referral and notify patient.

## 2017-11-05 NOTE — Telephone Encounter (Signed)
Patient advised Dr. Estanislado Pandy would like for her to see a pulmonologist. Patient advised Dr. Golden Pop office will contact her to schedule the appointment.

## 2017-11-11 ENCOUNTER — Encounter: Payer: Self-pay | Admitting: Internal Medicine

## 2017-11-11 ENCOUNTER — Ambulatory Visit: Payer: 59 | Admitting: Internal Medicine

## 2017-11-11 ENCOUNTER — Other Ambulatory Visit (INDEPENDENT_AMBULATORY_CARE_PROVIDER_SITE_OTHER): Payer: 59

## 2017-11-11 VITALS — BP 122/74 | HR 79 | Ht 65.0 in | Wt 158.6 lb

## 2017-11-11 DIAGNOSIS — J45909 Unspecified asthma, uncomplicated: Secondary | ICD-10-CM | POA: Diagnosis not present

## 2017-11-11 DIAGNOSIS — Z23 Encounter for immunization: Secondary | ICD-10-CM | POA: Diagnosis not present

## 2017-11-11 DIAGNOSIS — R05 Cough: Secondary | ICD-10-CM

## 2017-11-11 DIAGNOSIS — R053 Chronic cough: Secondary | ICD-10-CM

## 2017-11-11 DIAGNOSIS — R0602 Shortness of breath: Secondary | ICD-10-CM | POA: Diagnosis not present

## 2017-11-11 LAB — CBC WITH DIFFERENTIAL/PLATELET
BASOS ABS: 0.1 10*3/uL (ref 0.0–0.1)
Basophils Relative: 0.8 % (ref 0.0–3.0)
EOS PCT: 4.1 % (ref 0.0–5.0)
Eosinophils Absolute: 0.3 10*3/uL (ref 0.0–0.7)
HEMATOCRIT: 39.1 % (ref 36.0–46.0)
HEMOGLOBIN: 13.1 g/dL (ref 12.0–15.0)
LYMPHS PCT: 18.8 % (ref 12.0–46.0)
Lymphs Abs: 1.6 10*3/uL (ref 0.7–4.0)
MCHC: 33.6 g/dL (ref 30.0–36.0)
MCV: 83 fl (ref 78.0–100.0)
Monocytes Absolute: 0.6 10*3/uL (ref 0.1–1.0)
Monocytes Relative: 6.7 % (ref 3.0–12.0)
Neutro Abs: 5.9 10*3/uL (ref 1.4–7.7)
Neutrophils Relative %: 69.6 % (ref 43.0–77.0)
Platelets: 288 10*3/uL (ref 150.0–400.0)
RBC: 4.71 Mil/uL (ref 3.87–5.11)
RDW: 13.9 % (ref 11.5–15.5)
WBC: 8.4 10*3/uL (ref 4.0–10.5)

## 2017-11-11 LAB — NITRIC OXIDE: NITRIC OXIDE: 45

## 2017-11-11 MED ORDER — ALBUTEROL SULFATE HFA 108 (90 BASE) MCG/ACT IN AERS: 2.0000 | INHALATION_SPRAY | Freq: Four times a day (QID) | RESPIRATORY_TRACT | 2 refills | Status: DC | PRN

## 2017-11-11 MED ORDER — FLUTICASONE FUROATE-VILANTEROL 100-25 MCG/INH IN AEPB: 1.0000 | INHALATION_SPRAY | Freq: Every day | RESPIRATORY_TRACT | 5 refills | Status: DC

## 2017-11-11 MED ORDER — FLUTICASONE FUROATE-VILANTEROL 100-25 MCG/INH IN AEPB: 1.0000 | INHALATION_SPRAY | Freq: Every day | RESPIRATORY_TRACT | 0 refills | Status: AC

## 2017-11-11 NOTE — Patient Instructions (Addendum)
ICD-10-CM   1. Uncomplicated asthma, unspecified asthma severity, unspecified whether persistent J45.909   2. SOB (shortness of breath) R06.02 Nitric oxide  3. Chronic cough R05    Diagnosis is most fitting in with asthma No evidence of lupus affecting your lung at this point  Plan - check cbc with diff, IgE and blood allergy profile = start breo daily low dose - use albuterol as needed - prevnar vaccine 11/11/2017   Followup 6 weeks return to ssee APP  FeNO at followup

## 2017-11-11 NOTE — Progress Notes (Signed)
Subjective:     Patient ID: Emma Franco, female   DOB: Mar 27, 1980, 37 y.o.   MRN: [id number]  PCP Sinda Du, MD   HPI  IOV 11/11/2017  Chief Complaint  Patient presents with  . pulmonary consult    pt reports of sob with activity or talking for a long period of time (57min) & prod cough with pale yellow mucus x80mo worsen over the past month.     37 year old female referred by Dr. Abel Presto deveshwar rheumatologist for evaluation of shortness of breath and cough.  According to the patient she is has a mother with rheumatoid arthritis.  For unspecified period of time in the past she has had some arthralgia and myalgias which was presumed due to her baseline scoliosis but also associated mild hair loss.  Then earlier this year 2019 primary care physician diagnosed with positive ANA.  She was then referred to rheumatology Dr. Estanislado Pandy on May 2019 diagnosed with lupus.  Review of autoimmune profile shows slightly positive double-stranded DNA 48.  But given her symptom hematology the diagnosis of SLE was made.  She was then placed on Plaquenil which is helped her fatigue but not other symptoms.  She reported to her that she has had insidious onset of cough and shortness of breath therefore patient has been referred here  According to the patient she has had cough for several months probably starting spring 2019.  This happens randomly is mild to moderate in intensity.  Does not Messerly be nocturnal.  No paroxysmal cough no associated wheezing and cough associated with mild yellow sputum occasionally.  She is known to have spring allergies.  She has baseline eosinophilia documented below but she is unaware of this  Then starting May 2019 or so she started having symptoms of shortness of breath for exertion such as talking and climbing stairs and other activities relieved by rest.  This progressed significantly better in September 2019 she ended up in the emergency department and had  pulmonary embolism ruled out.  I visualized the CT scan myself and the lung fields look clear than October 2019 she had an echocardiogram that does not report any pulmonary hypertension.  RV function is normal.  No associated edema proximal nocturnal dyspnea or orthopnea.  No fever or chills.  No rash.  Her exam nitric oxide today is elevated at 45 ppb.  No family history of asthma.    Results for HAILLEE, JOHANN (MRN [id number]) as of 11/11/2017 10:49  Ref. Range 03/19/2008 10:15 12/27/2009 11:53 06/26/2017 15:08  Sed Rate Latest Ref Range: 0 - 20 mm/h 9 8 2    Results for VERETTA, SABOURIN (MRN [id number]) as of 11/11/2017 10:49  Ref. Range 03/19/2008 10:15 12/27/2009 11:53 04/17/2011 16:16 05/21/2017 09:07 08/01/2017 13:26 10/02/2017 08:16 11/04/2017 13:31  Eosinophils Absolute Latest Ref Range: 15 - 500 cells/uL  0.4 0.7 446 520 (H) 0.3 529 (H)  Results for SHAMYAH, STANTZ (MRN [id number]) as of 11/11/2017 10:49  Ref. Range 03/19/2008 10:15 12/27/2009 11:53 04/17/2011 16:16 05/21/2017 09:07 08/01/2017 13:26 10/02/2017 08:16 11/04/2017 13:31  Eosinophil Latest Units: % 7.0 (H) 6.3 (H) 10.6 (H) 7.2 8.0 6 9.8     has a past medical history of Arthritis, Environmental allergies, Esophageal reflux, Headache(784.0) (07/18/11), Hiatal hernia, Irritable bowel syndrome, Lupus ([place]), Migraines, PONV (postoperative nausea and vomiting), Postpartum care following cesarean delivery (10/9) (10/16/2013), Scoliosis, and Sinusitis, chronic.   reports that she has never smoked. She has never used smokeless tobacco.  Past Surgical History:  Procedure Laterality Date  . CESAREAN SECTION  03/2009  . CESAREAN SECTION Bilateral 10/16/2013   Procedure: REPEAT CESAREAN SECTION WITH BILATERAL TUBAL LIGATION ;  Surgeon: Claiborne Billings A. Pamala Hurry, MD;  Location: [facility];  Service: Obstetrics;  Laterality: Bilateral;   EDD: 10/26/13  . CHOLECYSTECTOMY  12/1996  . NASAL SEPTUM SURGERY  2006  . NASAL SINUS SURGERY  03/2010; 07/18/11   . REPAIR DURAL / CSF LEAK  07/18/11  . SINUS ENDO W/FUSION  07/18/2011   Procedure: ENDOSCOPIC SINUS SURGERY WITH FUSION NAVIGATION;  Surgeon: Ruby Cola, MD;  Location: [facility];  Service: ENT;  Laterality: N/A;  . TONSILLECTOMY  1990    Allergies  Allergen Reactions  . Cefdinir Swelling  . Ciprofloxacin Nausea And Vomiting    Immunization History  Administered Date(s) Administered  . Influenza-Unspecified 09/30/2017  . Meningococcal Polysaccharide 07/19/2011  . Pneumococcal Polysaccharide-23 07/19/2011    Family History  Problem Relation Age of Onset  . Stroke Maternal Grandmother   . Stroke Paternal Grandmother   . Rheum arthritis Mother   . Multiple sclerosis Mother      Current Outpatient Medications:  .  buPROPion (WELLBUTRIN XL) 150 MG 24 hr tablet, Take 150 mg by mouth daily. , Disp: , Rfl:  .  cetirizine (ZYRTEC) 10 MG tablet, Take 10 mg by mouth daily., Disp: , Rfl:  .  fluticasone (FLONASE) 50 MCG/ACT nasal spray, Place 1 spray into the nose daily. , Disp: , Rfl:  .  hydroxychloroquine (PLAQUENIL) 200 MG tablet, TAKE 1 TABLET TWICE DAILY MONDAY THROUGH FRIDAY., Disp: 40 tablet, Rfl: 0 .  sertraline (ZOLOFT) 50 MG tablet, Take 1 tablet by mouth daily., Disp: , Rfl:  .  VASCEPA 1 g CAPS, Take 2 capsules by mouth 2 (two) times daily., Disp: , Rfl:   Current Facility-Administered Medications:  .  chlorpheniramine-HYDROcodone (TUSSIONEX) 10-8 MG/5ML suspension 5 mL, 5 mL, Oral, Q12H PRN, Kennyth Arnold, FNP   Review of Systems     Objective:   Physical Exam  Constitutional: She is oriented to person, place, and time. She appears well-developed and well-nourished. No distress.  HENT:  Head: Normocephalic and atraumatic.  Right Ear: External ear normal.  Left Ear: External ear normal.  Mouth/Throat: Oropharynx is clear and moist. No oropharyngeal exudate.  Eyes: Pupils are equal, round, and reactive to light. Conjunctivae and EOM are normal. Right eye exhibits  no discharge. Left eye exhibits no discharge. No scleral icterus.  Neck: Normal range of motion. Neck supple. No JVD present. No tracheal deviation present. No thyromegaly present.  Cardiovascular: Normal rate, regular rhythm, normal heart sounds and intact distal pulses. Exam reveals no gallop and no friction rub.  No murmur heard. Pulmonary/Chest: Effort normal and breath sounds normal. No respiratory distress. She has no wheezes. She has no rales. She exhibits no tenderness.  Abdominal: Soft. Bowel sounds are normal. She exhibits no distension and no mass. There is no tenderness. There is no rebound and no guarding.  Musculoskeletal: Normal range of motion. She exhibits no edema or tenderness.  Lymphadenopathy:    She has no cervical adenopathy.  Neurological: She is alert and oriented to person, place, and time. She has normal reflexes. No cranial nerve deficit. She exhibits normal muscle tone. Coordination normal.  Skin: Skin is warm and dry. No rash noted. She is not diaphoretic. No erythema. No pallor.  Psychiatric: She has a normal mood and affect. Her behavior is normal. Judgment and thought content normal.  Vitals reviewed.  Vitals:   11/11/17 1021  BP: 122/74  Pulse: 79  SpO2: 98%  Weight: 158 lb 9.6 oz (71.9 kg)  Height: 5\' 5"  (1.651 m)    Estimated body mass index is 26.39 kg/m as calculated from the following:   Height as of this encounter: 5\' 5"  (1.651 m).   Weight as of this encounter: 158 lb 9.6 oz (71.9 kg).     Assessment:       ICD-10-CM   1. Uncomplicated asthma, unspecified asthma severity, unspecified whether persistent J45.909 IgE    CBC w/Diff    Resp Allergy Profile Regn2DC DE MD [facility]  2. SOB (shortness of breath) R06.02 Nitric oxide  3. Chronic cough R05        Plan:     Patient Instructions     ICD-10-CM   1. Uncomplicated asthma, unspecified asthma severity, unspecified whether persistent J45.909   2. SOB (shortness of breath) R06.02 Nitric  oxide  3. Chronic cough R05    Diagnosis is most fitting in with asthma No evidence of lupus affecting your lung at this point  Plan - check cbc with diff, IgE and blood allergy profile = start breo daily low dose - use albuterol as needed - prevnar vaccine 11/11/2017   Followup 6 weeks return to ssee APP  FeNO at followup      SIGNATURE    Dr. Brand Males, M.D., F.C.C.P,  Pulmonary and Critical Care Medicine Staff Physician, [place] Director - Interstitial Lung Disease  Program  Pulmonary [place] at [place], [place], [address]  Pager: [phone], If no answer or between  15:00h - 7:00h: call [phone] Telephone: [phone]  11:19 AM 11/11/2017

## 2017-11-11 NOTE — Addendum Note (Signed)
Addended by: Karmen Stabs on: 11/11/2017 11:30 AM   Modules accepted: Orders

## 2017-11-11 NOTE — Progress Notes (Signed)
   Subjective:    Patient ID: Emma Franco, female    DOB: December 25, 1980, 37 y.o.   MRN: [id number]  HPI    Review of Systems  Constitutional: Negative for fever and unexpected weight change.  HENT: Positive for sneezing. Negative for congestion, dental problem, ear pain, nosebleeds, postnasal drip, rhinorrhea, sinus pressure, sore throat and trouble swallowing.   Eyes: Negative for redness and itching.  Respiratory: Positive for cough and shortness of breath. Negative for chest tightness and wheezing.   Cardiovascular: Negative for palpitations and leg swelling.  Gastrointestinal: Negative for nausea and vomiting.  Genitourinary: Negative for dysuria.  Musculoskeletal: Negative for joint swelling.  Skin: Negative for rash.  Neurological: Negative for headaches.  Hematological: Does not bruise/bleed easily.  Psychiatric/Behavioral: Negative for dysphoric mood. The patient is nervous/anxious.        Objective:   Physical Exam        Assessment & Plan:

## 2017-11-12 LAB — RESPIRATORY ALLERGY PROFILE REGION II ~~LOC~~
Allergen, Comm Silver Birch, t9: <0.1 kU/L
Allergen, Cottonwood, t14: <0.1 kU/L
Allergen, D pternoyssinus,d7: <0.1 kU/L
Allergen, Mouse Urine Protein, e78: <0.1 kU/L
Allergen, Oak,t7: <0.1 kU/L
Allergen, P. notatum, m1: <0.1 kU/L
Bermuda Grass: <0.1 kU/L
Box Elder IgE: <0.1 kU/L
CLADOSPORIUM HERBARUM (M2) IGE: <0.1 kU/L
CLASS: 0
CLASS: 0
CLASS: 0
CLASS: 0
CLASS: 0
CLASS: 0
CLASS: 0
CLASS: 0
CLASS: 0
CLASS: 0
CLASS: 0
CLASS: 0
COMMON RAGWEED (SHORT) (W1) IGE: <0.1 kU/L
Class: 0
Class: 0
Class: 0
Class: 0
Class: 0
Class: 0
Class: 0
Class: 0
Class: 0
Class: 0
Class: 0
Class: 0
D. farinae: <0.1 kU/L
Dog Dander: <0.1 kU/L
Elm IgE: <0.1 kU/L
IgE (Immunoglobulin E), Serum: 3 kU/L (ref <=114)
Johnson Grass: <0.1 kU/L
Pecan/Hickory Tree IgE: <0.1 kU/L
Timothy Grass: <0.1 kU/L

## 2017-11-12 LAB — INTERPRETATION:

## 2017-11-14 ENCOUNTER — Telehealth: Payer: Self-pay | Admitting: Nurse Practitioner

## 2017-11-14 NOTE — Telephone Encounter (Signed)
Pt is aware of results and voiced her understanding.  Noting further is needed.   Brand Males, MD  Madolyn Frieze, LPN        Blood allergy profile negative

## 2017-11-21 DIAGNOSIS — K5900 Constipation, unspecified: Secondary | ICD-10-CM | POA: Diagnosis not present

## 2017-11-21 DIAGNOSIS — R140 Abdominal distension (gaseous): Secondary | ICD-10-CM | POA: Diagnosis not present

## 2017-11-28 ENCOUNTER — Other Ambulatory Visit: Payer: Self-pay | Admitting: Rheumatology

## 2017-11-28 DIAGNOSIS — J45909 Unspecified asthma, uncomplicated: Secondary | ICD-10-CM | POA: Diagnosis not present

## 2017-11-28 DIAGNOSIS — J301 Allergic rhinitis due to pollen: Secondary | ICD-10-CM | POA: Diagnosis not present

## 2017-11-28 DIAGNOSIS — M329 Systemic lupus erythematosus, unspecified: Secondary | ICD-10-CM | POA: Diagnosis not present

## 2017-11-28 NOTE — Telephone Encounter (Signed)
Last Visit: 09/24/17 Next visit: 12/25/17 Labs: 10/02/17 elevated glucose  Low potassium  PLQ Eye Exam: 07/23/17 WNL   Okay to refill per Dr. Estanislado Pandy

## 2017-12-03 ENCOUNTER — Ambulatory Visit (HOSPITAL_COMMUNITY): Payer: 59 | Attending: Pulmonary Disease

## 2017-12-03 DIAGNOSIS — R0602 Shortness of breath: Secondary | ICD-10-CM

## 2017-12-11 NOTE — Progress Notes (Signed)
Office Visit Note  Patient: Emma Franco             Date of Birth: 1980/08/11           MRN: [id number]             PCP: Sinda Du, MD Referring: Sinda Du, MD Visit Date: 12/25/2017 Occupation: @GUAROCC @  Subjective:  Fatigue    History of Present Illness: Marquasha Brutus is a 37 y.o. female  with history of autoimmune disease. She is on plaquenil 200 mg 1 tablet BID M-F.  She reports she continues to have myalgias, neck, and lower back pain. She denies any joint swelling. She has chronic fatigue.  She denies any recent rashes or Raynaud's.  She continues to have hair loss.  She has eye dryness but no mouth dryness.  She denies any sores in mouth or nose.  She states her hands have been shaking intermittently but this is long term concern.   She states she was evaluated by Dr. Collene Mares and was started on a probiotic which has improved her GI symptoms significantly.  She was also evaluated by Dr. Chase Caller, and she reports she was diagnosed with mild asthma and was started on a daily inhaler and a emergency inhaler.     Activities of Daily Living:  Patient reports morning stiffness for 20 minutes.   Patient Denies nocturnal pain.  Difficulty dressing/grooming: Denies Difficulty climbing stairs: Denies Difficulty getting out of chair: Reports Difficulty using hands for taps, buttons, cutlery, and/or writing: Denies  Review of Systems  Constitutional: Positive for fatigue.  HENT: Negative for mouth sores, mouth dryness and nose dryness.   Eyes: Positive for dryness. Negative for pain and visual disturbance.  Respiratory: Negative for cough, hemoptysis, shortness of breath and difficulty breathing.   Cardiovascular: Negative for chest pain, palpitations, hypertension and swelling in legs/feet.  Gastrointestinal: Negative for blood in stool, constipation and diarrhea.  Endocrine: Negative for increased urination.  Genitourinary: Negative for painful urination.    Musculoskeletal: Positive for arthralgias, joint pain, myalgias and myalgias. Negative for joint swelling, muscle weakness, morning stiffness and muscle tenderness.  Skin: Positive for hair loss. Negative for color change, pallor, rash, nodules/bumps, skin tightness, ulcers and sensitivity to sunlight.  Allergic/Immunologic: Negative for susceptible to infections.  Neurological: Negative for dizziness, numbness, headaches and weakness.  Hematological: Negative for swollen glands.  Psychiatric/Behavioral: Positive for depressed mood and sleep disturbance. The patient is nervous/anxious.     PMFS History:  Patient Active Problem List   Diagnosis Date Noted  . SOB (shortness of breath) 12/23/2017  . Autoimmune disease ([place]) 06/26/2017  . History of miscarriage 06/26/2017  . Chronic sinusitis 05/21/2017  . History of anxiety 05/21/2017  . Scoliosis 05/21/2017  . Family history of rheumatoid arthritis 05/21/2017  . Previous cesarean delivery, delivered 10/19/2013  . Acute blood loss anemia 10/17/2013  . Postpartum care following cesarean delivery (10/9) 10/16/2013  . Gastroenteritis 09/11/2013  . ESOPHAGITIS 03/18/2008  . HIATAL HERNIA 03/18/2008  . GERD 02/16/2008  . IRRITABLE BOWEL SYNDROME 02/06/2008  . ARTHRITIS 02/06/2008  . ABDOMINAL PAIN-EPIGASTRIC 02/06/2008    Past Medical History:  Diagnosis Date  . 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 Environmental allergies   . Esophageal reflux   . Headache(784.0) 07/18/11   "qd for the last year; CSF leak; repaired today"  . Hiatal hernia   . Irritable bowel syndrome   . Lupus ([place])   . Migraines    "  once in a blue moon"  . PONV (postoperative nausea and vomiting)   . Postpartum care following cesarean delivery (10/9) 10/16/2013  . Scoliosis    mild  . Sinusitis, chronic     Family History  Problem Relation Age of Onset  . Stroke Maternal Grandmother   . Stroke Paternal Grandmother   . Rheum arthritis Mother   . Multiple  sclerosis Mother    Past Surgical History:  Procedure Laterality Date  . CESAREAN SECTION  03/2009  . CESAREAN SECTION Bilateral 10/16/2013   Procedure: REPEAT CESAREAN SECTION WITH BILATERAL TUBAL LIGATION ;  Surgeon: Claiborne Billings A. Pamala Hurry, MD;  Location: [facility];  Service: Obstetrics;  Laterality: Bilateral;   EDD: 10/26/13  . CHOLECYSTECTOMY  12/1996  . NASAL SEPTUM SURGERY  2006  . NASAL SINUS SURGERY  03/2010; 07/18/11  . REPAIR DURAL / CSF LEAK  07/18/11  . SINUS ENDO W/FUSION  07/18/2011   Procedure: ENDOSCOPIC SINUS SURGERY WITH FUSION NAVIGATION;  Surgeon: Ruby Cola, MD;  Location: [facility];  Service: ENT;  Laterality: N/A;  . TONSILLECTOMY  1990   Social History   Social History Narrative  . Not on file   Immunization History  Administered Date(s) Administered  . Influenza-Unspecified 09/30/2017  . Meningococcal Polysaccharide 07/19/2011  . Pneumococcal Conjugate-13 11/11/2017  . Pneumococcal Polysaccharide-23 07/19/2011    Objective: Vital Signs: BP 128/89 (BP Location: Left Arm, Patient Position: Sitting, Cuff Size: Normal)   Pulse 93   Resp 12   Ht 5\' 5"  (1.651 m)   Wt 161 lb 6.4 oz (73.2 kg)   BMI 26.86 kg/m    Physical Exam Vitals signs and nursing note reviewed.  Constitutional:      Appearance: She is well-developed.  HENT:     Head: Normocephalic and atraumatic.     Comments: No parotid swelling    Mouth/Throat:     Comments: No oral or nasal ulcerations Eyes:     Conjunctiva/sclera: Conjunctivae normal.  Neck:     Musculoskeletal: Normal range of motion.  Cardiovascular:     Rate and Rhythm: Normal rate and regular rhythm.     Heart sounds: Normal heart sounds.  Pulmonary:     Effort: Pulmonary effort is normal.     Breath sounds: Normal breath sounds.  Abdominal:     General: Bowel sounds are normal.     Palpations: Abdomen is soft.  Lymphadenopathy:     Cervical: No cervical adenopathy.  Skin:    General: Skin is warm and dry.     Capillary  Refill: Capillary refill takes less than 2 seconds.     Comments: Hair thinning noted. No malar rash or discoid lesion.  No digital ulcerations or signs of gangrene.  Neurological:     Mental Status: She is alert and oriented to person, place, and time.  Psychiatric:        Behavior: Behavior normal.      Musculoskeletal Exam: C-spine, thoracic spine, and lumbar spine good ROM.  Shoulder joints, elbow joints, wrist joints, MCPs, PIPs, and DIPs good ROM with no synovitis.  Complete fist formation. Hip joints, knee joints, ankle joints, MTPs, PIPs, and DIPs good ROM with no synovitis.  No warmth or effusion of knee joints.  No tenderness or swelling of ankle joints.  No tenderness of trochanteric bursa bilaterally.   CDAI Exam: CDAI Score: Not documented Patient Global Assessment: Not documented; Provider Global Assessment: Not documented Swollen: Not documented; Tender: Not documented Joint Exam   Not documented  There is currently no information documented on the homunculus. Go to the Rheumatology activity and complete the homunculus joint exam.  Investigation: No additional findings.  Imaging: No results found.  Recent Labs: Lab Results  Component Value Date   WBC 8.4 11/11/2017   HGB 13.1 11/11/2017   PLT 288.0 11/11/2017   NA 140 11/04/2017   K 3.7 11/04/2017   CL 104 11/04/2017   CO2 29 11/04/2017   GLUCOSE 84 11/04/2017   BUN 6 (L) 11/04/2017   CREATININE 0.78 11/04/2017   BILITOT 0.4 11/04/2017   ALKPHOS 131 (H) 09/11/2013   AST 13 11/04/2017   ALT 6 11/04/2017   PROT 6.5 11/04/2017   ALBUMIN 2.7 (L) 09/11/2013   CALCIUM 9.0 11/04/2017   GFRAA 112 11/04/2017    Speciality Comments: PLQ Eye Exam: 07/23/17 WNL with Susanne Greenhouse OD PA  Procedures:  No procedures performed Allergies: Cefdinir and Ciprofloxacin     Assessment / Plan:     Visit Diagnoses: Autoimmune disease ([place]) - ANA positive, dsDNA 48, rest of the ENA negative, C3-C4 normal.  History  of fatigue, hair loss, arthralgias, oral ulcers, sicca symptoms and photosensitivity: She is not having any signs or symptoms of a flare at this time.  She is clinically doing well on Plaquenil 200 mg 1 tablet by mouth twice daily Monday through Friday.  She experiences hair thinning and we discussed treatment options today.  No discoid lesions or malar rash noted.  She has not been experiencing any symptoms of Raynaud's and no digital ulcerations or signs of gangrene were noted.  She experiences eye dryness but no mouth dryness.  No oral or nasal ulcerations were noted on exam.  No cervical lymphadenopathy was palpated.  She is not been experiencing shortness of breath or palpitations recently.  No pericardial or pleural rub auscultated.  She will continue on the current treatment regimen.  A refill of Plaquenil was sent to the pharmacy today.  We will check autoimmune labs today.  She was advised to notify us if develops any new or worsening symptoms.  She will follow-up in the office in 5 months.- Plan: COMPLETE METABOLIC PANEL WITH GFR, CBC with Differential/Platelet, Urinalysis, Routine w reflex microscopic, Anti-DNA antibody, double-stranded, C3 and C4, Sedimentation rate, VITAMIN D 25 Hydroxy (Vit-D Deficiency, Fractures), TSH  High risk medication use - PLQ 200 mg p.o. twice daily Monday through Friday.  CBC and CMP were drawn today to monitor for drug toxicity.  She requires CBC and CMP every 5 months to monitor for drug toxicity.  Aspect eye exam was normal on 07/23/2017.  Standing orders are in place.  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   Other fatigue -She has chronic fatigue.  We will check TSH and vitamin D level today. Plan: VITAMIN D 25 Hydroxy (Vit-D Deficiency, Fractures), TSH  Other form of scoliosis of lumbar spine: She has chronic lower back pain.  She experiences  myalgias.  History of IBS: She has been evaluated by Dr. Collene Mares and was started on probiotics which helped improve her symptoms considerably.  Other medical conditions are listed as follows:  Hiatal hernia  History of anxiety  History of gastroesophageal reflux (GERD)   Orders: Orders Placed This Encounter  Procedures  . COMPLETE METABOLIC PANEL WITH GFR  . CBC with Differential/Platelet  . Urinalysis, Routine w reflex microscopic  . Anti-DNA antibody, double-stranded  . C3  and C4  . Sedimentation rate  . VITAMIN D 25 Hydroxy (Vit-D Deficiency, Fractures)  . TSH   Meds ordered this encounter  Medications  . hydroxychloroquine (PLAQUENIL) 200 MG tablet    Sig: Take 1 tablet by mouth twice daily Monday through Friday only.    Dispense:  120 tablet    Refill:  0    Face-to-face time spent with patient was 30 minutes. Greater than 50% of time was spent in counseling and coordination of care.  Follow-Up Instructions: Return for Autoimmune Disease.   Ofilia Neas, PA-C   I examined and evaluated the patient with Hazel Sams PA.  Patient has no synovitis on my examination although she continues to have some joint pain.  We will continue Plaquenil at this point.  The plan of care was discussed as noted above.  Bo Merino, MD  Note - This record has been created using Editor, commissioning.  Chart creation errors have been sought, but may not always  have been located. Such creation errors do not reflect on  the standard of medical care.

## 2017-12-23 ENCOUNTER — Encounter: Payer: Self-pay | Admitting: Nurse Practitioner

## 2017-12-23 ENCOUNTER — Ambulatory Visit: Payer: 59 | Admitting: Nurse Practitioner

## 2017-12-23 VITALS — BP 120/64 | HR 80 | Ht 65.0 in | Wt 158.6 lb

## 2017-12-23 DIAGNOSIS — R0602 Shortness of breath: Secondary | ICD-10-CM | POA: Diagnosis not present

## 2017-12-23 LAB — NITRIC OXIDE: Nitric Oxide: 25

## 2017-12-23 LAB — POCT EXHALED NITRIC OXIDE: FENO LEVEL (PPB): 25

## 2017-12-23 NOTE — Progress Notes (Signed)
@Patient  ID: Emma Franco, female    DOB: [birth year], 37 y.o.   MRN: [id number]  Chief Complaint  Patient presents with  . Follow-up    Referring provider: Sinda Du, MD  HPI 37 year old female never smoker with shortness of breath and cough followed by Dr. Chase Caller.  PMH: Recent diagnoses of Lupus.   Tests: CTA 10/02/17 - No demonstrable pulmonary embolus. No thoracic aortic aneurysm or dissection. Slight right upper lobe atelectasis.  No edema or consolidation.  CXR 10/02/17 - No edema or consolidation.  No evident adenopathy.  PFT: PFT Results Latest Ref Rng & Units 10/09/2017  FVC-Pre L 4.01  FVC-Predicted Pre % 101  FVC-Post L 4.10  FVC-Predicted Post % 103  Pre FEV1/FVC % % 76  Post FEV1/FCV % % 77  FEV1-Pre L 3.05  FEV1-Predicted Pre % 94  FEV1-Post L 3.18  DLCO UNC% % 93  DLCO COR %Predicted % 96  TLC L 6.29  TLC % Predicted % 117  RV % Predicted % 133   Lab Results  Component Value Date   NITRICOXIDE 25 12/23/2017     OV 12/23/17 - follow up Patient presents today for a follow up. She was last seen by Dr. Chase Caller 6 weeks ago after being referred from rheumatology for shortness of breath and cough. She was recently diagnosed with Lupus. She was started on Breo. She also had labs checked - CBC, IgE, and blood allergy profile which were overall normal. She has had a complete cardiac workup and cardiopulmonary exercise test that were all normal. Her FENO at last visit was 45 ppb and today FENO was checked in office and was found to be 25 ppb. She states that overall she is doing much better since starting Breo inhaler. She does still have shortness of breath at times. She uses her rescue inhaler once daily. She denies any chest pain, edema, or congestion.     Allergies  Allergen Reactions  . Cefdinir Swelling  . Ciprofloxacin Nausea And Vomiting    Immunization History  Administered Date(s) Administered  . Influenza-Unspecified 09/30/2017    . Meningococcal Polysaccharide 07/19/2011  . Pneumococcal Conjugate-13 11/11/2017  . Pneumococcal Polysaccharide-23 07/19/2011    Past Medical History:  Diagnosis Date  . 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 Environmental allergies   . Esophageal reflux   . Headache(784.0) 07/18/11   "qd for the last year; CSF leak; repaired today"  . Hiatal hernia   . Irritable bowel syndrome   . Lupus ([place])   . Migraines    "once in a blue moon"  . PONV (postoperative nausea and vomiting)   . Postpartum care following cesarean delivery (10/9) 10/16/2013  . Scoliosis    mild  . Sinusitis, chronic     Tobacco History: Social History   Tobacco Use  Smoking Status Never Smoker  Smokeless Tobacco Never Used   Counseling given: Yes   Outpatient Encounter Medications as of 12/23/2017  Medication Sig  . albuterol (PROVENTIL HFA;VENTOLIN HFA) 108 (90 Base) MCG/ACT inhaler Inhale 2 puffs into the lungs every 6 (six) hours as needed for wheezing or shortness of breath.  Marland Kitchen buPROPion (WELLBUTRIN XL) 150 MG 24 hr tablet Take 150 mg by mouth daily.   . cetirizine (ZYRTEC) 10 MG tablet Take 10 mg by mouth daily.  . fluticasone (FLONASE) 50 MCG/ACT nasal spray Place 1 spray into the nose daily.   . fluticasone furoate-vilanterol (BREO ELLIPTA) 100-25 MCG/INH AEPB Inhale 1  puff into the lungs daily.  . hydroxychloroquine (PLAQUENIL) 200 MG tablet TAKE 1 TABLET TWICE DAILY MONDAY THROUGH FRIDAY.  Marland Kitchen sertraline (ZOLOFT) 100 MG tablet Take 1 tablet by mouth daily.  Marland Kitchen VASCEPA 1 g CAPS Take 2 capsules by mouth 2 (two) times daily.  . [DISCONTINUED] sertraline (ZOLOFT) 50 MG tablet Take 1 tablet by mouth daily.   Facility-Administered Encounter Medications as of 12/23/2017  Medication  . chlorpheniramine-HYDROcodone (TUSSIONEX) 10-8 MG/5ML suspension 5 mL     Review of Systems  Review of Systems  Constitutional: Negative.  Negative for chills and fever.  HENT: Negative.  Negative for congestion.    Respiratory: Positive for cough and shortness of breath. Negative for wheezing.   Cardiovascular: Negative.  Negative for chest pain, palpitations and leg swelling.  Gastrointestinal: Negative.   Allergic/Immunologic: Negative.   Neurological: Negative.   Psychiatric/Behavioral: Negative.        Physical Exam  BP 120/64 (BP Location: Left Arm, Patient Position: Sitting, Cuff Size: Normal)   Pulse 80   Ht 5\' 5"  (1.651 m)   Wt 158 lb 9.6 oz (71.9 kg)   SpO2 97%   BMI 26.39 kg/m   Wt Readings from Last 5 Encounters:  12/23/17 158 lb 9.6 oz (71.9 kg)  11/11/17 158 lb 9.6 oz (71.9 kg)  10/02/17 160 lb (72.6 kg)  09/24/17 161 lb (73 kg)  06/26/17 160 lb (72.6 kg)     Physical Exam Vitals signs and nursing note reviewed.  Constitutional:      General: She is not in acute distress.    Appearance: She is well-developed.  Cardiovascular:     Rate and Rhythm: Normal rate and regular rhythm.  Pulmonary:     Effort: Pulmonary effort is normal. No respiratory distress.     Breath sounds: Normal breath sounds. No wheezing or rhonchi.  Musculoskeletal:        General: No swelling.  Neurological:     Mental Status: She is alert and oriented to person, place, and time.      Lab Results:  CBC    Component Value Date/Time   WBC 8.4 11/11/2017 1144   RBC 4.71 11/11/2017 1144   HGB 13.1 11/11/2017 1144   HCT 39.1 11/11/2017 1144   PLT 288.0 11/11/2017 1144   MCV 83.0 11/11/2017 1144   MCH 27.7 11/04/2017 1331   MCHC 33.6 11/11/2017 1144   RDW 13.9 11/11/2017 1144   LYMPHSABS 1.6 11/11/2017 1144   MONOABS 0.6 11/11/2017 1144   EOSABS 0.3 11/11/2017 1144   BASOSABS 0.1 11/11/2017 1144    BMET    Component Value Date/Time   NA 140 11/04/2017 1331   K 3.7 11/04/2017 1331   CL 104 11/04/2017 1331   CO2 29 11/04/2017 1331   GLUCOSE 84 11/04/2017 1331   BUN 6 (L) 11/04/2017 1331   CREATININE 0.78 11/04/2017 1331   CALCIUM 9.0 11/04/2017 1331   GFRNONAA 96 11/04/2017  1331   GFRAA 112 11/04/2017 1331     Assessment & Plan:   SOB (shortness of breath) Patient is improving with Breo  Patient Instructions  Glad that you are improving Please continue Breo daily Please continue albuterol every 6 hours as needed Stay active Follow up with Dr. Chase Caller in 8 weeks or sooner if needed       Fenton Foy, NP 12/23/2017

## 2017-12-23 NOTE — Patient Instructions (Signed)
Glad that you are improving Please continue Breo daily Please continue albuterol every 6 hours as needed Stay active Follow up with Dr. Chase Caller in 8 weeks or sooner if needed

## 2017-12-23 NOTE — Assessment & Plan Note (Signed)
Patient is improving with Breo  Patient Instructions  Glad that you are improving Please continue Breo daily Please continue albuterol every 6 hours as needed Stay active Follow up with Dr. Chase Caller in 8 weeks or sooner if needed

## 2017-12-25 ENCOUNTER — Encounter: Payer: Self-pay | Admitting: Physician Assistant

## 2017-12-25 ENCOUNTER — Ambulatory Visit: Payer: 59 | Admitting: Rheumatology

## 2017-12-25 VITALS — BP 128/89 | HR 93 | Resp 12 | Ht 65.0 in | Wt 161.4 lb

## 2017-12-25 DIAGNOSIS — R5383 Other fatigue: Secondary | ICD-10-CM

## 2017-12-25 DIAGNOSIS — Z79899 Other long term (current) drug therapy: Secondary | ICD-10-CM

## 2017-12-25 DIAGNOSIS — M359 Systemic involvement of connective tissue, unspecified: Secondary | ICD-10-CM

## 2017-12-25 DIAGNOSIS — K449 Diaphragmatic hernia without obstruction or gangrene: Secondary | ICD-10-CM

## 2017-12-25 DIAGNOSIS — Z8719 Personal history of other diseases of the digestive system: Secondary | ICD-10-CM

## 2017-12-25 DIAGNOSIS — L930 Discoid lupus erythematosus: Secondary | ICD-10-CM

## 2017-12-25 DIAGNOSIS — M4186 Other forms of scoliosis, lumbar region: Secondary | ICD-10-CM

## 2017-12-25 DIAGNOSIS — Z8659 Personal history of other mental and behavioral disorders: Secondary | ICD-10-CM

## 2017-12-25 MED ORDER — HYDROXYCHLOROQUINE SULFATE 200 MG PO TABS: ORAL_TABLET | ORAL | 0 refills | Status: DC

## 2017-12-26 ENCOUNTER — Telehealth: Payer: Self-pay | Admitting: *Deleted

## 2017-12-26 DIAGNOSIS — E559 Vitamin D deficiency, unspecified: Secondary | ICD-10-CM

## 2017-12-26 LAB — CBC WITH DIFFERENTIAL/PLATELET
Absolute Monocytes: 423 cells/uL (ref 200–950)
Basophils Absolute: 59 cells/uL (ref 0–200)
Basophils Relative: 1.3 %
EOS PCT: 8.9 %
Eosinophils Absolute: 401 cells/uL (ref 15–500)
HEMATOCRIT: 38 % (ref 35.0–45.0)
Hemoglobin: 12.6 g/dL (ref 11.7–15.5)
LYMPHS ABS: 1332 {cells}/uL (ref 850–3900)
MCH: 27.6 pg (ref 27.0–33.0)
MCHC: 33.2 g/dL (ref 32.0–36.0)
MCV: 83.2 fL (ref 80.0–100.0)
MPV: 10.9 fL (ref 7.5–12.5)
Monocytes Relative: 9.4 %
Neutro Abs: 2286 cells/uL (ref 1500–7800)
Neutrophils Relative %: 50.8 %
Platelets: 268 10*3/uL (ref 140–400)
RBC: 4.57 10*6/uL (ref 3.80–5.10)
RDW: 13.4 % (ref 11.0–15.0)
Total Lymphocyte: 29.6 %
WBC: 4.5 10*3/uL (ref 3.8–10.8)

## 2017-12-26 LAB — SEDIMENTATION RATE: Sed Rate: 2 mm/h (ref 0–20)

## 2017-12-26 LAB — COMPLETE METABOLIC PANEL WITH GFR
AG RATIO: 1.5 (calc) (ref 1.0–2.5)
ALT: 8 U/L (ref 6–29)
AST: 15 U/L (ref 10–30)
Albumin: 4 g/dL (ref 3.6–5.1)
Alkaline phosphatase (APISO): 50 U/L (ref 33–115)
BILIRUBIN TOTAL: 0.4 mg/dL (ref 0.2–1.2)
BUN: 9 mg/dL (ref 7–25)
CHLORIDE: 104 mmol/L (ref 98–110)
CO2: 30 mmol/L (ref 20–32)
Calcium: 9 mg/dL (ref 8.6–10.2)
Creat: 0.91 mg/dL (ref 0.50–1.10)
GFR, Est African American: 93 mL/min/{1.73_m2} (ref >=60)
GFR, Est Non African American: 80 mL/min/{1.73_m2} (ref >=60)
Globulin: 2.7 g/dL (calc) (ref 1.9–3.7)
Glucose, Bld: 84 mg/dL (ref 65–99)
POTASSIUM: 4 mmol/L (ref 3.5–5.3)
Sodium: 139 mmol/L (ref 135–146)
Total Protein: 6.7 g/dL (ref 6.1–8.1)

## 2017-12-26 LAB — C3 AND C4
C3 Complement: 92 mg/dL (ref 83–193)
C4 Complement: 31 mg/dL (ref 15–57)

## 2017-12-26 LAB — URINALYSIS, ROUTINE W REFLEX MICROSCOPIC
Bilirubin Urine: NEGATIVE
Glucose, UA: NEGATIVE
Hgb urine dipstick: NEGATIVE
Ketones, ur: NEGATIVE
Leukocytes, UA: NEGATIVE
Nitrite: NEGATIVE
Protein, ur: NEGATIVE
Specific Gravity, Urine: 1.01 (ref 1.001–1.03)
pH: 6.5 (ref 5.0–8.0)

## 2017-12-26 LAB — VITAMIN D 25 HYDROXY (VIT D DEFICIENCY, FRACTURES): Vit D, 25-Hydroxy: 25 ng/mL — ABNORMAL LOW (ref 30–100)

## 2017-12-26 LAB — ANTI-DNA ANTIBODY, DOUBLE-STRANDED: ds DNA Ab: 36 IU/mL — ABNORMAL HIGH

## 2017-12-26 LAB — TSH: TSH: 3.47 mIU/L

## 2017-12-26 MED ORDER — VITAMIN D (ERGOCALCIFEROL) 1.25 MG (50000 UNIT) PO CAPS: 50000.0000 [IU] | ORAL_CAPSULE | ORAL | 0 refills | Status: DC

## 2017-12-26 NOTE — Progress Notes (Signed)
CBC and CMP WNL. TSH WNL. Sed rate WNL. UA normal. Complements WNL. Vitamin D is low-25. Please notify patient and send in vitamin D 50,000 units by mouth once weekly for 3 months.  Recheck vitamin D in 3 months.

## 2017-12-26 NOTE — Telephone Encounter (Signed)
-----   Message from Ofilia Neas, PA-C sent at 12/26/2017  8:47 AM EST ----- CBC and CMP WNL. TSH WNL. Sed rate WNL. UA normal. Complements WNL. Vitamin D is low-25. Please notify patient and send in vitamin D 50,000 units by mouth once weekly for 3 months.  Recheck vitamin D in 3 months.

## 2017-12-27 NOTE — Progress Notes (Signed)
DsDNA is elevated but trending down.

## 2018-01-14 DIAGNOSIS — M329 Systemic lupus erythematosus, unspecified: Secondary | ICD-10-CM | POA: Diagnosis not present

## 2018-01-14 DIAGNOSIS — R0602 Shortness of breath: Secondary | ICD-10-CM | POA: Diagnosis not present

## 2018-01-14 DIAGNOSIS — J301 Allergic rhinitis due to pollen: Secondary | ICD-10-CM | POA: Diagnosis not present

## 2018-01-21 DIAGNOSIS — Z79899 Other long term (current) drug therapy: Secondary | ICD-10-CM | POA: Diagnosis not present

## 2018-01-28 DIAGNOSIS — Q763 Congenital scoliosis due to congenital bony malformation: Secondary | ICD-10-CM | POA: Diagnosis not present

## 2018-01-28 DIAGNOSIS — Z6827 Body mass index (BMI) 27.0-27.9, adult: Secondary | ICD-10-CM | POA: Diagnosis not present

## 2018-01-28 DIAGNOSIS — R030 Elevated blood-pressure reading, without diagnosis of hypertension: Secondary | ICD-10-CM | POA: Diagnosis not present

## 2018-02-25 ENCOUNTER — Encounter: Payer: Self-pay | Admitting: Internal Medicine

## 2018-02-25 ENCOUNTER — Ambulatory Visit: Payer: 59 | Admitting: Internal Medicine

## 2018-02-25 VITALS — BP 126/76 | HR 91 | Ht 65.0 in | Wt 163.8 lb

## 2018-02-25 DIAGNOSIS — J45909 Unspecified asthma, uncomplicated: Secondary | ICD-10-CM | POA: Diagnosis not present

## 2018-02-25 DIAGNOSIS — R5382 Chronic fatigue, unspecified: Secondary | ICD-10-CM | POA: Diagnosis not present

## 2018-02-25 DIAGNOSIS — R0602 Shortness of breath: Secondary | ICD-10-CM

## 2018-02-25 NOTE — Patient Instructions (Addendum)
Uncomplicated asthma, unspecified asthma severity, unspecified whether persistent  Glad you are significantly better after taking Breo -Continue Breo scheduled daily -Use albuterol as needed  SOB (shortness of breath) Chronic fatigue  -Glad some of this is better after better control of asthma -Residual shortness of breath could be from pulmonary hypertension related to lupus but October 2019 echocardiogram did not show evidence of pulmonary hypertension or any other heart issue -Therefore, residual shortness of breath and fatigue is due to lupus and physical deconditioning -I strongly recommended pulmonary rehabilitation at [facility] but given your schedule issues I respect the fact you cannot do it at this time -Therefore, we agreed that he would start swimming at the [facility] on a regular basis as soon as possible  Follow-up 6 months or sooner; exam nitric oxide test at follow-up and asthma control questionnaire

## 2018-02-25 NOTE — Progress Notes (Signed)
PCP Sinda Du, MD   HPI  IOV 11/11/2017  Chief Complaint  Patient presents with  . pulmonary consult    pt reports of sob with activity or talking for a long period of time (6min) & prod cough with pale yellow mucus x84mo worsen over the past month.     38 year old female referred by Dr. Abel Presto deveshwar rheumatologist for evaluation of shortness of breath and cough.  According to the patient she is has a mother with rheumatoid arthritis.  For unspecified period of time in the past she has had some arthralgia and myalgias which was presumed due to her baseline scoliosis but also associated mild hair loss.  Then earlier this year 2019 primary care physician diagnosed with positive ANA.  She was then referred to rheumatology Dr. Estanislado Pandy on May 2019 diagnosed with lupus.  Review of autoimmune profile shows slightly positive double-stranded DNA 48.  But given her symptom hematology the diagnosis of SLE was made.  She was then placed on Plaquenil which is helped her fatigue but not other symptoms.  She reported to her that she has had insidious onset of cough and shortness of breath therefore patient has been referred here  According to the patient she has had cough for several months probably starting spring 2019.  This happens randomly is mild to moderate in intensity.  .  No paroxysmal cough no associated wheezing and cough associated with mild yellow sputum occasionally.  She is known to have spring allergies.  She has baseline eosinophilia documented below but she is unaware of this  Then starting May 2019 or so she started having symptoms of shortness of breath for exertion such as talking and climbing stairs and other activities relieved by rest.  This progressed significantly better in September 2019 she ended up in the emergency department and had pulmonary embolism ruled out.  I visualized the CT scan myself and the lung fields look clear than October 2019 she had an echocardiogram  that does not report any pulmonary hypertension.  RV function is normal.  No associated edema proximal nocturnal dyspnea or orthopnea.  No fever or chills.  No rash.  Her exam nitric oxide today is elevated at 45 ppb.  No family history of asthma.    Results for NIKIAH, GOIN (MRN [id number]) as of 11/11/2017 10:49  Ref. Range 03/19/2008 10:15 12/27/2009 11:53 06/26/2017 15:08  Sed Rate Latest Ref Range: 0 - 20 mm/h 9 8 2      OV 02/25/2018  Subjective:  Patient ID: Emma Franco, female , DOB: 1980-03-24 , age 38 y.o. , MRN: [id number] , ADDRESS: [place] [id number]   02/25/2018 -   Chief Complaint  Patient presents with  . Follow-up    Pt states she has been doing okay since last visit. States she still becomes SOB but it is much better than before.   Asthma in  the setting of lupus  HPI Emma Franco 38 y.o. -presents for follow-up of her asthma.  She was diagnosed with asthma based on elevated exam nitric oxide and symptoms.  She was started on Breo in November 2019 and by December 2019 her exhaled nitric oxide improved to normal.  With that her shortness of breath is improved.  Blood allergy panel and IgE was negative at that time.  At this point in time she continues with [facility] and she tells me that she is significantly improved after the Brio.  For example before when she walked to her car she would be short of breath but now that does not exist.  Nevertheless even now she has residual shortness of breath level 1 at rest.  Level 2 doing simple tasks at home.  Level 3 for walking upstairs and level 3 for walking up a hill.  She also has level 2 cough.  But the bigger problem is fatigue which is level 5 which she thinks is because of lupus.  We discussed about going to pulmonary rehabilitation because echocardiogram in October 2019 did not show any evidence of pulmonary hypertension.  However she does not want to do this because of schedule  issues.  She works in [employer] for [employer].  This is on [address] and close to [facility] but she still is not able to do rehab.   Results for Emma, Franco (MRN [id number]) as of 02/25/2018 12:07  Ref. Range 11/11/2017 11:11 pre breo 12/23/2017 14:55 after breo  Nitric Oxide Unknown 45 25    Results for Emma Franco, Emma Franco (MRN [id number]) as of 02/25/2018 12:07  Ref. Range 12/27/2009 11:53 04/17/2011 16:16 05/21/2017 09:07 08/01/2017 13:26 10/02/2017 08:16 11/04/2017 13:31 11/11/2017 11:44 12/25/2017 08:41  Eosinophils Absolute Latest Ref Range: 15 - 500 cells/uL 0.4 0.7 446 520 (H) 0.3 529 (H) 0.3 401   Results for Emma, Franco (MRN [id number]) as of 02/25/2018 12:07  Ref. Range 11/11/2017 11:44  Sheep Sorrel IgE Latest Units: kU/L <0.10  Pecan/Hickory Tree IgE Latest Units: kU/L <0.10  IgE (Immunoglobulin E), Serum Latest Ref Range: <OR=114 kU/L 3  Allergen, D pternoyssinus,d7 Latest Units: kU/L <0.10  Cat Dander Latest Units: kU/L <0.10  Dog Dander Latest Units: kU/L <0.10  [place] Grass Latest Units: kU/L <0.10  Johnson Grass Latest Units: kU/L <0.10  Timothy Grass Latest Units: kU/L <0.10  Cockroach Latest Units: kU/L <0.10  Aspergillus fumigatus, m3 Latest Units: kU/L <0.10  Allergen, Comm Silver Wendee Copp, t9 Latest Units: kU/L <0.10  Allergen, Cottonwood, t14 Latest Units: kU/L <0.10  Elm IgE Latest Units: kU/L <0.10  Allergen, Mulberry, t76 Latest Units: kU/L <0.10  Allergen, Oak,t7 Latest Units: kU/L <0.10  COMMON RAGWEED (SHORT) (W1) IGE Latest Units: kU/L <0.10  Allergen, Mouse Urine Protein, e78 Latest Units: kU/L <0.10  D. farinae Latest Units: kU/L <0.10  Allergen, Cedar tree, t12 Latest Units: kU/L <0.10  Box Elder IgE Latest Units: kU/L <0.10  Rough Pigweed  IgE Latest Units: kU/L <0.10    ROS - per HPI     has a past medical history of Arthritis, Environmental allergies, Esophageal reflux,  Headache(784.0) (07/18/11), Hiatal hernia, Irritable bowel syndrome, Lupus ([place]), Migraines, PONV (postoperative nausea and vomiting), Postpartum care following cesarean delivery (10/9) (10/16/2013), Scoliosis, and Sinusitis, chronic.   reports that she has never smoked. She has never used smokeless tobacco.  Past Surgical History:  Procedure Laterality Date  . CESAREAN SECTION  03/2009  . CESAREAN SECTION Bilateral 10/16/2013   Procedure: REPEAT CESAREAN SECTION WITH BILATERAL TUBAL LIGATION ;  Surgeon: Claiborne Billings A. Pamala Hurry, MD;  Location: [facility];  Service: Obstetrics;  Laterality: Bilateral;   EDD: 10/26/13  . CHOLECYSTECTOMY  12/1996  . NASAL SEPTUM SURGERY  2006  . NASAL SINUS SURGERY  03/2010; 07/18/11  . REPAIR DURAL / CSF LEAK  07/18/11  . SINUS ENDO W/FUSION  07/18/2011   Procedure: ENDOSCOPIC SINUS SURGERY WITH FUSION NAVIGATION;  Surgeon: Ruby Cola, MD;  Location: [facility];  Service:  ENT;  Laterality: N/A;  . TONSILLECTOMY  1990    Allergies  Allergen Reactions  . Cefdinir Swelling  . Ciprofloxacin Nausea And Vomiting    Immunization History  Administered Date(s) Administered  . Influenza-Unspecified 09/30/2017  . Meningococcal Polysaccharide 07/19/2011  . Pneumococcal Conjugate-13 11/11/2017  . Pneumococcal Polysaccharide-23 07/19/2011    Family History  Problem Relation Age of Onset  . Stroke Maternal Grandmother   . Stroke Paternal Grandmother   . Rheum arthritis Mother   . Multiple sclerosis Mother      Current Outpatient Medications:  .  albuterol (PROVENTIL HFA;VENTOLIN HFA) 108 (90 Base) MCG/ACT inhaler, Inhale 2 puffs into the lungs every 6 (six) hours as needed for wheezing or shortness of breath., Disp: 1 Inhaler, Rfl: 2 .  buPROPion (WELLBUTRIN XL) 150 MG 24 hr tablet, Take 150 mg by mouth daily. , Disp: , Rfl:  .  cetirizine (ZYRTEC) 10 MG tablet, Take 10 mg by mouth daily., Disp: , Rfl:  .  fluticasone (FLONASE) 50 MCG/ACT nasal spray, Place 1 spray into the  nose daily. , Disp: , Rfl:  .  fluticasone furoate-vilanterol (BREO ELLIPTA) 100-25 MCG/INH AEPB, Inhale 1 puff into the lungs daily., Disp: 60 each, Rfl: 5 .  hydroxychloroquine (PLAQUENIL) 200 MG tablet, Take 1 tablet by mouth twice daily Monday through Friday only., Disp: 120 tablet, Rfl: 0 .  sertraline (ZOLOFT) 100 MG tablet, Take 1 tablet by mouth daily., Disp: , Rfl:  .  VASCEPA 1 g CAPS, Take 2 capsules by mouth 2 (two) times daily., Disp: , Rfl:  .  Vitamin D, Ergocalciferol, (DRISDOL) 1.25 MG (50000 UT) CAPS capsule, Take 1 capsule (50,000 Units total) by mouth every 7 (seven) days., Disp: 12 capsule, Rfl: 0  Current Facility-Administered Medications:  .  chlorpheniramine-HYDROcodone (TUSSIONEX) 10-8 MG/5ML suspension 5 mL, 5 mL, Oral, Q12H PRN, Dutch Quint B, FNP      Objective:   Vitals:   02/25/18 1121  BP: 126/76  Pulse: 91  SpO2: 96%  Weight: 163 lb 12.8 oz (74.3 kg)  Height: 5\' 5"  (1.651 m)    Estimated body mass index is 27.26 kg/m as calculated from the following:   Height as of this encounter: 5\' 5"  (1.651 m).   Weight as of this encounter: 163 lb 12.8 oz (74.3 kg).  @WEIGHTCHANGE @  [employer]   02/25/18 1121  Weight: 163 lb 12.8 oz (74.3 kg)     Physical Exam  General Appearance:    Alert, cooperative, no distress, appears stated age - yes , Deconditioned looking - no , OBESE  - no, Sitting on Wheelchair -  no  Head:    Normocephalic, without obvious abnormality, atraumatic  Eyes:    PERRL, conjunctiva/corneas clear,  Ears:    Normal TM's and external ear canals, both ears  Nose:   Nares normal, septum midline, mucosa normal, no drainage    or sinus tenderness. OXYGEN ON  - no . Patient is @ ra   Throat:   Lips, mucosa, and tongue normal; teeth and gums normal. Cyanosis on lips - no  Neck:   Supple, symmetrical, trachea midline, no adenopathy;    thyroid:  no enlargement/tenderness/nodules; no carotid   bruit or JVD  Back:     Symmetric, no  curvature, ROM normal, no CVA tenderness  Lungs:     Distress - no , Wheeze nono, Barrell Chest - no, Purse lip breathing - no, Crackles - no   Chest Wall:    No  tenderness or deformity.    Heart:    Regular rate and rhythm, S1 and S2 normal, no rub   or gallop, Murmur - no  Breast Exam:    NOT DONE  Abdomen:     Soft, non-tender, bowel sounds active all four quadrants,    no masses, no organomegaly. Visceral obesity - no  Genitalia:   NOT DONE  Rectal:   NOT DONE  Extremities:   Extremities - normal, Has Cane - no, Clubbing - no, Edema - no  Pulses:   2+ and symmetric all extremities  Skin:   Stigmata of Connective Tissue Disease - no  Lymph nodes:   Cervical, supraclavicular, and axillary nodes normal  Psychiatric:  Neurologic:   Pleasant - yes, Anxious - nono, Flat affect - no  CAm-ICU - neg, Alert and Oriented x 3 - yes, Moves all 4s - yes, Speech - normal, Cognition - intact           Assessment:       ICD-10-CM   1. Uncomplicated asthma, unspecified asthma severity, unspecified whether persistent J45.909   2. SOB (shortness of breath) R06.02   3. Chronic fatigue R53.82        Plan:     Patient Instructions  Uncomplicated asthma, unspecified asthma severity, unspecified whether persistent  Glad you are significantly better after taking Breo -Continue Breo scheduled daily -Use albuterol as needed  SOB (shortness of breath) Chronic fatigue  -Glad some of this is better after better control of asthma -Residual shortness of breath could be from pulmonary hypertension related to lupus but October 2019 echocardiogram did not show evidence of pulmonary hypertension or any other heart issue -Therefore, residual shortness of breath and fatigue is due to lupus and physical deconditioning -I strongly recommended pulmonary rehabilitation at [facility] but given your schedule issues I respect the fact you cannot do it at this time -Therefore, we agreed that he would  start swimming at the [facility] on a regular basis as soon as possible  Follow-up 6 months or sooner; exam nitric oxide test at follow-up and asthma control questionnaire     SIGNATURE    Dr. Brand Males, M.D., F.C.C.P,  Pulmonary and Critical Care Medicine Staff Physician, [place] Director - Interstitial Lung Disease  Program  [facility] [place] [facility] [place], [place], [address]  Pager: [phone], If no answer or between  15:00h - 7:00h: call [phone] Telephone: [phone]  12:23 PM 02/25/2018

## 2018-03-13 ENCOUNTER — Ambulatory Visit: Payer: Self-pay | Admitting: Physician Assistant

## 2018-03-13 VITALS — BP 110/82 | HR 93 | Temp 99.0°F | Resp 14 | Wt 162.2 lb

## 2018-03-13 DIAGNOSIS — R6889 Other general symptoms and signs: Secondary | ICD-10-CM

## 2018-03-13 DIAGNOSIS — J101 Influenza due to other identified influenza virus with other respiratory manifestations: Secondary | ICD-10-CM

## 2018-03-13 LAB — POCT INFLUENZA A/B
Influenza A, POC: POSITIVE — AB
Influenza B, POC: NEGATIVE

## 2018-03-13 MED ORDER — BALOXAVIR MARBOXIL(40 MG DOSE) 2 X 20 MG PO TBPK: 40.0000 mg | ORAL_TABLET | Freq: Once | ORAL | 0 refills | Status: AC

## 2018-03-13 MED ORDER — PROMETHAZINE-DM 6.25-15 MG/5ML PO SYRP: 5.0000 mL | ORAL_SOLUTION | Freq: Four times a day (QID) | ORAL | 0 refills | Status: DC | PRN

## 2018-03-13 NOTE — Progress Notes (Signed)
MRN: [id number] DOB: Feb 21, 1980  Subjective:   Emma Franco is a 38 y.o. female presenting for chief complaint of 2 day hx of fever (Tmax 101), chills, productive cough (greensih mucus), nasal congestion, scratchy throat, and headache. Denies sinus pain, inability to swallow, voice change, wheezing, shortness of breath, chest tightness and chest pain, nausea, vomiting, abdominal pain and diarrhea. Has tried delsym with no full relief. Took ibuprofen this morning. Her kid was sick recently w/ flu like symptoms but she did not take her to the doctor because she was better in 2 days.  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  Patient has past medical history of seasonal allergies: On daily Zyrtec and Flonase, SLE: On Plaquenil, and lupus induced asthma.  Had flu shot this season. Denies smoking. Denies any other aggravating or relieving factors, no other questions or concerns.  Review of Systems  Constitutional: Negative for diaphoresis.  Eyes: Negative for blurred vision, double vision and photophobia.  Respiratory: Negative for hemoptysis.   Musculoskeletal: Negative for neck pain.  Neurological: Negative for dizziness, tingling, tremors, sensory change, focal weakness and weakness.    Emma Franco has a current medication list which includes the following prescription(s): cetirizine, fluticasone, fluticasone furoate-vilanterol, hydroxychloroquine, sertraline, vascepa, vitamin d (ergocalciferol), albuterol, and bupropion, and the following Facility-Administered Medications: chlorpheniramine-hydrocodone. Also is allergic to cefdinir and ciprofloxacin.  Emma Franco  has a past medical history of Arthritis, Environmental allergies, Esophageal reflux, Headache(784.0) (07/18/11), Hiatal hernia, Irritable bowel syndrome, Lupus ([place]), Migraines, PONV (postoperative nausea and vomiting), Postpartum care following cesarean delivery (10/9) (10/16/2013), Scoliosis, and Sinusitis, chronic. Also  has a past surgical  history that includes Cesarean section (03/2009); Nasal sinus surgery (03/2010; 07/18/11); Nasal septum surgery (2006); Repair dural / CSF leak (07/18/11); Tonsillectomy (1990); Cholecystectomy (12/1996); Sinus endo w/fusion (07/18/2011); and Cesarean section (Bilateral, 10/16/2013).   Objective:   Vitals: BP 110/82   Pulse 93   Temp 99 F (37.2 C)   Resp 14   Wt 162 lb 3.2 oz (73.6 kg)   SpO2 98%   BMI 26.99 kg/m   Physical Exam Vitals signs reviewed.  Constitutional:      General: She is not in acute distress.    Appearance: She is well-developed. She is not toxic-appearing.     Comments: Appears like she does not feel well sitting one exam table.   HENT:     Head: Normocephalic and atraumatic.     Right Ear: Tympanic membrane, ear canal and external ear normal.     Left Ear: Tympanic membrane, ear canal and external ear normal.     Nose: Mucosal edema, congestion and rhinorrhea present. Rhinorrhea is clear.     Right Sinus: No maxillary sinus tenderness or frontal sinus tenderness.     Left Sinus: No maxillary sinus tenderness or frontal sinus tenderness.     Mouth/Throat:     Lips: Pink.     Mouth: Mucous membranes are moist.     Pharynx: Uvula midline. Posterior oropharyngeal erythema present. No pharyngeal swelling, oropharyngeal exudate or uvula swelling.     Tonsils: Swelling: 0 on the right. 0 on the left.  Eyes:     Extraocular Movements: Extraocular movements intact.     Conjunctiva/sclera: Conjunctivae normal.     Pupils: Pupils are equal, round, and reactive to light.     Funduscopic exam:    Right eye: No AV nicking or papilledema. Red reflex present.        Left eye: No AV  nicking or papilledema. Red reflex present. Neck:     Musculoskeletal: Normal range of motion.  Cardiovascular:     Rate and Rhythm: Normal rate and regular rhythm.     Heart sounds: Normal heart sounds.  Pulmonary:     Effort: Pulmonary effort is normal. No tachypnea, accessory muscle usage  or respiratory distress.     Breath sounds: Normal breath sounds. No decreased breath sounds, wheezing, rhonchi or rales.  Lymphadenopathy:     Head:     Right side of head: No submental, submandibular, tonsillar, preauricular, posterior auricular or occipital adenopathy.     Left side of head: No submental, submandibular, tonsillar, preauricular, posterior auricular or occipital adenopathy.     Cervical: No cervical adenopathy.     Upper Body:     Right upper body: No supraclavicular adenopathy.     Left upper body: No supraclavicular adenopathy.  Skin:    General: Skin is warm and dry.  Neurological:     Mental Status: She is alert.     Cranial Nerves: Cranial nerves are intact.     Coordination: Finger-Nose-Finger Test and Heel to Saxman Test normal.     Gait: Gait is intact.     Results for orders placed or performed in visit on 03/13/18 (from the past 24 hour(s))  POCT Influenza A/B     Status: Abnormal   Collection Time: 03/13/18 12:09 PM  Result Value Ref Range   Influenza A, POC Positive (A) Negative   Influenza B, POC Negative Negative    Assessment and Plan :  1. Influenza A POCT influenza test positive for influenza A.  Appears like she does not feel well but NAD. VSS. Lungs CTAB.  Normal neuro exam. Discussed natural history of the disease and treatment options. Due to her PMH would rec antiviral therapy, supportive care, and close f/u with PCP. Provided Rx for xofluza. Encouraged rest, hydration, and to continue OTC tylenol or ibuprofen as prescribed for fever. Educated on proper Comptroller. Recommended wearing a mask daily especially around other people. Remain out of work until afebrile for 48 hours. Educated on potential complications of the flu. Advised to follow up with family doctor, local urgent care, or ED if develops any of these concerning symptoms or if current symptoms persist outside of discussed boundaries. - Baloxavir Marboxil,40 MG Dose, 2 x 20 MG TBPK;  Take 40 mg by mouth once for 1 dose.  Dispense: 1 each; Refill: 0 - promethazine-dextromethorphan (PROMETHAZINE-DM) 6.25-15 MG/5ML syrup; Take 5 mLs by mouth 4 (four) times daily as needed for cough.  Dispense: 118 mL; Refill: 0  2. Flu-like symptoms - POCT Influenza A/B   Tenna Delaine, PA-C  [facility] 03/13/2018 12:12 PM

## 2018-03-13 NOTE — Patient Instructions (Addendum)
Influenza, Adult  You have tested positive for the flu, you are contagious until you are fever free for 48 hours. Rest, stay hydrated, and eat light meals. Take antiviral as prescribed. Use promethazine cough syrup for cough.  Use ibuprofen or tylenol as prescribed for fever and general discomfort.Please stay out of work until you are no longer contagious. Two major complications after the flu are pneumonia and sinus infections. Please be aware of this and if you are not any better in 7-10 days or you develop worsening cough or sinus pressure, seek care at family doctor, urgent care, or ED. Continue to wash your hands and wear a mask daily especially around other people.   Influenza is also called "the flu." It is an infection in the lungs, nose, and throat (respiratory tract). It is caused by a virus. The flu causes symptoms that are similar to symptoms of a cold. It also causes a high fever and body aches. The flu spreads easily from person to person (is contagious). Getting a flu shot (influenza vaccination) every year is the best way to prevent the flu. What are the causes? This condition is caused by the influenza virus. You can get the virus by:  Breathing in droplets that are in the air from the cough or sneeze of a person who has the virus.  Touching something that has the virus on it (is contaminated) and then touching your mouth, nose, or eyes. What increases the risk? Certain things may make you more likely to get the flu. These include:  Not washing your hands often.  Having close contact with many people during cold and flu season.  Touching your mouth, eyes, or nose without first washing your hands.  Not getting a flu shot every year. You may have a higher risk for the flu, along with serious problems such as a lung infection (pneumonia), if you:  Are older than 65.  Are pregnant.  Have a weakened disease-fighting system (immune system) because of a disease or taking certain  medicines.  Have a long-term (chronic) illness, such as: ? Heart, kidney, or lung disease. ? Diabetes. ? Asthma.  Have a liver disorder.  Are very overweight (morbidly obese).  Have anemia. This is a condition that affects your red blood cells. What are the signs or symptoms? Symptoms usually begin suddenly and last 4-14 days. They may include:  Fever and chills.  Headaches, body aches, or muscle aches.  Sore throat.  Cough.  Runny or stuffy (congested) nose.  Chest discomfort.  Not wanting to eat as much as normal (poor appetite).  Weakness or feeling tired (fatigue).  Dizziness.  Feeling sick to your stomach (nauseous) or throwing up (vomiting). How is this treated? If the flu is found early, you can be treated with medicine that can help reduce how bad the illness is and how long it lasts (antiviral medicine). This may be given by mouth (orally) or through an IV tube. Taking care of yourself at home can help your symptoms get better. Your doctor may suggest:  Taking over-the-counter medicines.  Drinking plenty of fluids. The flu often goes away on its own. If you have very bad symptoms or other problems, you may be treated in a hospital. Follow these instructions at home:     Activity  Rest as needed. Get plenty of sleep.  Stay home from work or school as told by your doctor. ? Do not leave home until you do not have a fever for 24  hours without taking medicine. ? Leave home only to visit your doctor. Eating and drinking  Take an ORS (oral rehydration solution). This is a drink that is sold at pharmacies and stores.  Drink enough fluid to keep your pee (urine) pale yellow.  Drink clear fluids in small amounts as you are able. Clear fluids include: ? Water. ? Ice chips. ? Fruit juice that has water added (diluted fruit juice). ? Low-calorie sports drinks.  Eat bland, easy-to-digest foods in small amounts as you are able. These foods  include: ? Bananas. ? Applesauce. ? Rice. ? Lean meats. ? Toast. ? Crackers.  Do not eat or drink: ? Fluids that have a lot of sugar or caffeine. ? Alcohol. ? Spicy or fatty foods. General instructions  Take over-the-counter and prescription medicines only as told by your doctor.  Use a cool mist humidifier to add moisture to the air in your home. This can make it easier for you to breathe.  Cover your mouth and nose when you cough or sneeze.  Wash your hands with soap and water often, especially after you cough or sneeze. If you cannot use soap and water, use alcohol-based hand sanitizer.  Keep all follow-up visits as told by your doctor. This is important. How is this prevented?   Get a flu shot every year. You may get the flu shot in late summer, fall, or winter. Ask your doctor when you should get your flu shot.  Avoid contact with people who are sick during fall and winter (cold and flu season). Contact a doctor if:  You get new symptoms.  You have: ? Chest pain. ? Watery poop (diarrhea). ? A fever.  Your cough gets worse.  You start to have more mucus.  You feel sick to your stomach.  You throw up. Get help right away if you:  Have shortness of breath.  Have trouble breathing.  Have skin or nails that turn a bluish color.  Have very bad pain or stiffness in your neck.  Get a sudden headache.  Get sudden pain in your face or ear.  Cannot eat or drink without throwing up. Summary  Influenza ("the flu") is an infection in the lungs, nose, and throat. It is caused by a virus.  Take over-the-counter and prescription medicines only as told by your doctor.  Getting a flu shot every year is the best way to avoid getting the flu. This information is not intended to replace advice given to you by your health care provider. Make sure you discuss any questions you have with your health care provider. Document Released: 10/04/2007 Document Revised:  06/12/2017 Document Reviewed: 06/12/2017 Elsevier Interactive Patient Education  2019 [employer].

## 2018-03-20 ENCOUNTER — Other Ambulatory Visit: Payer: Self-pay | Admitting: Physician Assistant

## 2018-03-20 NOTE — Telephone Encounter (Signed)
Last Visit: 12/25/17 Next visit: 05/28/18 Labs: 12/25/17 WNL PLQ Eye Exam: 01/21/18 WNL   Okay to refill per Dr. Estanislado Pandy

## 2018-03-25 ENCOUNTER — Other Ambulatory Visit: Payer: Self-pay | Admitting: Rheumatology

## 2018-03-25 NOTE — Telephone Encounter (Signed)
Patient advised we will need to recheck Vitamin D level before refill. Patient asked if she should continue to work as she has autoimmune disorder. Patient advised  If she can work from home she should do so. Patient states she is unable to. Advised patient if she chooses that she needs to come out of work then we can provided her with a letter.

## 2018-04-04 ENCOUNTER — Other Ambulatory Visit: Payer: Self-pay

## 2018-04-04 DIAGNOSIS — E559 Vitamin D deficiency, unspecified: Secondary | ICD-10-CM

## 2018-04-04 DIAGNOSIS — Z79899 Other long term (current) drug therapy: Secondary | ICD-10-CM | POA: Diagnosis not present

## 2018-04-05 LAB — CBC WITH DIFFERENTIAL/PLATELET
Absolute Monocytes: 639 cells/uL (ref 200–950)
BASOS PCT: 0.8 %
Basophils Absolute: 50 cells/uL (ref 0–200)
Eosinophils Absolute: 508 cells/uL — ABNORMAL HIGH (ref 15–500)
Eosinophils Relative: 8.2 %
HCT: 39.8 % (ref 35.0–45.0)
HEMOGLOBIN: 13.3 g/dL (ref 11.7–15.5)
Lymphs Abs: 1730 cells/uL (ref 850–3900)
MCH: 27.9 pg (ref 27.0–33.0)
MCHC: 33.4 g/dL (ref 32.0–36.0)
MCV: 83.4 fL (ref 80.0–100.0)
MONOS PCT: 10.3 %
MPV: 10.6 fL (ref 7.5–12.5)
Neutro Abs: 3274 cells/uL (ref 1500–7800)
Neutrophils Relative %: 52.8 %
Platelets: 281 10*3/uL (ref 140–400)
RBC: 4.77 10*6/uL (ref 3.80–5.10)
RDW: 14 % (ref 11.0–15.0)
Total Lymphocyte: 27.9 %
WBC: 6.2 10*3/uL (ref 3.8–10.8)

## 2018-04-05 LAB — COMPLETE METABOLIC PANEL WITH GFR
AG Ratio: 1.4 (calc) (ref 1.0–2.5)
ALKALINE PHOSPHATASE (APISO): 53 U/L (ref 31–125)
ALT: 7 U/L (ref 6–29)
AST: 13 U/L (ref 10–30)
Albumin: 3.8 g/dL (ref 3.6–5.1)
BILIRUBIN TOTAL: 0.4 mg/dL (ref 0.2–1.2)
BUN: 7 mg/dL (ref 7–25)
CO2: 27 mmol/L (ref 20–32)
Calcium: 9.2 mg/dL (ref 8.6–10.2)
Chloride: 104 mmol/L (ref 98–110)
Creat: 0.73 mg/dL (ref 0.50–1.10)
GFR, Est African American: 121 mL/min/{1.73_m2} (ref >=60)
GFR, Est Non African American: 104 mL/min/{1.73_m2} (ref >=60)
Globulin: 2.8 g/dL (calc) (ref 1.9–3.7)
Glucose, Bld: 70 mg/dL (ref 65–99)
POTASSIUM: 4 mmol/L (ref 3.5–5.3)
SODIUM: 138 mmol/L (ref 135–146)
Total Protein: 6.6 g/dL (ref 6.1–8.1)

## 2018-04-05 LAB — VITAMIN D 25 HYDROXY (VIT D DEFICIENCY, FRACTURES): Vit D, 25-Hydroxy: 42 ng/mL (ref 30–100)

## 2018-04-05 NOTE — Progress Notes (Signed)
Pt should continue Vit D 2000 qd OTC.

## 2018-04-14 ENCOUNTER — Encounter: Payer: Self-pay | Admitting: Rheumatology

## 2018-04-28 ENCOUNTER — Other Ambulatory Visit: Payer: Self-pay | Admitting: Rheumatology

## 2018-04-28 DIAGNOSIS — M359 Systemic involvement of connective tissue, unspecified: Secondary | ICD-10-CM

## 2018-04-28 NOTE — Telephone Encounter (Signed)
Last Visit: 12/25/2017 Next Visit: 05/28/2018 Labs: 04/04/2018 Absolute eosinophils is borderline elevated. Rest of CBC WNL. Eye exam:  01/21/2018  Okay to refill per Dr. Estanislado Pandy.

## 2018-04-30 DIAGNOSIS — Z01419 Encounter for gynecological examination (general) (routine) without abnormal findings: Secondary | ICD-10-CM | POA: Diagnosis not present

## 2018-04-30 DIAGNOSIS — Z6828 Body mass index (BMI) 28.0-28.9, adult: Secondary | ICD-10-CM | POA: Diagnosis not present

## 2018-05-21 NOTE — Progress Notes (Signed)
Office Visit Note  Patient: Emma Franco             Date of Birth: August 19, 1980           MRN: [id number]             PCP: Sinda Du, MD Referring: Sinda Du, MD Visit Date: 05/28/2018 Occupation: @GUAROCC @  Subjective:  Fatigue     History of Present Illness: Emma Franco is a 38 y.o. female with history of autoimmune disease and discoid lupus.  She is taking PLQ 200 mg BID M-F.  She has not missed any doses recently.  She denies any recent flares.  She denies any discoid lesions.  She continues to photosensitivity and wear sunscreen on a daily basis.  She states that if she is out in the sun she develops increased fatigue. she continues to have chronic hair loss but does not notice any worsening thinning.  She has chronic fatigue but states that she has been sleeping better recently.  She denies any recent fevers or swollen lymph nodes.  She has intermittent oral ulcerations but no nasal ulcerations.  She continues to have chronic sicca symptoms.  She denies any symptoms of Raynaud's.  She denies any joint pain or joint swelling.  She has myalgias and muscle tenderness but denies any muscle weakness.  She denies any shortness of breath or palpitations.  She continues to see Dr. Chase Caller.  She states her SOB has improved since starting on Breo.  She was evaluated on 05/22/18 for dysuria, and she was started on Macrobid 100 mg BID.  She has completed the course.             Activities of Daily Living:  Patient reports morning stiffness for 30  minutes.   Patient Denies nocturnal pain.  Difficulty dressing/grooming: Denies Difficulty climbing stairs: Denies Difficulty getting out of chair: Denies Difficulty using hands for taps, buttons, cutlery, and/or writing: Denies  Review of Systems  Constitutional: Positive for fatigue.  HENT: Positive for mouth dryness. Negative for mouth sores and nose dryness.   Eyes: Positive for dryness. Negative for pain and  visual disturbance.  Respiratory: Negative for cough, hemoptysis, shortness of breath and difficulty breathing.   Cardiovascular: Negative for chest pain, palpitations, hypertension and swelling in legs/feet.  Gastrointestinal: Positive for constipation. Negative for blood in stool and diarrhea.  Endocrine: Negative for increased urination.  Genitourinary: Negative for painful urination.  Musculoskeletal: Positive for myalgias, morning stiffness and myalgias. Negative for arthralgias, joint pain, joint swelling, muscle weakness and muscle tenderness.  Skin: Positive for hair loss and sensitivity to sunlight. Negative for color change, pallor, rash, nodules/bumps, skin tightness and ulcers.  Allergic/Immunologic: Negative for susceptible to infections.  Neurological: Negative for dizziness, numbness, headaches and weakness.  Hematological: Negative for swollen glands.  Psychiatric/Behavioral: Negative for depressed mood and sleep disturbance. The patient is nervous/anxious.     PMFS History:  Patient Active Problem List   Diagnosis Date Noted  . SOB (shortness of breath) 12/23/2017  . Autoimmune disease ([place]) 06/26/2017  . History of miscarriage 06/26/2017  . Chronic sinusitis 05/21/2017  . History of anxiety 05/21/2017  . Scoliosis 05/21/2017  . Family history of rheumatoid arthritis 05/21/2017  . Previous cesarean delivery, delivered 10/19/2013  . Acute blood loss anemia 10/17/2013  . Postpartum care following cesarean delivery (10/9) 10/16/2013  . Gastroenteritis 09/11/2013  . ESOPHAGITIS 03/18/2008  . HIATAL HERNIA 03/18/2008  . GERD 02/16/2008  . IRRITABLE BOWEL  SYNDROME 02/06/2008  . ARTHRITIS 02/06/2008  . ABDOMINAL PAIN-EPIGASTRIC 02/06/2008    Past Medical History:  Diagnosis Date  . 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 Environmental allergies   . Esophageal reflux   . Headache(784.0) 07/18/11   "qd for the last year; CSF leak; repaired today"  . Hiatal hernia   . Irritable  bowel syndrome   . Lupus ([place])   . Migraines    "once in a blue moon"  . PONV (postoperative nausea and vomiting)   . Postpartum care following cesarean delivery (10/9) 10/16/2013  . Scoliosis    mild  . Sinusitis, chronic     Family History  Problem Relation Age of Onset  . Stroke Maternal Grandmother   . Stroke Paternal Grandmother   . Rheum arthritis Mother   . Multiple sclerosis Mother    Past Surgical History:  Procedure Laterality Date  . CESAREAN SECTION  03/2009  . CESAREAN SECTION Bilateral 10/16/2013   Procedure: REPEAT CESAREAN SECTION WITH BILATERAL TUBAL LIGATION ;  Surgeon: Claiborne Billings A. Pamala Hurry, MD;  Location: [facility];  Service: Obstetrics;  Laterality: Bilateral;   EDD: 10/26/13  . CHOLECYSTECTOMY  12/1996  . NASAL SEPTUM SURGERY  2006  . NASAL SINUS SURGERY  03/2010; 07/18/11  . REPAIR DURAL / CSF LEAK  07/18/11  . SINUS ENDO W/FUSION  07/18/2011   Procedure: ENDOSCOPIC SINUS SURGERY WITH FUSION NAVIGATION;  Surgeon: Ruby Cola, MD;  Location: [facility];  Service: ENT;  Laterality: N/A;  . TONSILLECTOMY  1990  . TUBAL LIGATION     Social History   Social History Narrative  . Not on file   Immunization History  Administered Date(s) Administered  . Influenza-Unspecified 09/30/2017  . Meningococcal Polysaccharide 07/19/2011  . Pneumococcal Conjugate-13 11/11/2017  . Pneumococcal Polysaccharide-23 07/19/2011     Objective: Vital Signs: BP 115/84 (BP Location: Left Arm, Patient Position: Sitting, Cuff Size: Normal)   Pulse 96   Resp 12   Ht 5\' 5"  (1.651 m)   Wt 169 lb 6.4 oz (76.8 kg)   LMP 05/08/2018   BMI 28.19 kg/m    Physical Exam Vitals signs and nursing note reviewed.  Constitutional:      Appearance: She is well-developed.  HENT:     Head: Normocephalic and atraumatic.  Eyes:     Conjunctiva/sclera: Conjunctivae normal.  Neck:     Musculoskeletal: Normal range of motion.  Cardiovascular:     Rate and Rhythm: Normal rate and regular rhythm.      Heart sounds: Normal heart sounds.  Pulmonary:     Effort: Pulmonary effort is normal.     Breath sounds: Normal breath sounds.  Abdominal:     General: Bowel sounds are normal.     Palpations: Abdomen is soft.  Lymphadenopathy:     Cervical: No cervical adenopathy.  Skin:    General: Skin is warm and dry.     Capillary Refill: Capillary refill takes less than 2 seconds.  Neurological:     Mental Status: She is alert and oriented to person, place, and time.  Psychiatric:        Behavior: Behavior normal.      Musculoskeletal Exam: C-spine, thoracic spine, spine good range of motion.  No midline spinal tenderness.  No SI joint tenderness.  Shoulder joints, elbow joints, wrist joints, MCPs, PIPs, DIPs good range of motion no synovitis.  Hip joints, knee joints, ankle joints, MTPs, PIPs, DIPs good range of motion no synovitis.  No  warmth or effusion bilateral knee joints.  No tenderness or swelling of ankle joints.  No tenderness over trochanteric bursa bilaterally.  CDAI Exam: CDAI Score: Not documented Patient Global Assessment: Not documented; Provider Global Assessment: Not documented Swollen: Not documented; Tender: Not documented Joint Exam   Not documented   There is currently no information documented on the homunculus. Go to the Rheumatology activity and complete the homunculus joint exam.  Investigation: No additional findings.  Imaging: No results found.  Recent Labs: Lab Results  Component Value Date   WBC 6.2 04/04/2018   HGB 13.3 04/04/2018   PLT 281 04/04/2018   NA 138 04/04/2018   K 4.0 04/04/2018   CL 104 04/04/2018   CO2 27 04/04/2018   GLUCOSE 70 04/04/2018   BUN 7 04/04/2018   CREATININE 0.73 04/04/2018   BILITOT 0.4 04/04/2018   ALKPHOS 131 (H) 09/11/2013   AST 13 04/04/2018   ALT 7 04/04/2018   PROT 6.6 04/04/2018   ALBUMIN 2.7 (L) 09/11/2013   CALCIUM 9.2 04/04/2018   GFRAA 121 04/04/2018    Speciality Comments: PLQ Eye Exam: 01/21/18 WNL  with Susanne Greenhouse OD PA  Procedures:  No procedures performed Allergies: Cefdinir and Ciprofloxacin   Assessment / Plan:     Visit Diagnoses: Autoimmune disease ([place]) - ANA positive, dsDNA 48, rest of the ENA negative, C3-C4 normal.  History of fatigue, hair loss, arthralgias, oral ulcers, sicca symptoms and photosensitivity: She has not had any signs or symptoms of a flare.  She has not had any discoid lesions recently.  She continues to have photosensitivity, and she was advised to wear sunscreen daily. She has chronic hair loss but has not noticed increased hair thinning recently. She has no synovitis on exam.  She experiences myalgias and fatigue, but she has not had any recent muscle weakness, fevers, or swollen lymph nodes.  She has chronic sicca symptoms and intermittent oral ulcerations.  She denies any shortness or breath or palpitations.  She has not had any symptoms of Raynaud's and no digital ulcerations or signs of gangrene were noted.  She will continue taking Plaquenil 200 mg BID M-F.  A refill was sent to the pharmacy today.  We will check autoimmune lab work. She was advised to notify us if she develops signs or symptoms of a flare.  She will follow up in 5 months. - Plan: hydroxychloroquine (PLAQUENIL) 200 MG tablet, COMPLETE METABOLIC PANEL WITH GFR, CBC with Differential/Platelet, Urinalysis, Routine w reflex microscopic, Anti-DNA antibody, double-stranded, C3 and C4, Sedimentation rate  High risk medication use- Plaquenil 200 mg twice daily Monday through Friday only.  Last Plaquenil eye exam normal on 01/21/2018.  Most recent CBC/CMP within normal limits on 04/04/2018 and will monitor every 5 months. 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 She has not had any recent rashes.  She continues to have photosensitivity, and she was encouraged to wear sunscreen  on a daily basis.     Other fatigue: She has chronic fatigue.  She has been sleeping better.  She has not had any recent fevers or swollen lymph nodes.  She was encouraged to stay active and exercise on a regular basis.    Other form of scoliosis of lumbar spine: She has no lower back pain at this time.   Other medical conditions are listed as follows:   History of gastroesophageal reflux (  GERD)  History of IBS - She has been evaluated by Dr. Collene Mares and was started on probiotics which helped improve her symptoms considerably  History of anxiety  Hiatal hernia   Orders: Orders Placed This Encounter  Procedures  . COMPLETE METABOLIC PANEL WITH GFR  . CBC with Differential/Platelet  . Urinalysis, Routine w reflex microscopic  . Anti-DNA antibody, double-stranded  . C3 and C4  . Sedimentation rate   Meds ordered this encounter  Medications  . hydroxychloroquine (PLAQUENIL) 200 MG tablet    Sig: Take 1 tablet (200 mg) by mouth twice daily Monday through Friday only.    Dispense:  120 tablet    Refill:  0    Face-to-face time spent with patient was 20 minutes. Greater than 50% of time was spent in counseling and coordination of care.  Follow-Up Instructions: Return in about 5 months (around 10/28/2018) for Autoimmune Disease.   Ofilia Neas, PA-C  Note - This record has been created using Dragon software.  Chart creation errors have been sought, but may not always  have been located. Such creation errors do not reflect on  the standard of medical care.

## 2018-05-22 ENCOUNTER — Ambulatory Visit
Admission: EM | Admit: 2018-05-22 | Discharge: 2018-05-22 | Disposition: A | Discharge status: Home / Self Care | Payer: 59 | Attending: Emergency Medicine | Admitting: Emergency Medicine

## 2018-05-22 ENCOUNTER — Other Ambulatory Visit: Payer: Self-pay

## 2018-05-22 DIAGNOSIS — N3001 Acute cystitis with hematuria: Secondary | ICD-10-CM

## 2018-05-22 LAB — POCT URINALYSIS DIP (MANUAL ENTRY)
Bilirubin, UA: NEGATIVE
Glucose, UA: NEGATIVE mg/dL
Ketones, POC UA: NEGATIVE mg/dL
Nitrite, UA: NEGATIVE
Protein Ur, POC: 30 mg/dL — AB
Spec Grav, UA: 1.01 (ref 1.010–1.025)
Urobilinogen, UA: 0.2 E.U./dL
pH, UA: 8 (ref 5.0–8.0)

## 2018-05-22 LAB — POCT URINE PREGNANCY: Preg Test, Ur: NEGATIVE

## 2018-05-22 MED ORDER — NITROFURANTOIN MONOHYD MACRO 100 MG PO CAPS: 100.0000 mg | ORAL_CAPSULE | Freq: Two times a day (BID) | ORAL | 0 refills | Status: DC

## 2018-05-22 MED ORDER — PHENAZOPYRIDINE HCL 200 MG PO TABS: 200.0000 mg | ORAL_TABLET | Freq: Three times a day (TID) | ORAL | 0 refills | Status: DC

## 2018-05-22 NOTE — Discharge Instructions (Signed)
Urine culture sent.  We will call you with the results.   Push fluids and get plenty of rest.   Take antibiotic as directed and to completion Take pyridium as prescribed and as needed for symptomatic relief Follow up with PCP if symptoms persists Return here or go to ER if you have any new or worsening symptoms such as fever, worsening abdominal pain, nausea/vomiting, flank pain, etc... 

## 2018-05-22 NOTE — ED Provider Notes (Signed)
[facility]   CC: Burning with urination  SUBJECTIVE:  Emma Franco is a 38 y.o. female hx significant arthritis, environmental allergies, esophageal reflux, migraines, hiatal hernia, IBS, lupus, scoliosis, who complains of urinary frequency, burning with urination, and blood in urine that began 1 day ago.  Patient denies a precipitating event, recent sexual encounter, excessive caffeine intake. Denies associated abdominal or flank pain.  Has tried OTC medications AZO without relief.  Symptoms are made worse with urination.  Admits to similar symptoms in the past.  Complains of mild associated nausea.  Denies fever, chills, vomiting, abdominal pain, flank pain, abnormal vaginal discharge or bleeding.    LMP: Patient's last menstrual period was 05/08/2018. Tubal ligation.    ROS: As in HPI.  Past Medical History:  Diagnosis Date  . 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 Environmental allergies   . Esophageal reflux   . Headache(784.0) 07/18/11   "qd for the last year; CSF leak; repaired today"  . Hiatal hernia   . Irritable bowel syndrome   . Lupus ([place])   . Migraines    "once in a blue moon"  . PONV (postoperative nausea and vomiting)   . Postpartum care following cesarean delivery (10/9) 10/16/2013  . Scoliosis    mild  . Sinusitis, chronic    Past Surgical History:  Procedure Laterality Date  . CESAREAN SECTION  03/2009  . CESAREAN SECTION Bilateral 10/16/2013   Procedure: REPEAT CESAREAN SECTION WITH BILATERAL TUBAL LIGATION ;  Surgeon: Claiborne Billings A. Pamala Hurry, MD;  Location: [facility];  Service: Obstetrics;  Laterality: Bilateral;   EDD: 10/26/13  . CHOLECYSTECTOMY  12/1996  . NASAL SEPTUM SURGERY  2006  . NASAL SINUS SURGERY  03/2010; 07/18/11  . REPAIR DURAL / CSF LEAK  07/18/11  . SINUS ENDO W/FUSION  07/18/2011   Procedure: ENDOSCOPIC SINUS SURGERY WITH FUSION NAVIGATION;  Surgeon: Ruby Cola, MD;  Location: [facility];  Service: ENT;  Laterality: N/A;  . TONSILLECTOMY  1990   . TUBAL LIGATION     Allergies  Allergen Reactions  . Cefdinir Swelling  . Ciprofloxacin Nausea And Vomiting   No current facility-administered medications on file prior to encounter.    Current Outpatient Medications on File Prior to Encounter  Medication Sig Dispense Refill  . albuterol (PROVENTIL HFA;VENTOLIN HFA) 108 (90 Base) MCG/ACT inhaler Inhale 2 puffs into the lungs every 6 (six) hours as needed for wheezing or shortness of breath. (Patient not taking: Reported on 03/13/2018) 1 Inhaler 2  . buPROPion (WELLBUTRIN XL) 150 MG 24 hr tablet Take 150 mg by mouth daily.     . cetirizine (ZYRTEC) 10 MG tablet Take 10 mg by mouth daily.    . fluticasone (FLONASE) 50 MCG/ACT nasal spray Place 1 spray into the nose daily.     . fluticasone furoate-vilanterol (BREO ELLIPTA) 100-25 MCG/INH AEPB Inhale 1 puff into the lungs daily. 60 each 5  . hydroxychloroquine (PLAQUENIL) 200 MG tablet TAKE 1 TABLET TWICE DAILY MONDAY THROUGH FRIDAY. 120 tablet 0  . VASCEPA 1 g CAPS Take 2 capsules by mouth 2 (two) times daily.    . Vitamin D, Ergocalciferol, (DRISDOL) 1.25 MG (50000 UT) CAPS capsule Take 1 capsule (50,000 Units total) by mouth every 7 (seven) days. 12 capsule 0   Social History   Socioeconomic History  . Marital status: Married    Spouse name: Not on file  . Number of children: 1  . Years of education: Not on  file  . Highest education level: Not on file  Occupational History  . Occupation: CASEWORKER    Employer: [employer]  . Financial resource strain: Not on file  . Food insecurity:    Worry: Not on file    Inability: Not on file  . Transportation needs:    Medical: Not on file    Non-medical: Not on file  Tobacco Use  . Smoking status: Never Smoker  . Smokeless tobacco: Never Used  Substance and Sexual Activity  . Alcohol use: No  . Drug use: No  . Sexual activity: Yes    Birth control/protection: None  Lifestyle  . Physical activity:    Days per  week: Not on file    Minutes per session: Not on file  . Stress: Not on file  Relationships  . Social connections:    Talks on phone: Not on file    Gets together: Not on file    Attends religious service: Not on file    Active member of club or organization: Not on file    Attends meetings of clubs or organizations: Not on file    Relationship status: Not on file  . Intimate partner violence:    Fear of current or ex partner: Not on file    Emotionally abused: Not on file    Physically abused: Not on file    Forced sexual activity: Not on file  Other Topics Concern  . Not on file  Social History Narrative  . Not on file   Family History  Problem Relation Age of Onset  . Stroke Maternal Grandmother   . Stroke Paternal Grandmother   . Rheum arthritis Mother   . Multiple sclerosis Mother     OBJECTIVE:  Vitals:   05/22/18 1740  BP: 129/82  Pulse: (!) 101  Resp: 20  Temp: 98 F (36.7 C)  SpO2: 99%   General appearance: Alert in no acute distress HEENT: NCAT.  Lungs: clear to auscultation bilaterally without adventitious breath sounds Heart: regular rate and rhythm.  Radial pulses 2+ symmetrical bilaterally Abdomen: soft; non-distended; no tenderness; bowel sounds present; no guarding Back: no CVA tenderness Extremities: no edema; symmetrical with no gross deformities Skin: warm and dry Neurologic: Ambulates from chair to exam table without difficulty Psychological: alert and cooperative; normal mood and affect  Labs Reviewed  POCT URINALYSIS DIP (MANUAL ENTRY) - Abnormal; Notable for the following components:      Result Value   Color, UA red (*)    Clarity, UA cloudy (*)    Blood, UA large (*)    Protein Ur, POC =30 (*)    Leukocytes, UA Moderate (2+) (*)    All other components within normal limits  URINE CULTURE  POCT URINE PREGNANCY    ASSESSMENT & PLAN:  1. Acute cystitis with hematuria     Meds ordered this encounter  Medications  . DISCONTD:  nitrofurantoin, macrocrystal-monohydrate, (MACROBID) 100 MG capsule    Sig: Take 1 capsule (100 mg total) by mouth 2 (two) times daily.    Dispense:  10 capsule    Refill:  0    Order Specific Question:   Supervising Provider    Answer:   Raylene Everts [[id number]]  . DISCONTD: phenazopyridine (PYRIDIUM) 200 MG tablet    Sig: Take 1 tablet (200 mg total) by mouth 3 (three) times daily.    Dispense:  6 tablet    Refill:  0    Order  Specific Question:   Supervising Provider    Answer:   Raylene Everts [[id number]]  . nitrofurantoin, macrocrystal-monohydrate, (MACROBID) 100 MG capsule    Sig: Take 1 capsule (100 mg total) by mouth 2 (two) times daily.    Dispense:  10 capsule    Refill:  0    Order Specific Question:   Supervising Provider    Answer:   Raylene Everts [[id number]]  . phenazopyridine (PYRIDIUM) 200 MG tablet    Sig: Take 1 tablet (200 mg total) by mouth 3 (three) times daily.    Dispense:  6 tablet    Refill:  0    Order Specific Question:   Supervising Provider    Answer:   Raylene Everts [[id number]]   Urine culture sent.  We will call you with the results.   Push fluids and get plenty of rest.   Take antibiotic as directed and to completion Take pyridium as prescribed and as needed for symptomatic relief Follow up with PCP if symptoms persists Return here or go to ER if you have any new or worsening symptoms such as fever, worsening abdominal pain, nausea/vomiting, flank pain, etc...  Outlined signs and symptoms indicating need for more acute intervention. Patient verbalized understanding. After Visit Summary given.     Lestine Box, PA-C 05/22/18 1800

## 2018-05-22 NOTE — ED Triage Notes (Signed)
Began having uti symptoms last night with frequency, then began having burning , noticed some blood in urine today

## 2018-05-24 LAB — URINE CULTURE: Culture: <10000 — AB

## 2018-05-28 ENCOUNTER — Other Ambulatory Visit: Payer: Self-pay

## 2018-05-28 ENCOUNTER — Ambulatory Visit: Payer: 59 | Admitting: Physician Assistant

## 2018-05-28 ENCOUNTER — Encounter: Payer: Self-pay | Admitting: Physician Assistant

## 2018-05-28 VITALS — BP 115/84 | HR 96 | Resp 12 | Ht 65.0 in | Wt 169.4 lb

## 2018-05-28 DIAGNOSIS — M4186 Other forms of scoliosis, lumbar region: Secondary | ICD-10-CM

## 2018-05-28 DIAGNOSIS — M359 Systemic involvement of connective tissue, unspecified: Secondary | ICD-10-CM

## 2018-05-28 DIAGNOSIS — Z79899 Other long term (current) drug therapy: Secondary | ICD-10-CM | POA: Diagnosis not present

## 2018-05-28 DIAGNOSIS — K449 Diaphragmatic hernia without obstruction or gangrene: Secondary | ICD-10-CM

## 2018-05-28 DIAGNOSIS — Z8659 Personal history of other mental and behavioral disorders: Secondary | ICD-10-CM

## 2018-05-28 DIAGNOSIS — L930 Discoid lupus erythematosus: Secondary | ICD-10-CM | POA: Diagnosis not present

## 2018-05-28 DIAGNOSIS — R5383 Other fatigue: Secondary | ICD-10-CM | POA: Diagnosis not present

## 2018-05-28 DIAGNOSIS — Z8719 Personal history of other diseases of the digestive system: Secondary | ICD-10-CM

## 2018-05-28 DIAGNOSIS — B373 Candidiasis of vulva and vagina: Secondary | ICD-10-CM | POA: Diagnosis not present

## 2018-05-28 MED ORDER — HYDROXYCHLOROQUINE SULFATE 200 MG PO TABS: ORAL_TABLET | ORAL | 0 refills | Status: DC

## 2018-05-29 LAB — COMPLETE METABOLIC PANEL WITH GFR
AG Ratio: 1.4 (calc) (ref 1.0–2.5)
ALT: 8 U/L (ref 6–29)
AST: 13 U/L (ref 10–30)
Albumin: 4.1 g/dL (ref 3.6–5.1)
Alkaline phosphatase (APISO): 53 U/L (ref 31–125)
BUN: 11 mg/dL (ref 7–25)
CO2: 25 mmol/L (ref 20–32)
Calcium: 8.9 mg/dL (ref 8.6–10.2)
Chloride: 105 mmol/L (ref 98–110)
Creat: 0.79 mg/dL (ref 0.50–1.10)
GFR, Est African American: 109 mL/min/{1.73_m2} (ref >=60)
GFR, Est Non African American: 94 mL/min/{1.73_m2} (ref >=60)
Globulin: 3 g/dL (calc) (ref 1.9–3.7)
Glucose, Bld: 75 mg/dL (ref 65–99)
Potassium: 4 mmol/L (ref 3.5–5.3)
Sodium: 139 mmol/L (ref 135–146)
Total Bilirubin: 0.4 mg/dL (ref 0.2–1.2)
Total Protein: 7.1 g/dL (ref 6.1–8.1)

## 2018-05-29 LAB — CBC WITH DIFFERENTIAL/PLATELET
Absolute Monocytes: 571 cells/uL (ref 200–950)
Basophils Absolute: 73 cells/uL (ref 0–200)
Basophils Relative: 1.3 %
Eosinophils Absolute: 554 cells/uL — ABNORMAL HIGH (ref 15–500)
Eosinophils Relative: 9.9 %
HCT: 42.9 % (ref 35.0–45.0)
Hemoglobin: 14.1 g/dL (ref 11.7–15.5)
Lymphs Abs: 1546 cells/uL (ref 850–3900)
MCH: 28.1 pg (ref 27.0–33.0)
MCHC: 32.9 g/dL (ref 32.0–36.0)
MCV: 85.6 fL (ref 80.0–100.0)
MPV: 11 fL (ref 7.5–12.5)
Monocytes Relative: 10.2 %
Neutro Abs: 2856 cells/uL (ref 1500–7800)
Neutrophils Relative %: 51 %
Platelets: 260 10*3/uL (ref 140–400)
RBC: 5.01 10*6/uL (ref 3.80–5.10)
RDW: 13.3 % (ref 11.0–15.0)
Total Lymphocyte: 27.6 %
WBC: 5.6 10*3/uL (ref 3.8–10.8)

## 2018-05-29 LAB — C3 AND C4
C3 Complement: 114 mg/dL (ref 83–193)
C4 Complement: 38 mg/dL (ref 15–57)

## 2018-05-29 LAB — URINALYSIS, ROUTINE W REFLEX MICROSCOPIC
Bacteria, UA: NONE SEEN /HPF
Bilirubin Urine: NEGATIVE
Glucose, UA: NEGATIVE
Hgb urine dipstick: NEGATIVE
Hyaline Cast: NONE SEEN /LPF
Ketones, ur: NEGATIVE
Nitrite: NEGATIVE
Protein, ur: NEGATIVE
RBC / HPF: NONE SEEN /HPF (ref 0–2)
Specific Gravity, Urine: 1.009 (ref 1.001–1.03)
pH: 5.5 (ref 5.0–8.0)

## 2018-05-29 LAB — ANTI-DNA ANTIBODY, DOUBLE-STRANDED: ds DNA Ab: 37 IU/mL — ABNORMAL HIGH

## 2018-05-29 LAB — SEDIMENTATION RATE: Sed Rate: 2 mm/h (ref 0–20)

## 2018-05-29 NOTE — Progress Notes (Signed)
CMP WNL. CBC stable.  Sed rate WNL.  Complements WNL.  UA revealed trace leukocytes.  She just completed a course of Macrobid for a UTI.  Please advise patient to follow up with PCP if symptoms return.

## 2018-05-29 NOTE — Progress Notes (Signed)
DsDNA elevated but stable.  Labs do not reveal signs of a flare.

## 2018-09-03 ENCOUNTER — Other Ambulatory Visit: Payer: Self-pay | Admitting: Internal Medicine

## 2018-09-04 ENCOUNTER — Other Ambulatory Visit: Payer: Self-pay

## 2018-09-04 DIAGNOSIS — Z79899 Other long term (current) drug therapy: Secondary | ICD-10-CM

## 2018-09-05 LAB — CBC WITH DIFFERENTIAL/PLATELET
Absolute Monocytes: 612 cells/uL (ref 200–950)
Basophils Absolute: 78 cells/uL (ref 0–200)
Basophils Relative: 1.3 %
Eosinophils Absolute: 372 cells/uL (ref 15–500)
Eosinophils Relative: 6.2 %
HCT: 38.6 % (ref 35.0–45.0)
Hemoglobin: 12.8 g/dL (ref 11.7–15.5)
Lymphs Abs: 2094 cells/uL (ref 850–3900)
MCH: 28.7 pg (ref 27.0–33.0)
MCHC: 33.2 g/dL (ref 32.0–36.0)
MCV: 86.5 fL (ref 80.0–100.0)
MPV: 10.5 fL (ref 7.5–12.5)
Monocytes Relative: 10.2 %
Neutro Abs: 2844 cells/uL (ref 1500–7800)
Neutrophils Relative %: 47.4 %
Platelets: 246 10*3/uL (ref 140–400)
RBC: 4.46 10*6/uL (ref 3.80–5.10)
RDW: 12.6 % (ref 11.0–15.0)
Total Lymphocyte: 34.9 %
WBC: 6 10*3/uL (ref 3.8–10.8)

## 2018-09-05 LAB — COMPLETE METABOLIC PANEL WITH GFR
AG Ratio: 1.7 (calc) (ref 1.0–2.5)
ALT: 7 U/L (ref 6–29)
AST: 13 U/L (ref 10–30)
Albumin: 3.8 g/dL (ref 3.6–5.1)
Alkaline phosphatase (APISO): 49 U/L (ref 31–125)
BUN/Creatinine Ratio: 9 (calc) (ref 6–22)
BUN: 6 mg/dL — ABNORMAL LOW (ref 7–25)
CO2: 27 mmol/L (ref 20–32)
Calcium: 8.7 mg/dL (ref 8.6–10.2)
Chloride: 102 mmol/L (ref 98–110)
Creat: 0.69 mg/dL (ref 0.50–1.10)
GFR, Est African American: 127 mL/min/{1.73_m2} (ref >=60)
GFR, Est Non African American: 110 mL/min/{1.73_m2} (ref >=60)
Globulin: 2.3 g/dL (calc) (ref 1.9–3.7)
Glucose, Bld: 85 mg/dL (ref 65–99)
Potassium: 3.8 mmol/L (ref 3.5–5.3)
Sodium: 136 mmol/L (ref 135–146)
Total Bilirubin: 0.4 mg/dL (ref 0.2–1.2)
Total Protein: 6.1 g/dL (ref 6.1–8.1)

## 2018-10-14 NOTE — Progress Notes (Signed)
Office Visit Note  Patient: Emma Franco             Date of Birth: [birth year]           MRN: [id number]             PCP: Sinda Du, MD Referring: Sinda Du, MD Visit Date: 10/28/2018 Occupation: @GUAROCC @  Subjective:  Trapezius muscle spasms    History of Present Illness: Emma Franco is a 38 y.o. female with history of autoimmune disease.  She is taking Plaquenil 200 mg 1 tablet by mouth twice daily M-F. She denies any symptoms of a lupus flare.  She states her symptoms have been stable. She is experiencing neck pain and stiffness.  She denies any radiating pain or numbness.  She states her arms feel weak if she holds them up for a prolonged period of time. She denies any other joint pain or joint swelling. She continues to have chronic fatigue.  She experiences ongoing sicca symptoms.  She denies any sores in mouth or nose.  She denies any symptoms of Raynaud's.  She has not had any recurrence of rashes or discoid lesions.  She continues to wear sunscreen daily.   Activities of Daily Living:  Patient reports morning stiffness for 30 minutes.   Patient Reports nocturnal pain.  Difficulty dressing/grooming: Denies Difficulty climbing stairs: Reports Difficulty getting out of chair: Denies Difficulty using hands for taps, buttons, cutlery, and/or writing: Denies  Review of Systems  Constitutional: Positive for fatigue.  HENT: Positive for mouth dryness. Negative for mouth sores and nose dryness.   Eyes: Positive for dryness. Negative for pain and visual disturbance.  Respiratory: Positive for shortness of breath. Negative for cough, hemoptysis and difficulty breathing.   Cardiovascular: Negative for chest pain, palpitations, hypertension and swelling in legs/feet.  Gastrointestinal: Negative for blood in stool, constipation and diarrhea.  Endocrine: Negative for increased urination.  Genitourinary: Negative for difficulty urinating and painful urination.   Musculoskeletal: Positive for arthralgias, joint pain and morning stiffness. Negative for joint swelling, myalgias, muscle weakness, muscle tenderness and myalgias.  Skin: Positive for hair loss. Negative for color change, pallor, rash, nodules/bumps, skin tightness, ulcers and sensitivity to sunlight.  Allergic/Immunologic: Negative for susceptible to infections.  Neurological: Negative for memory loss.  Hematological: Negative for swollen glands.  Psychiatric/Behavioral: Negative for depressed mood, confusion and sleep disturbance. The patient is not nervous/anxious.     PMFS History:  Patient Active Problem List   Diagnosis Date Noted  . SOB (shortness of breath) 12/23/2017  . Autoimmune disease ([place]) 06/26/2017  . History of miscarriage 06/26/2017  . Chronic sinusitis 05/21/2017  . History of anxiety 05/21/2017  . Scoliosis 05/21/2017  . Family history of rheumatoid arthritis 05/21/2017  . Previous cesarean delivery, delivered 10/19/2013  . Acute blood loss anemia 10/17/2013  . Postpartum care following cesarean delivery (10/9) 10/16/2013  . Gastroenteritis 09/11/2013  . ESOPHAGITIS 03/18/2008  . HIATAL HERNIA 03/18/2008  . GERD 02/16/2008  . IRRITABLE BOWEL SYNDROME 02/06/2008  . ARTHRITIS 02/06/2008  . ABDOMINAL PAIN-EPIGASTRIC 02/06/2008    Past Medical History:  Diagnosis Date  . 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 Environmental allergies   . Esophageal reflux   . Headache(784.0) 07/18/11   "qd for the last year; CSF leak; repaired today"  . Hiatal hernia   . Irritable bowel syndrome   . Lupus ([place])   . Migraines    "once in a blue moon"  .  PONV (postoperative nausea and vomiting)   . Postpartum care following cesarean delivery (10/9) 10/16/2013  . Scoliosis    mild  . Sinusitis, chronic     Family History  Problem Relation Age of Onset  . Stroke Maternal Grandmother   . Stroke Paternal Grandmother   . Rheum arthritis Mother   . Multiple sclerosis Mother     Past Surgical History:  Procedure Laterality Date  . CESAREAN SECTION  03/2009  . CESAREAN SECTION Bilateral 10/16/2013   Procedure: REPEAT CESAREAN SECTION WITH BILATERAL TUBAL LIGATION ;  Surgeon: Claiborne Billings A. Pamala Hurry, MD;  Location: [facility];  Service: Obstetrics;  Laterality: Bilateral;   EDD: 10/26/13  . CHOLECYSTECTOMY  12/1996  . NASAL SEPTUM SURGERY  2006  . NASAL SINUS SURGERY  03/2010; 07/18/11  . REPAIR DURAL / CSF LEAK  07/18/11  . SINUS ENDO W/FUSION  07/18/2011   Procedure: ENDOSCOPIC SINUS SURGERY WITH FUSION NAVIGATION;  Surgeon: Ruby Cola, MD;  Location: [facility];  Service: ENT;  Laterality: N/A;  . TONSILLECTOMY  1990  . TUBAL LIGATION     Social History   Social History Narrative  . Not on file   Immunization History  Administered Date(s) Administered  . Influenza-Unspecified 09/30/2017  . Meningococcal Polysaccharide 07/19/2011  . Pneumococcal Conjugate-13 11/11/2017  . Pneumococcal Polysaccharide-23 07/19/2011     Objective: Vital Signs: BP 127/86 (BP Location: Left Arm, Patient Position: Sitting, Cuff Size: Normal)   Pulse 88   Resp 13   Ht 5\' 5"  (1.651 m)   Wt 167 lb 12.8 oz (76.1 kg)   BMI 27.92 kg/m    Physical Exam Vitals signs and nursing note reviewed.  Constitutional:      Appearance: She is well-developed.  HENT:     Head: Normocephalic and atraumatic.  Eyes:     Conjunctiva/sclera: Conjunctivae normal.  Neck:     Musculoskeletal: Normal range of motion.  Cardiovascular:     Rate and Rhythm: Normal rate and regular rhythm.     Heart sounds: Normal heart sounds.  Pulmonary:     Effort: Pulmonary effort is normal.     Breath sounds: Normal breath sounds.  Abdominal:     General: Bowel sounds are normal.     Palpations: Abdomen is soft.  Lymphadenopathy:     Cervical: No cervical adenopathy.  Skin:    General: Skin is warm and dry.     Capillary Refill: Capillary refill takes less than 2 seconds.  Neurological:     Mental Status: She is  alert and oriented to person, place, and time.  Psychiatric:        Behavior: Behavior normal.      Musculoskeletal Exam: C-spine, thoracic spine, and lumbar spine good ROM.  Trapezius muscle tension and tenderness.  No midline spinal tenderness.  No SI joint tenderness.  Shoulder joints, elbow joints, wrist joints, MCPs,PIPs, and DIPs good ROM with no synovitis.  Hip joints, knee joints, ankle joints, MTPs, PIPs, and DIPs good ROM with no synovitis.  No warmth or effusion of knee joints. No tenderness or swelling of ankle joints.   CDAI Exam: CDAI Score: - Patient Global: -; Provider Global: - Swollen: -; Tender: - Joint Exam   No joint exam has been documented for this visit   There is currently no information documented on the homunculus. Go to the Rheumatology activity and complete the homunculus joint exam.  Investigation: No additional findings.  Imaging: No results found.  Recent Labs: Lab Results  Component  Value Date   WBC 6.0 09/04/2018   HGB 12.8 09/04/2018   PLT 246 09/04/2018   NA 136 09/04/2018   K 3.8 09/04/2018   CL 102 09/04/2018   CO2 27 09/04/2018   GLUCOSE 85 09/04/2018   BUN 6 (L) 09/04/2018   CREATININE 0.69 09/04/2018   BILITOT 0.4 09/04/2018   ALKPHOS 131 (H) 09/11/2013   AST 13 09/04/2018   ALT 7 09/04/2018   PROT 6.1 09/04/2018   ALBUMIN 2.7 (L) 09/11/2013   CALCIUM 8.7 09/04/2018   GFRAA 127 09/04/2018    Speciality Comments: PLQ Eye Exam: 01/21/18 WNL with Susanne Greenhouse OD PA  Procedures:  Trigger Point Inj  Date/Time: 10/28/2018 8:19 AM Performed by: Bo Merino, MD Authorized by: Bo Merino, MD   Consent Given by:  Patient Site marked: the procedure site was marked   Timeout: prior to procedure the correct patient, procedure, and site was verified   Indications:  Pain Total # of Trigger Points:  2 Location: neck   Needle Size:  27 G Approach:  Dorsal Medications #1:  0.5 mL lidocaine 1 %; 10 mg triamcinolone  acetonide 40 MG/ML Medications #2:  0.5 mL lidocaine 1 %; 10 mg triamcinolone acetonide 40 MG/ML Patient tolerance:  Patient tolerated the procedure well with no immediate complications   Allergies: Cefdinir and Ciprofloxacin   Assessment / Plan:     Visit Diagnoses: Autoimmune disease ([place]) - ANA positive, dsDNA 48, rest of the ENA negative, C3-C4 normal.  History of fatigue, hair loss, arthralgias, oral ulcers, sicca symptoms and photosensitivity: She has not had any signs or symptoms of a flare recently.  She is clinically doing well on Plaquenil 200 mg 1 tablet by mouth daily M-F.  She continues to have chronic sicca symptoms and fatigue.  She has not had any oral or nasal ulcerations.  No symptoms of Raynaud's.  No malar rash or discoid lesions noted.  She has chronic hair loss.  She presents today with trapezius muscle tenderness and spasms. Bilateral trigger point injections were performed today.  She has no other joint pain or joint swelling.  No synovitis noted.  We will check autoimmune labs today.  She will continue taking plaquenil as prescribed.  A refill of PLQ was sent to the pharmacy.  She was advised to notify us if she develops any signs or symptoms of a flare.  She will follow up in 5 months.  - Plan: Anti-DNA antibody, double-stranded, C3 and C4, Sedimentation rate, Urinalysis, Routine w reflex microscopic, hydroxychloroquine (PLAQUENIL) 200 MG tablet  High risk medication use -Plaquenil 200 mg 1 tablet twice daily Monday through Friday only.  Last Plaquenil eye exam normal on 01/21/2018.  Most recent CBC/CMP within normal limits on 09/04/2018.  She received the influenza vaccination.  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  She has no discoid lesions on exam.  She wears sunscreen daily.    Other fatigue: She has chronic fatigue.   Trapezius muscle spasm -She has trapezius muscle tension and muscle  tenderness.  She has been experiences muscle spasms intermittently.  She has tried using biofreeze topically.  She requested trigger point injections bilaterally.  She tolerated the procedure well.  Procedure note completed above.  We will check a CK today.  Plan: CK  Other form of scoliosis of lumbar spine: She has no discomfort at this time.   Other medical conditions are listed as follows:  History of anxiety  History of gastroesophageal reflux (GERD)  History of IBS - She has been evaluated by Dr. Collene Mares and was started on probiotics which helped improve her symptoms considerably  Hiatal hernia    Orders: Orders Placed This Encounter  Procedures  . Trigger Point Inj  . Anti-DNA antibody, double-stranded  . C3 and C4  . Sedimentation rate  . Urinalysis, Routine w reflex microscopic  . COMPLETE METABOLIC PANEL WITH GFR  . CBC with Differential/Platelet  . CK   Meds ordered this encounter  Medications  . hydroxychloroquine (PLAQUENIL) 200 MG tablet    Sig: Take 1 tablet (200 mg) by mouth twice daily Monday through Friday only.    Dispense:  120 tablet    Refill:  0    Face-to-face time spent with patient was 30 minutes. Greater than 50% of time was spent in counseling and coordination of care.  Follow-Up Instructions: Return in about 5 months (around 03/28/2019) for Autoimmune Disease.   Ofilia Neas, PA-C   I examined and evaluated the patient with Hazel Sams PA.  Patient has been experiencing increased fatigue.  She had no synovitis on examination today.  We will obtain her labs today.  She had bilateral trapezius spasm.  After different treatment options were discussed bilateral trapezius area were injected as described above.  She tolerated the procedure well.  The plan of care was discussed as noted above.  Bo Merino, MD  Note - This record has been created using Editor, commissioning.  Chart creation errors have been sought, but may not always  have been  located. Such creation errors do not reflect on  the standard of medical care.

## 2018-10-28 ENCOUNTER — Ambulatory Visit: Payer: 59 | Admitting: Rheumatology

## 2018-10-28 ENCOUNTER — Other Ambulatory Visit: Payer: Self-pay

## 2018-10-28 ENCOUNTER — Encounter: Payer: Self-pay | Admitting: Physician Assistant

## 2018-10-28 VITALS — BP 127/86 | HR 88 | Resp 13 | Ht 65.0 in | Wt 167.8 lb

## 2018-10-28 DIAGNOSIS — M359 Systemic involvement of connective tissue, unspecified: Secondary | ICD-10-CM

## 2018-10-28 DIAGNOSIS — L930 Discoid lupus erythematosus: Secondary | ICD-10-CM | POA: Diagnosis not present

## 2018-10-28 DIAGNOSIS — R5383 Other fatigue: Secondary | ICD-10-CM

## 2018-10-28 DIAGNOSIS — Z79899 Other long term (current) drug therapy: Secondary | ICD-10-CM | POA: Diagnosis not present

## 2018-10-28 DIAGNOSIS — M62838 Other muscle spasm: Secondary | ICD-10-CM

## 2018-10-28 DIAGNOSIS — K449 Diaphragmatic hernia without obstruction or gangrene: Secondary | ICD-10-CM

## 2018-10-28 DIAGNOSIS — Z8719 Personal history of other diseases of the digestive system: Secondary | ICD-10-CM

## 2018-10-28 DIAGNOSIS — M4186 Other forms of scoliosis, lumbar region: Secondary | ICD-10-CM

## 2018-10-28 DIAGNOSIS — Z8659 Personal history of other mental and behavioral disorders: Secondary | ICD-10-CM

## 2018-10-28 MED ORDER — TRIAMCINOLONE ACETONIDE 40 MG/ML IJ SUSP
10.0000 mg | INTRAMUSCULAR | Status: AC | PRN
  Administered 2018-10-28: 10 mg via INTRAMUSCULAR

## 2018-10-28 MED ORDER — LIDOCAINE HCL 1 % IJ SOLN
0.5000 mL | INTRAMUSCULAR | Status: AC | PRN
  Administered 2018-10-28: .5 mL

## 2018-10-28 MED ORDER — HYDROXYCHLOROQUINE SULFATE 200 MG PO TABS: ORAL_TABLET | ORAL | 0 refills | Status: DC

## 2018-10-29 LAB — COMPLETE METABOLIC PANEL WITH GFR
AG Ratio: 1.5 (calc) (ref 1.0–2.5)
ALT: 6 U/L (ref 6–29)
AST: 10 U/L (ref 10–30)
Albumin: 3.9 g/dL (ref 3.6–5.1)
Alkaline phosphatase (APISO): 51 U/L (ref 31–125)
BUN/Creatinine Ratio: 8 (calc) (ref 6–22)
BUN: 6 mg/dL — ABNORMAL LOW (ref 7–25)
CO2: 29 mmol/L (ref 20–32)
Calcium: 8.8 mg/dL (ref 8.6–10.2)
Chloride: 103 mmol/L (ref 98–110)
Creat: 0.78 mg/dL (ref 0.50–1.10)
GFR, Est African American: 111 mL/min/{1.73_m2} (ref >=60)
GFR, Est Non African American: 96 mL/min/{1.73_m2} (ref >=60)
Globulin: 2.6 g/dL (calc) (ref 1.9–3.7)
Glucose, Bld: 89 mg/dL (ref 65–99)
Potassium: 3.8 mmol/L (ref 3.5–5.3)
Sodium: 140 mmol/L (ref 135–146)
Total Bilirubin: 0.5 mg/dL (ref 0.2–1.2)
Total Protein: 6.5 g/dL (ref 6.1–8.1)

## 2018-10-29 LAB — ANTI-DNA ANTIBODY, DOUBLE-STRANDED: ds DNA Ab: 27 IU/mL — ABNORMAL HIGH

## 2018-10-29 LAB — URINALYSIS, ROUTINE W REFLEX MICROSCOPIC
Bilirubin Urine: NEGATIVE
Glucose, UA: NEGATIVE
Hgb urine dipstick: NEGATIVE
Ketones, ur: NEGATIVE
Leukocytes,Ua: NEGATIVE
Nitrite: NEGATIVE
Protein, ur: NEGATIVE
Specific Gravity, Urine: 1.014 (ref 1.001–1.03)
pH: 6.5 (ref 5.0–8.0)

## 2018-10-29 LAB — CBC WITH DIFFERENTIAL/PLATELET
Absolute Monocytes: 510 cells/uL (ref 200–950)
Basophils Absolute: 31 cells/uL (ref 0–200)
Basophils Relative: 0.6 %
Eosinophils Absolute: 291 cells/uL (ref 15–500)
Eosinophils Relative: 5.6 %
HCT: 38.9 % (ref 35.0–45.0)
Hemoglobin: 13 g/dL (ref 11.7–15.5)
Lymphs Abs: 1440 cells/uL (ref 850–3900)
MCH: 29.2 pg (ref 27.0–33.0)
MCHC: 33.4 g/dL (ref 32.0–36.0)
MCV: 87.4 fL (ref 80.0–100.0)
MPV: 10.8 fL (ref 7.5–12.5)
Monocytes Relative: 9.8 %
Neutro Abs: 2928 cells/uL (ref 1500–7800)
Neutrophils Relative %: 56.3 %
Platelets: 229 10*3/uL (ref 140–400)
RBC: 4.45 10*6/uL (ref 3.80–5.10)
RDW: 12.6 % (ref 11.0–15.0)
Total Lymphocyte: 27.7 %
WBC: 5.2 10*3/uL (ref 3.8–10.8)

## 2018-10-29 LAB — C3 AND C4
C3 Complement: 86 mg/dL (ref 83–193)
C4 Complement: 32 mg/dL (ref 15–57)

## 2018-10-29 LAB — SEDIMENTATION RATE: Sed Rate: 2 mm/h (ref 0–20)

## 2018-10-29 LAB — CK: Total CK: 47 U/L (ref 29–143)

## 2018-10-29 NOTE — Progress Notes (Signed)
CBC and CMP WNL.  UA normal. Complements WNL.  Sed rate WNL.  CK WNL.  DsDNA is elevated but trending down.   Please advise to notify us if she develops any signs or symptoms of a flare.

## 2019-01-30 ENCOUNTER — Encounter: Payer: Self-pay | Admitting: Rheumatology

## 2019-01-31 ENCOUNTER — Other Ambulatory Visit: Payer: Self-pay | Admitting: Physician Assistant

## 2019-01-31 DIAGNOSIS — M359 Systemic involvement of connective tissue, unspecified: Secondary | ICD-10-CM

## 2019-02-02 NOTE — Telephone Encounter (Signed)
Last Visit: 10/28/18  Next Visit: 03/24/19 Labs: 10/28/18 CBC and CMP WNL Eye exam: 01/29/2019 normal   Okay to refill per Dr. Estanislado Pandy

## 2019-03-05 ENCOUNTER — Ambulatory Visit: Payer: 59 | Admitting: Family Medicine

## 2019-03-05 ENCOUNTER — Encounter: Payer: Self-pay | Admitting: Family Medicine

## 2019-03-05 ENCOUNTER — Other Ambulatory Visit: Payer: Self-pay

## 2019-03-05 VITALS — BP 118/80 | HR 91 | Temp 98.1°F | Ht 65.0 in | Wt 162.8 lb

## 2019-03-05 DIAGNOSIS — M328 Other forms of systemic lupus erythematosus: Secondary | ICD-10-CM | POA: Diagnosis not present

## 2019-03-05 DIAGNOSIS — Z8659 Personal history of other mental and behavioral disorders: Secondary | ICD-10-CM

## 2019-03-05 DIAGNOSIS — R0602 Shortness of breath: Secondary | ICD-10-CM

## 2019-03-05 DIAGNOSIS — J328 Other chronic sinusitis: Secondary | ICD-10-CM

## 2019-03-05 MED ORDER — FLUTICASONE PROPIONATE 50 MCG/ACT NA SUSP: 1.0000 | NASAL | 11 refills | Status: AC | PRN

## 2019-03-05 MED ORDER — BUPROPION HCL ER (SR) 200 MG PO TB12: 200.0000 mg | ORAL_TABLET | Freq: Two times a day (BID) | ORAL | 5 refills | Status: AC

## 2019-03-05 MED ORDER — ESCITALOPRAM OXALATE 20 MG PO TABS: 20.0000 mg | ORAL_TABLET | Freq: Every day | ORAL | 5 refills | Status: AC

## 2019-03-05 NOTE — Progress Notes (Signed)
New Patient Office Visit  Subjective:  Patient ID: Emma Franco, female    DOB: 02/12/1980  Age: 39 y.o. MRN: [id number]  CC:  Chief Complaint  Patient presents with  . Establish Care  anxiety-pt on Wellbutrin BID and Lexapro daily  HPI Emma Franco presents for  DepressionWellbutrin XL 200mg  BID, Lexapro 20mg -"worry about silly things" anxiety related to scheduled events Rheumatology-plaquenil-Lupus -blood work + worsening exhaustion, eye appointment -Dr. Sabra Heck q 27months-now yearly DOE-Breo-pt does not use albuterol-diagnosis lupus induced asthma AR-flonase/zyrtec, sinus surgeries in the past Pt concerned that anxiety not well controlled.  Past Medical History:  Diagnosis Date  . Allergy   . Anxiety   . 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 Environmental allergies   . Esophageal reflux   . Headache(784.0) 07/18/11   "qd for the last year; CSF leak; repaired today"  . Hiatal hernia   . Irritable bowel syndrome   . Lupus ([place])   . Migraines    "once in a blue moon"  . PONV (postoperative nausea and vomiting)   . Postpartum care following cesarean delivery (10/9) 10/16/2013  . Scoliosis    mild  . Sinusitis, chronic     Past Surgical History:  Procedure Laterality Date  . CESAREAN SECTION  03/2009  . CESAREAN SECTION Bilateral 10/16/2013   Procedure: REPEAT CESAREAN SECTION WITH BILATERAL TUBAL LIGATION ;  Surgeon: Claiborne Billings A. Pamala Hurry, MD;  Location: [facility];  Service: Obstetrics;  Laterality: Bilateral;   EDD: 10/26/13  . CHOLECYSTECTOMY  12/1996  . NASAL SEPTUM SURGERY  2006  . NASAL SINUS SURGERY  03/2010; 07/18/11  . REPAIR DURAL / CSF LEAK  07/18/11  . SINUS ENDO W/FUSION  07/18/2011   Procedure: ENDOSCOPIC SINUS SURGERY WITH FUSION NAVIGATION;  Surgeon: Ruby Cola, MD;  Location: [place];  Service: ENT;  Laterality: N/A;  . TONSILLECTOMY  1990  . TUBAL LIGATION      Family History  Problem Relation Age of Onset  . Stroke Maternal Grandmother   . Stroke  Paternal Grandmother   . Rheum arthritis Mother   . Multiple sclerosis Mother     Social History  Pt has 16yo, 73 yo-works full time-social services-husband works at [employer] History  . Marital status: Married    Spouse name: Not on file  . Number of children: 1  . Years of education: Not on file  . Highest education level: Not on file  Occupational History  . Occupation: CASEWORKER    Employer: [employer]  Tobacco Use  . Smoking status: Never Smoker  . Smokeless tobacco: Never Used  Substance and Sexual Activity  . Alcohol use: No  . Drug use: No  . Sexual activity: Yes    Birth control/protection: None  Other Topics Concern  . Not on file  Social History Narrative  . Not on file   Social Determinants of Health   Financial Resource Strain:   . Difficulty of Paying Living Expenses: Not on file  Food Insecurity:   . Worried About Charity fundraiser in the Last Year: Not on file  . Ran Out of Food in the Last Year: Not on file  Transportation Needs:   . Lack of Transportation (Medical): Not on file  . Lack of Transportation (Non-Medical): Not on file  Physical Activity:   . Days of Exercise per Week: Not on file  . Minutes of Exercise per Session: Not on file  Stress:   .  Feeling of Stress : Not on file  Social Connections:   . Frequency of Communication with Friends and Family: Not on file  . Frequency of Social Gatherings with Friends and Family: Not on file  . Attends Religious Services: Not on file  . Active Member of Clubs or Organizations: Not on file  . Attends Archivist Meetings: Not on file  . Marital Status: Not on file  Intimate Partner Violence:   . Fear of Current or Ex-Partner: Not on file  . Emotionally Abused: Not on file  . Physically Abused: Not on file  . Sexually Abused: Not on file    ROS Review of Systems  Eyes: Negative.        Eye exam for rheumatology  Respiratory:       Asthma  Musculoskeletal:  Positive for arthralgias and joint swelling.  Neurological: Negative for headaches.  Psychiatric/Behavioral: Positive for agitation. Negative for dysphoric mood.    Objective:   Today's Vitals: BP 118/80 (BP Location: Left Arm, Patient Position: Sitting)   Pulse 91   Temp 98.1 F (36.7 C) (Temporal)   Ht 5\' 5"  (1.651 m)   Wt 162 lb 12.8 oz (73.8 kg)   SpO2 98%   BMI 27.09 kg/m   Physical Exam  Assessment & Plan:  1. History of anxiety wellbutrin 400mg  daily lexapro20mg  Pt with no CBT in the past 2 children, 1 on line school, full time work, husband with [employer] 2. Other forms of systemic lupus erythematosus, unspecified organ involvement status ([place]) Plaquenil-recent eye exam 3. Other chronic sinusitis Episodic infections 4. 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 albuterol (PROVENTIL HFA;VENTOLIN HFA) 108 (90 Base) MCG/ACT inhaler Inhale 2 puffs into the lungs every 6 (six) hours as needed for wheezing or shortness of breath.  Marland Kitchen BREO ELLIPTA 100-25 MCG/INH AEPB INHALE 1 PUFF ONCE DAILY.  Marland Kitchen buPROPion (WELLBUTRIN SR) 200 MG 12 hr tablet Take 200 mg by mouth 2 (two) times daily.  . cetirizine (ZYRTEC) 10 MG tablet Take 10 mg by mouth daily.  Marland Kitchen escitalopram (LEXAPRO) 20 MG tablet Take 20 mg by mouth daily.  . fluticasone (FLONASE) 50 MCG/ACT nasal spray Place 1 spray into the nose as needed.   . hydroxychloroquine (PLAQUENIL) 200 MG tablet TAKE 1 TABLET TWICE DAILY MONDAY THROUGH FRIDAY.  Marland Kitchen VITAMIN D PO Take by mouth daily.  . [DISCONTINUED] buPROPion (WELLBUTRIN XL) 150 MG 24 hr tablet Take 150 mg by mouth daily.    No facility-administered encounter medications on file as of 03/05/2019.    Follow-up: Behavioral health when available in [facility] Hannah Beat, MD

## 2019-03-05 NOTE — Patient Instructions (Signed)
Schedule counseling with mental health provider available in March

## 2019-03-16 NOTE — Progress Notes (Signed)
Office Visit Note  Patient: Emma Franco             Date of Birth: 1980-06-28           MRN: [id number]             PCP: Maryruth Hancock, MD Referring: Sinda Du, MD Visit Date: 03/24/2019 Occupation: @GUAROCC @  Subjective:  Night sweats  History of Present Illness: Geniyah Loerch is a 39 y.o. female with history of autoimmune disease and discoid lupus.  She is taking plaquenil 200 mg 1 tablet by mouth twice daily M-F.  She reports she has been experiencing more frequent night sweats. She will be following up with her gynecologist in May 2021. She has chronic fatigue which has been stable.  She denies any enlarged lymph nodes or exposure to sick contacts.  She has chronic sicca symptoms.  She has not had any recent oral or nasal ulcerations.  She denies any symptoms of Raynaud's. She denies any rashes or photosensitivity recently.  She continues to notice ongoing hair loss.   Activities of Daily Living:  Patient reports morning stiffness for 30 minutes.   Patient Denies nocturnal pain.  Difficulty dressing/grooming: Denies Difficulty climbing stairs: Denies Difficulty getting out of chair: Denies Difficulty using hands for taps, buttons, cutlery, and/or writing: Denies  Review of Systems  Constitutional: Positive for fatigue and night sweats.  HENT: Positive for mouth dryness. Negative for mouth sores and nose dryness.   Eyes: Positive for pain, itching and dryness. Negative for visual disturbance.  Respiratory: Negative for cough, hemoptysis, shortness of breath and difficulty breathing.   Cardiovascular: Negative for chest pain, palpitations, hypertension and swelling in legs/feet.  Gastrointestinal: Positive for constipation. Negative for blood in stool and diarrhea.  Endocrine: Negative for increased urination.  Genitourinary: Negative for difficulty urinating and painful urination.  Musculoskeletal: Positive for morning stiffness. Negative for arthralgias,  joint pain, joint swelling, myalgias, muscle weakness, muscle tenderness and myalgias.  Skin: Positive for hair loss. Negative for color change, pallor, rash, nodules/bumps, skin tightness, ulcers and sensitivity to sunlight.  Allergic/Immunologic: Negative for susceptible to infections.  Neurological: Positive for night sweats. Negative for numbness and memory loss.  Hematological: Negative for swollen glands.  Psychiatric/Behavioral: Negative for depressed mood, confusion and sleep disturbance. The patient is not nervous/anxious.     PMFS History:  Patient Active Problem List   Diagnosis Date Noted  . Other forms of systemic lupus erythematosus ([place]) 03/05/2019  . SOB (shortness of breath) 12/23/2017  . Autoimmune disease ([place]) 06/26/2017  . History of miscarriage 06/26/2017  . Chronic sinusitis 05/21/2017  . History of anxiety 05/21/2017  . Scoliosis 05/21/2017  . Family history of rheumatoid arthritis 05/21/2017  . Previous cesarean delivery, delivered 10/19/2013  . Acute blood loss anemia 10/17/2013  . Postpartum care following cesarean delivery (10/9) 10/16/2013  . Gastroenteritis 09/11/2013  . ESOPHAGITIS 03/18/2008  . HIATAL HERNIA 03/18/2008  . GERD 02/16/2008  . IRRITABLE BOWEL SYNDROME 02/06/2008  . ARTHRITIS 02/06/2008  . ABDOMINAL PAIN-EPIGASTRIC 02/06/2008    Past Medical History:  Diagnosis Date  . Allergy   . Anxiety   . 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 Environmental allergies   . Esophageal reflux   . Headache(784.0) 07/18/11   "qd for the last year; CSF leak; repaired today"  . Hiatal hernia   . Irritable bowel syndrome   . Lupus ([place])   . Migraines    "once in a  blue moon"  . PONV (postoperative nausea and vomiting)   . Postpartum care following cesarean delivery (10/9) 10/16/2013  . Scoliosis    mild  . Sinusitis, chronic     Family History  Problem Relation Age of Onset  . Stroke Maternal Grandmother   . Stroke Paternal Grandmother   . Rheum  arthritis Mother   . Multiple sclerosis Mother    Past Surgical History:  Procedure Laterality Date  . CESAREAN SECTION  03/2009  . CESAREAN SECTION Bilateral 10/16/2013   Procedure: REPEAT CESAREAN SECTION WITH BILATERAL TUBAL LIGATION ;  Surgeon: Claiborne Billings A. Pamala Hurry, MD;  Location: [facility];  Service: Obstetrics;  Laterality: Bilateral;   EDD: 10/26/13  . CHOLECYSTECTOMY  12/1996  . NASAL SEPTUM SURGERY  2006  . NASAL SINUS SURGERY  03/2010; 07/18/11  . REPAIR DURAL / CSF LEAK  07/18/11  . SINUS ENDO W/FUSION  07/18/2011   Procedure: ENDOSCOPIC SINUS SURGERY WITH FUSION NAVIGATION;  Surgeon: Ruby Cola, MD;  Location: [place];  Service: ENT;  Laterality: N/A;  . TONSILLECTOMY  1990  . TUBAL LIGATION     Social History   Social History Narrative  . Not on file   Immunization History  Administered Date(s) Administered  . Influenza-Unspecified 09/30/2017, 09/17/2018  . Meningococcal Polysaccharide 07/19/2011  . Pneumococcal Conjugate-13 11/11/2017  . Pneumococcal Polysaccharide-23 07/19/2011     Objective: Vital Signs: BP 113/78 (BP Location: Left Arm, Patient Position: Sitting, Cuff Size: Normal)   Pulse 91   Resp 13   Ht 5\' 5"  (1.651 m)   Wt 166 lb 6.4 oz (75.5 kg)   BMI 27.69 kg/m    Physical Exam Vitals and nursing note reviewed.  Constitutional:      Appearance: She is well-developed.  HENT:     Head: Normocephalic and atraumatic.  Eyes:     Conjunctiva/sclera: Conjunctivae normal.  Cardiovascular:     Rate and Rhythm: Normal rate and regular rhythm.     Pulses: Normal pulses.     Heart sounds: Normal heart sounds.  Pulmonary:     Effort: Pulmonary effort is normal.     Breath sounds: Normal breath sounds.  Abdominal:     General: Bowel sounds are normal.     Palpations: Abdomen is soft.  Musculoskeletal:     Cervical back: Normal range of motion.  Lymphadenopathy:     Cervical: No cervical adenopathy.  Skin:    General: Skin is warm and dry.     Capillary  Refill: Capillary refill takes less than 2 seconds.  Neurological:     Mental Status: She is alert and oriented to person, place, and time.  Psychiatric:        Behavior: Behavior normal.      Musculoskeletal Exam: C-spine, thoracic spine, and lumbar spine good ROM. Thoracolumbar scoliosis. Shoulder joints, elbow joints, wrist joints, MCPs, PIPs, and DIPs good ROM with no synovitis.  Complete fist formation bilaterally.  Hip joints, knee joints, ankle joints, MTPs, PIPs, and DIPs good ROM with no synovitis.  No warmth or effusion of knee joints.  No tenderness or swelling of ankle joints. No tenderness over trochanteric bursa bilaterally.   CDAI Exam: CDAI Score: -- Patient Global: --; Provider Global: -- Swollen: --; Tender: -- Joint Exam 03/24/2019   No joint exam has been documented for this visit   There is currently no information documented on the homunculus. Go to the Rheumatology activity and complete the homunculus joint exam.  Investigation: No additional findings.  Imaging: No results found.  Recent Labs: Lab Results  Component Value Date   WBC 5.2 10/28/2018   HGB 13.0 10/28/2018   PLT 229 10/28/2018   NA 140 10/28/2018   K 3.8 10/28/2018   CL 103 10/28/2018   CO2 29 10/28/2018   GLUCOSE 89 10/28/2018   BUN 6 (L) 10/28/2018   CREATININE 0.78 10/28/2018   BILITOT 0.5 10/28/2018   ALKPHOS 131 (H) 09/11/2013   AST 10 10/28/2018   ALT 6 10/28/2018   PROT 6.5 10/28/2018   ALBUMIN 2.7 (L) 09/11/2013   CALCIUM 8.8 10/28/2018   GFRAA 111 10/28/2018    Speciality Comments: PLQ Eye Exam: 01/29/2019 normal @ [facility], Dr. Teodoro Spray.  Procedures:  No procedures performed Allergies: Cefdinir and Ciprofloxacin   Assessment / Plan:     Visit Diagnoses: Autoimmune disease ([place]) - ANA positive, dsDNA 48, rest of the ENA negative, C3-C4 normal.  History of fatigue, hair loss, arthralgias, oral ulcers, sicca symptoms and photosensitivity: She has not had any  signs or symptoms of an autoimmune disease flare recently.  She is clinically doing well on Plaquenil 200 mg 1 tablet by mouth twice daily Monday through Friday.  She has no joint pain or synovitis on exam today.  She has not had any recent rashes, discoid lesions, or photosensitivity.  She continues to have ongoing hair loss.  She has not had any recent oral or nasal ulcerations but does have chronic sicca symptoms.  She has not had any symptoms of Raynaud's recently.  No digital ulcerations or signs of gangrene were noted.  She has not had any chest pain or shortness of breath recently and her lungs were clear to auscultation today.  She continues to have chronic fatigue which has been stable.  She has been having more frequent night sweats and is planning on following up with her gynecologist.  We will check a TSH and TB Gold today.  She will continue taking Plaquenil as prescribed.  We will check autoimmune lab work today.  She was advised to notify us if she develops signs or symptoms of a flare.  She will follow-up in the office in 5 months.- Plan: Urinalysis, Routine w reflex microscopic, COMPLETE METABOLIC PANEL WITH GFR, CBC with Differential/Platelet, Anti-DNA antibody, double-stranded, C3 and C4, Sedimentation rate  High risk medication use - Plaquenil 200 mg 1 tablet twice daily Monday through Friday only.  Eye Exam: 01/29/2019.  CBC and CMP were updated today to monitor for drug toxicity.   - Plan: COMPLETE METABOLIC PANEL WITH GFR, CBC with Differential/Platelet  Discoid lupus: She has not had any recent discoid lesions or rashes.  No photosensitivity.  We discussed the importance of wearing sunscreen greater than SPF 50 on a daily basis and avoiding direct sun exposure.  Other fatigue - She continues to have chronic fatigue which is stable.  She has been experiencing more frequent night sweats.  We will check a TSH and TB gold.  She is planning on following up with her gynecologist in May for  further evaluation.  Plan: TSH, QuantiFERON-TB Gold Plus  Night sweats -She has been experiencing more frequent night sweats recently.  She had a similar bout of night sweats about 1 year ago. Her fatigue has been stable.  No enlarged lymph nodes.  we will check TSH and TB gold today.  She will be following up with her gynecologist in May 2021. Plan: TSH, QuantiFERON-TB Gold Plus  Trapezius muscle spasm: She has not  had any trapezius muscle tension and muscle spasms recently.  Other form of scoliosis of lumbar spine: Chronic and unchanged.  She has no tenderness on exam.  Other medical conditions are listed as follows:  History of IBS - She has been evaluated by Dr. Collene Mares and was started on probiotics which helped improve her symptoms considerably  History of gastroesophageal reflux (GERD)  Hiatal hernia  History of anxiety   Orders: Orders Placed This Encounter  Procedures  . Urinalysis, Routine w reflex microscopic  . COMPLETE METABOLIC PANEL WITH GFR  . CBC with Differential/Platelet  . Anti-DNA antibody, double-stranded  . C3 and C4  . Sedimentation rate  . TSH  . QuantiFERON-TB Gold Plus   No orders of the defined types were placed in this encounter.     Follow-Up Instructions: Return in about 5 months (around 08/24/2019) for Autoimmune Disease.   Ofilia Neas, PA-C   I examined and evaluated the patient with Hazel Sams PA.  Patient had no synovitis on examination today.  Her autoimmune disease appears to be stable.  We will check autoimmune labs again.  She has been also experiencing night sweats.  Will obtain some additional labs including TSH and TB Gold.  I also advised her to discuss this further with her GYN.  The plan of care was discussed as noted above.  Bo Merino, MD  Note - This record has been created using Editor, commissioning.  Chart creation errors have been sought, but may not always  have been located. Such creation errors do not reflect on  the  standard of medical care.

## 2019-03-18 ENCOUNTER — Other Ambulatory Visit: Payer: Self-pay | Admitting: Family Medicine

## 2019-03-18 DIAGNOSIS — Z8659 Personal history of other mental and behavioral disorders: Secondary | ICD-10-CM

## 2019-03-24 ENCOUNTER — Other Ambulatory Visit: Payer: Self-pay

## 2019-03-24 ENCOUNTER — Encounter: Payer: Self-pay | Admitting: Physician Assistant

## 2019-03-24 ENCOUNTER — Ambulatory Visit: Payer: 59 | Admitting: Rheumatology

## 2019-03-24 VITALS — BP 113/78 | HR 91 | Resp 13 | Ht 65.0 in | Wt 166.4 lb

## 2019-03-24 DIAGNOSIS — Z79899 Other long term (current) drug therapy: Secondary | ICD-10-CM

## 2019-03-24 DIAGNOSIS — L930 Discoid lupus erythematosus: Secondary | ICD-10-CM | POA: Diagnosis not present

## 2019-03-24 DIAGNOSIS — R61 Generalized hyperhidrosis: Secondary | ICD-10-CM

## 2019-03-24 DIAGNOSIS — Z8719 Personal history of other diseases of the digestive system: Secondary | ICD-10-CM

## 2019-03-24 DIAGNOSIS — R5383 Other fatigue: Secondary | ICD-10-CM | POA: Diagnosis not present

## 2019-03-24 DIAGNOSIS — M62838 Other muscle spasm: Secondary | ICD-10-CM

## 2019-03-24 DIAGNOSIS — M359 Systemic involvement of connective tissue, unspecified: Secondary | ICD-10-CM

## 2019-03-24 DIAGNOSIS — Z8659 Personal history of other mental and behavioral disorders: Secondary | ICD-10-CM

## 2019-03-24 DIAGNOSIS — K449 Diaphragmatic hernia without obstruction or gangrene: Secondary | ICD-10-CM

## 2019-03-24 DIAGNOSIS — M4186 Other forms of scoliosis, lumbar region: Secondary | ICD-10-CM

## 2019-03-25 NOTE — Progress Notes (Signed)
UA revealed trace leukocytes.  Rest of UA WNL. If she develops symptoms of a UTI please advise pt to follow up with PCP.   CBC and CMP stable.   ESR WNL.  Complements WNL. DsDNA positive but trending down.  TSH WNL.   Labs are not consistent with a flare.

## 2019-03-26 LAB — SEDIMENTATION RATE: Sed Rate: 6 mm/h (ref 0–20)

## 2019-03-26 LAB — CBC WITH DIFFERENTIAL/PLATELET
Absolute Monocytes: 581 {cells}/uL (ref 200–950)
Basophils Absolute: 61 {cells}/uL (ref 0–200)
Basophils Relative: 1.2 %
Eosinophils Absolute: 515 {cells}/uL — ABNORMAL HIGH (ref 15–500)
Eosinophils Relative: 10.1 %
HCT: 39.3 % (ref 35.0–45.0)
Hemoglobin: 12.9 g/dL (ref 11.7–15.5)
Lymphs Abs: 1448 {cells}/uL (ref 850–3900)
MCH: 28.5 pg (ref 27.0–33.0)
MCHC: 32.8 g/dL (ref 32.0–36.0)
MCV: 86.9 fL (ref 80.0–100.0)
MPV: 11 fL (ref 7.5–12.5)
Monocytes Relative: 11.4 %
Neutro Abs: 2494 {cells}/uL (ref 1500–7800)
Neutrophils Relative %: 48.9 %
Platelets: 201 Thousand/uL (ref 140–400)
RBC: 4.52 Million/uL (ref 3.80–5.10)
RDW: 12.5 % (ref 11.0–15.0)
Total Lymphocyte: 28.4 %
WBC: 5.1 Thousand/uL (ref 3.8–10.8)

## 2019-03-26 LAB — URINALYSIS, ROUTINE W REFLEX MICROSCOPIC
Bacteria, UA: NONE SEEN /HPF
Bilirubin Urine: NEGATIVE
Glucose, UA: NEGATIVE
Hgb urine dipstick: NEGATIVE
Hyaline Cast: NONE SEEN /LPF
Ketones, ur: NEGATIVE
Nitrite: NEGATIVE
Protein, ur: NEGATIVE
RBC / HPF: NONE SEEN /HPF (ref 0–2)
Specific Gravity, Urine: 1.01 (ref 1.001–1.03)
pH: 5.5 (ref 5.0–8.0)

## 2019-03-26 LAB — COMPLETE METABOLIC PANEL WITH GFR
AG Ratio: 1.7 (calc) (ref 1.0–2.5)
ALT: 7 U/L (ref 6–29)
AST: 12 U/L (ref 10–30)
Albumin: 3.7 g/dL (ref 3.6–5.1)
Alkaline phosphatase (APISO): 41 U/L (ref 31–125)
BUN: 7 mg/dL (ref 7–25)
CO2: 25 mmol/L (ref 20–32)
Calcium: 8.7 mg/dL (ref 8.6–10.2)
Chloride: 106 mmol/L (ref 98–110)
Creat: 0.72 mg/dL (ref 0.50–1.10)
GFR, Est African American: 122 mL/min/{1.73_m2} (ref >=60)
GFR, Est Non African American: 105 mL/min/{1.73_m2} (ref >=60)
Globulin: 2.2 g/dL (calc) (ref 1.9–3.7)
Glucose, Bld: 99 mg/dL (ref 65–99)
Potassium: 3.9 mmol/L (ref 3.5–5.3)
Sodium: 137 mmol/L (ref 135–146)
Total Bilirubin: 0.3 mg/dL (ref 0.2–1.2)
Total Protein: 5.9 g/dL — ABNORMAL LOW (ref 6.1–8.1)

## 2019-03-26 LAB — C3 AND C4
C3 Complement: 92 mg/dL (ref 83–193)
C4 Complement: 31 mg/dL (ref 15–57)

## 2019-03-26 LAB — QUANTIFERON-TB GOLD PLUS
Mitogen-NIL: >10 [IU]/mL
NIL: 0.05 [IU]/mL
QuantiFERON-TB Gold Plus: NEGATIVE
TB1-NIL: <0 [IU]/mL
TB2-NIL: <0 [IU]/mL

## 2019-03-26 LAB — TSH: TSH: 4.11 m[IU]/L

## 2019-03-26 LAB — ANTI-DNA ANTIBODY, DOUBLE-STRANDED: ds DNA Ab: 24 [IU]/mL — ABNORMAL HIGH

## 2019-03-27 NOTE — Progress Notes (Signed)
TB gold negative

## 2019-04-02 ENCOUNTER — Ambulatory Visit
Admission: EM | Admit: 2019-04-02 | Discharge: 2019-04-02 | Disposition: A | Discharge status: Home / Self Care | Payer: 59 | Attending: Emergency Medicine | Admitting: Emergency Medicine

## 2019-04-02 ENCOUNTER — Other Ambulatory Visit: Payer: Self-pay

## 2019-04-02 DIAGNOSIS — Z20822 Contact with and (suspected) exposure to covid-19: Secondary | ICD-10-CM

## 2019-04-02 DIAGNOSIS — R05 Cough: Secondary | ICD-10-CM

## 2019-04-02 DIAGNOSIS — R059 Cough, unspecified: Secondary | ICD-10-CM

## 2019-04-02 DIAGNOSIS — J0100 Acute maxillary sinusitis, unspecified: Secondary | ICD-10-CM

## 2019-04-02 MED ORDER — PREDNISONE 10 MG (21) PO TBPK: ORAL_TABLET | Freq: Every day | ORAL | 0 refills | Status: DC

## 2019-04-02 MED ORDER — DOXYCYCLINE HYCLATE 100 MG PO CAPS: 100.0000 mg | ORAL_CAPSULE | Freq: Two times a day (BID) | ORAL | 0 refills | Status: DC

## 2019-04-02 MED ORDER — BENZONATATE 100 MG PO CAPS: 100.0000 mg | ORAL_CAPSULE | Freq: Three times a day (TID) | ORAL | 0 refills | Status: DC

## 2019-04-02 NOTE — Discharge Instructions (Signed)
COVID testing ordered.  It will take between 2-7 days for test results.  Someone will contact you regarding abnormal results.   Get plenty of rest and push fluids Use OTC zyrtec for nasal congestion, runny nose, and/or sore throat Use OTC flonase for nasal congestion and runny nose Use medications daily for symptom relief Doxycycline prescribed.  Take as directed and to completion Prednisone prescribed for congestion.  Take as directed and to completion.   Use OTC medications like ibuprofen or tylenol as needed fever or pain Call or go to the ED if you have any new or worsening symptoms such as fever, worsening cough, shortness of breath, chest tightness, chest pain, turning blue, changes in mental status, etc..Marland Kitchen

## 2019-04-02 NOTE — ED Triage Notes (Signed)
Pt began having scratchy throat last Monday and then developed cough over the weekend

## 2019-04-02 NOTE — ED Provider Notes (Signed)
[place]   [id number] 04/02/19 Arrival Time: [id number]   CC: COVID symptoms  SUBJECTIVE: History from: patient.  Kathyria Essien is a 39 y.o. female who presents with sore throat, sinus pain/ pressure, PND, sore throat, cough, and fatigue that began 10 days ago.  Denies sick exposure to COVID, flu or strep.  Has tried OTC medications without relief.  Symptoms are made worse at night.  Reports previous symptoms in the past with sinus infection.   Denies fever, chills, SOB, wheezing, chest pain, nausea, vomiting, changes in bowel or bladder habits.    ROS: As per HPI.  All other pertinent ROS negative.     Past Medical History:  Diagnosis Date  . Allergy   . Anxiety   . 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 Environmental allergies   . Esophageal reflux   . Headache(784.0) 07/18/11   "qd for the last year; CSF leak; repaired today"  . Hiatal hernia   . Irritable bowel syndrome   . Lupus ([place])   . Migraines    "once in a blue moon"  . PONV (postoperative nausea and vomiting)   . Postpartum care following cesarean delivery (10/9) 10/16/2013  . Scoliosis    mild  . Sinusitis, chronic    Past Surgical History:  Procedure Laterality Date  . CESAREAN SECTION  03/2009  . CESAREAN SECTION Bilateral 10/16/2013   Procedure: REPEAT CESAREAN SECTION WITH BILATERAL TUBAL LIGATION ;  Surgeon: Claiborne Billings A. Pamala Hurry, MD;  Location: [facility];  Service: Obstetrics;  Laterality: Bilateral;   EDD: 10/26/13  . CHOLECYSTECTOMY  12/1996  . NASAL SEPTUM SURGERY  2006  . NASAL SINUS SURGERY  03/2010; 07/18/11  . REPAIR DURAL / CSF LEAK  07/18/11  . SINUS ENDO W/FUSION  07/18/2011   Procedure: ENDOSCOPIC SINUS SURGERY WITH FUSION NAVIGATION;  Surgeon: Ruby Cola, MD;  Location: [facility];  Service: ENT;  Laterality: N/A;  . TONSILLECTOMY  1990  . TUBAL LIGATION     Allergies  Allergen Reactions  . Cefdinir Swelling  . Ciprofloxacin Nausea And Vomiting   No current facility-administered medications on  file prior to encounter.   Current Outpatient Medications on File Prior to Encounter  Medication Sig Dispense Refill  . BREO ELLIPTA 100-25 MCG/INH AEPB INHALE 1 PUFF ONCE DAILY. 60 each 0  . buPROPion (WELLBUTRIN SR) 200 MG 12 hr tablet Take 1 tablet (200 mg total) by mouth 2 (two) times daily. 60 tablet 5  . cetirizine (ZYRTEC) 10 MG tablet Take 10 mg by mouth daily.    Marland Kitchen escitalopram (LEXAPRO) 20 MG tablet Take 1 tablet (20 mg total) by mouth daily. 30 tablet 5  . fluticasone (FLONASE) 50 MCG/ACT nasal spray Place 1 spray into both nostrils as needed. 18.2 mL 11  . hydroxychloroquine (PLAQUENIL) 200 MG tablet TAKE 1 TABLET TWICE DAILY MONDAY THROUGH FRIDAY. 120 tablet 0  . VITAMIN D PO Take by mouth daily.     Social History   Socioeconomic History  . Marital status: Married    Spouse name: Not on file  . Number of children: 1  . Years of education: Not on file  . Highest education level: Not on file  Occupational History  . Occupation: CASEWORKER    Employer: [employer]  Tobacco Use  . Smoking status: Never Smoker  . Smokeless tobacco: Never Used  Substance and Sexual Activity  . Alcohol use: No  . Drug use: No  . Sexual activity: Yes  Birth control/protection: None  Other Topics Concern  . Not on file  Social History Narrative  . Not on file   Social Determinants of Health   Financial Resource Strain:   . Difficulty of Paying Living Expenses:   Food Insecurity:   . Worried About Charity fundraiser in the Last Year:   . Arboriculturist in the Last Year:   Transportation Needs:   . Film/video editor (Medical):   Marland Kitchen Lack of Transportation (Non-Medical):   Physical Activity:   . Days of Exercise per Week:   . Minutes of Exercise per Session:   Stress:   . Feeling of Stress :   Social Connections:   . Frequency of Communication with Friends and Family:   . Frequency of Social Gatherings with Friends and Family:   . Attends Religious Services:   .  Active Member of Clubs or Organizations:   . Attends Archivist Meetings:   Marland Kitchen Marital Status:   Intimate Partner Violence:   . Fear of Current or Ex-Partner:   . Emotionally Abused:   Marland Kitchen Physically Abused:   . Sexually Abused:    Family History  Problem Relation Age of Onset  . Stroke Maternal Grandmother   . Stroke Paternal Grandmother   . Rheum arthritis Mother   . Multiple sclerosis Mother     OBJECTIVE:  Vitals:   04/02/19 1325  BP: 130/88  Pulse: 98  Resp: 18  Temp: 98.2 F (36.8 C)  SpO2: 98%     General appearance: alert; appears fatigued, but nontoxic; speaking in full sentences and tolerating own secretions HEENT: NCAT; Ears: EACs clear, TMs pearly gray; Eyes: PERRL.  EOM grossly intact. Sinuses: TTP over maxillary sinuses; Nose: nares patent without rhinorrhea, Throat: oropharynx clear, tonsils non erythematous or enlarged, uvula midline  Neck: supple without LAD Lungs: unlabored respirations, symmetrical air entry; cough: mild; no respiratory distress; CTAB Heart: regular rate and rhythm.  Skin: warm and dry Psychological: alert and cooperative; normal mood and affect  ASSESSMENT & PLAN:  1. Suspected COVID-19 virus infection   2. Acute non-recurrent maxillary sinusitis   3. Cough     Meds ordered this encounter  Medications  . doxycycline (VIBRAMYCIN) 100 MG capsule    Sig: Take 1 capsule (100 mg total) by mouth 2 (two) times daily.    Dispense:  20 capsule    Refill:  0    Order Specific Question:   Supervising Provider    Answer:   Raylene Everts [id number]  . predniSONE (STERAPRED UNI-PAK 21 TAB) 10 MG (21) TBPK tablet    Sig: Take by mouth daily. Take 6 tabs by mouth daily  for 2 days, then 5 tabs for 2 days, then 4 tabs for 2 days, then 3 tabs for 2 days, 2 tabs for 2 days, then 1 tab by mouth daily for 2 days    Dispense:  42 tablet    Refill:  0    Order Specific Question:   Supervising Provider    Answer:   Raylene Everts  [id number]  . benzonatate (TESSALON) 100 MG capsule    Sig: Take 1 capsule (100 mg total) by mouth every 8 (eight) hours.    Dispense:  21 capsule    Refill:  0    Order Specific Question:   Supervising Provider    Answer:   Raylene Everts [id number]   COVID testing ordered.  It will take between 2-7 days  for test results.  Someone will contact you regarding abnormal results.   Get plenty of rest and push fluids Use OTC zyrtec for nasal congestion, runny nose, and/or sore throat Use OTC flonase for nasal congestion and runny nose Use medications daily for symptom relief Doxycycline prescribed.  Take as directed and to completion Prednisone prescribed for congestion.  Take as directed and to completion.   Use OTC medications like ibuprofen or tylenol as needed fever or pain Call or go to the ED if you have any new or worsening symptoms such as fever, worsening cough, shortness of breath, chest tightness, chest pain, turning blue, changes in mental status, etc...   Reviewed expectations re: course of current medical issues. Questions answered. Outlined signs and symptoms indicating need for more acute intervention. Patient verbalized understanding. After Visit Summary given.         Lestine Box, PA-C 04/02/19 1347

## 2019-04-03 LAB — SARS-COV-2, NAA 2 DAY TAT

## 2019-04-03 LAB — NOVEL CORONAVIRUS, NAA: SARS-CoV-2, NAA: NOT DETECTED

## 2019-05-11 ENCOUNTER — Other Ambulatory Visit: Payer: Self-pay | Admitting: Rheumatology

## 2019-05-11 DIAGNOSIS — M359 Systemic involvement of connective tissue, unspecified: Secondary | ICD-10-CM

## 2019-05-11 NOTE — Telephone Encounter (Signed)
Last Visit: 03/24/2019 Next Visit: 08/24/2019 Labs: 03/24/2019 CBC and CMP stable.  Eye exam: 01/29/2019  Okay to refill per Dr. Estanislado Pandy.

## 2019-07-29 ENCOUNTER — Other Ambulatory Visit: Payer: Self-pay | Admitting: *Deleted

## 2019-07-29 DIAGNOSIS — Z79899 Other long term (current) drug therapy: Secondary | ICD-10-CM

## 2019-07-30 ENCOUNTER — Ambulatory Visit
Admission: EM | Admit: 2019-07-30 | Discharge: 2019-07-30 | Disposition: A | Discharge status: Home / Self Care | Payer: 59 | Attending: Emergency Medicine | Admitting: Emergency Medicine

## 2019-07-30 ENCOUNTER — Encounter: Payer: Self-pay | Admitting: Emergency Medicine

## 2019-07-30 DIAGNOSIS — Z1152 Encounter for screening for COVID-19: Secondary | ICD-10-CM

## 2019-07-30 DIAGNOSIS — J069 Acute upper respiratory infection, unspecified: Secondary | ICD-10-CM | POA: Diagnosis not present

## 2019-07-30 LAB — COMPLETE METABOLIC PANEL WITH GFR
AG Ratio: 1.7 (calc) (ref 1.0–2.5)
ALT: 13 U/L (ref 6–29)
AST: 15 U/L (ref 10–30)
Albumin: 3.9 g/dL (ref 3.6–5.1)
Alkaline phosphatase (APISO): 51 U/L (ref 31–125)
BUN: 10 mg/dL (ref 7–25)
CO2: 25 mmol/L (ref 20–32)
Calcium: 8.6 mg/dL (ref 8.6–10.2)
Chloride: 102 mmol/L (ref 98–110)
Creat: 0.86 mg/dL (ref 0.50–1.10)
GFR, Est African American: 98 mL/min/{1.73_m2} (ref >=60)
GFR, Est Non African American: 84 mL/min/{1.73_m2} (ref >=60)
Globulin: 2.3 g/dL (calc) (ref 1.9–3.7)
Glucose, Bld: 84 mg/dL (ref 65–99)
Potassium: 3.9 mmol/L (ref 3.5–5.3)
Sodium: 136 mmol/L (ref 135–146)
Total Bilirubin: 0.4 mg/dL (ref 0.2–1.2)
Total Protein: 6.2 g/dL (ref 6.1–8.1)

## 2019-07-30 LAB — CBC WITH DIFFERENTIAL/PLATELET
Absolute Monocytes: 639 cells/uL (ref 200–950)
Basophils Absolute: 62 cells/uL (ref 0–200)
Basophils Relative: 0.8 %
Eosinophils Absolute: 400 cells/uL (ref 15–500)
Eosinophils Relative: 5.2 %
HCT: 40.2 % (ref 35.0–45.0)
Hemoglobin: 13.3 g/dL (ref 11.7–15.5)
Lymphs Abs: 1471 cells/uL (ref 850–3900)
MCH: 28.4 pg (ref 27.0–33.0)
MCHC: 33.1 g/dL (ref 32.0–36.0)
MCV: 85.9 fL (ref 80.0–100.0)
MPV: 10.4 fL (ref 7.5–12.5)
Monocytes Relative: 8.3 %
Neutro Abs: 5128 cells/uL (ref 1500–7800)
Neutrophils Relative %: 66.6 %
Platelets: 205 10*3/uL (ref 140–400)
RBC: 4.68 10*6/uL (ref 3.80–5.10)
RDW: 12.6 % (ref 11.0–15.0)
Total Lymphocyte: 19.1 %
WBC: 7.7 10*3/uL (ref 3.8–10.8)

## 2019-07-30 MED ORDER — PREDNISONE 10 MG (21) PO TBPK: ORAL_TABLET | ORAL | 0 refills | Status: DC

## 2019-07-30 MED ORDER — AZITHROMYCIN 250 MG PO TABS: 250.0000 mg | ORAL_TABLET | Freq: Every day | ORAL | 0 refills | Status: DC

## 2019-07-30 NOTE — ED Provider Notes (Addendum)
[place]   [id number] 07/30/19 Arrival Time: 0809   CC: COVID symptoms  SUBJECTIVE: History from: patient.  Emma Franco is a 39 y.o. female who presented to the urgent care for complaint of chills, fever cough, nasal congestion, sinus pressure, headache for the past 3 days.  Denies sick exposure to COVID, flu or strep.  Denies recent travel.  Denies aggravating or alleviating symptoms.  Denies previous COVID infection.   Denies fatigue,  rhinorrhea, sore throat, SOB, wheezing, chest pain, nausea, vomiting, changes in bowel or bladder habits.    ROS: As per HPI.  All other pertinent ROS negative.     Past Medical History:  Diagnosis Date  . Allergy   . Anxiety   . 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 Environmental allergies   . Esophageal reflux   . Headache(784.0) 07/18/11   "qd for the last year; CSF leak; repaired today"  . Hiatal hernia   . Irritable bowel syndrome   . Lupus ([place])   . Migraines    "once in a blue moon"  . PONV (postoperative nausea and vomiting)   . Postpartum care following cesarean delivery (10/9) 10/16/2013  . Scoliosis    mild  . Sinusitis, chronic    Past Surgical History:  Procedure Laterality Date  . CESAREAN SECTION  03/2009  . CESAREAN SECTION Bilateral 10/16/2013   Procedure: REPEAT CESAREAN SECTION WITH BILATERAL TUBAL LIGATION ;  Surgeon: Claiborne Billings A. Pamala Hurry, MD;  Location: [facility];  Service: Obstetrics;  Laterality: Bilateral;   EDD: 10/26/13  . CHOLECYSTECTOMY  12/1996  . NASAL SEPTUM SURGERY  2006  . NASAL SINUS SURGERY  03/2010; 07/18/11  . REPAIR DURAL / CSF LEAK  07/18/11  . SINUS ENDO W/FUSION  07/18/2011   Procedure: ENDOSCOPIC SINUS SURGERY WITH FUSION NAVIGATION;  Surgeon: Ruby Cola, MD;  Location: [facility];  Service: ENT;  Laterality: N/A;  . TONSILLECTOMY  1990  . TUBAL LIGATION     Allergies  Allergen Reactions  . Cefdinir Swelling  . Ciprofloxacin Nausea And Vomiting   No current facility-administered  medications on file prior to encounter.   Current Outpatient Medications on File Prior to Encounter  Medication Sig Dispense Refill  . benzonatate (TESSALON) 100 MG capsule Take 1 capsule (100 mg total) by mouth every 8 (eight) hours. 21 capsule 0  . BREO ELLIPTA 100-25 MCG/INH AEPB INHALE 1 PUFF ONCE DAILY. 60 each 0  . buPROPion (WELLBUTRIN SR) 200 MG 12 hr tablet Take 1 tablet (200 mg total) by mouth 2 (two) times daily. 60 tablet 5  . cetirizine (ZYRTEC) 10 MG tablet Take 10 mg by mouth daily.    Marland Kitchen doxycycline (VIBRAMYCIN) 100 MG capsule Take 1 capsule (100 mg total) by mouth 2 (two) times daily. 20 capsule 0  . escitalopram (LEXAPRO) 20 MG tablet Take 1 tablet (20 mg total) by mouth daily. 30 tablet 5  . fluticasone (FLONASE) 50 MCG/ACT nasal spray Place 1 spray into both nostrils as needed. 18.2 mL 11  . hydroxychloroquine (PLAQUENIL) 200 MG tablet TAKE 1 TABLET TWICE DAILY MONDAY THROUGH FRIDAY. 120 tablet 0  . VITAMIN D PO Take by mouth daily.     Social History   Socioeconomic History  . Marital status: Married    Spouse name: Not on file  . Number of children: 1  . Years of education: Not on file  . Highest education level: Not on file  Occupational History  . Occupation: CASEWORKER  Employer: [employer]  Tobacco Use  . Smoking status: Never Smoker  . Smokeless tobacco: Never Used  Vaping Use  . Vaping Use: Never used  Substance and Sexual Activity  . Alcohol use: No  . Drug use: No  . Sexual activity: Yes    Birth control/protection: None  Other Topics Concern  . Not on file  Social History Narrative  . Not on file   Social Determinants of Health   Financial Resource Strain:   . Difficulty of Paying Living Expenses:   Food Insecurity:   . Worried About Charity fundraiser in the Last Year:   . Arboriculturist in the Last Year:   Transportation Needs:   . Film/video editor (Medical):   Marland Kitchen Lack of Transportation (Non-Medical):   Physical Activity:    . Days of Exercise per Week:   . Minutes of Exercise per Session:   Stress:   . Feeling of Stress :   Social Connections:   . Frequency of Communication with Friends and Family:   . Frequency of Social Gatherings with Friends and Family:   . Attends Religious Services:   . Active Member of Clubs or Organizations:   . Attends Archivist Meetings:   Marland Kitchen Marital Status:   Intimate Partner Violence:   . Fear of Current or Ex-Partner:   . Emotionally Abused:   Marland Kitchen Physically Abused:   . Sexually Abused:    Family History  Problem Relation Age of Onset  . Stroke Maternal Grandmother   . Stroke Paternal Grandmother   . Rheum arthritis Mother   . Multiple sclerosis Mother     OBJECTIVE:  Vitals:   07/30/19 0815 07/30/19 0818  BP:  112/76  Pulse:  (!) 109  Resp:  18  Temp:  99.5 F (37.5 C)  TempSrc:  Oral  SpO2:  98%  Weight: 170 lb (77.1 kg)   Height: 5\' 5"  (1.651 m)      General appearance: alert; appears fatigued, but nontoxic; speaking in full sentences and tolerating own secretions HEENT: NCAT; Ears: EACs clear, TMs pearly gray; Eyes: PERRL.  EOM grossly intact. Sinuses: nontender; Nose: nares patent without rhinorrhea, Throat: oropharynx clear, tonsils non erythematous or enlarged, uvula midline  Neck: supple without LAD Lungs: unlabored respirations, symmetrical air entry; cough: moderate; no respiratory distress; CTAB Heart: regular rate and rhythm.  Radial pulses 2+ symmetrical bilaterally Skin: warm and dry Psychological: alert and cooperative; normal mood and affect  LABS:  Results for orders placed or performed in visit on 07/29/19 (from the past 24 hour(s))  CBC with Differential/Platelet     Status: None   Collection Time: 07/29/19  1:50 PM  Result Value Ref Range   WBC 7.7 3.8 - 10.8 Thousand/uL   RBC 4.68 3.80 - 5.10 Million/uL   Hemoglobin 13.3 11.7 - 15.5 g/dL   HCT 40.2 35 - 45 %   MCV 85.9 80.0 - 100.0 fL   MCH 28.4 27.0 - 33.0 pg    MCHC 33.1 32.0 - 36.0 g/dL   RDW 12.6 11.0 - 15.0 %   Platelets 205 140 - 400 Thousand/uL   MPV 10.4 7.5 - 12.5 fL   Neutro Abs 5,128 1,500 - 7,800 cells/uL   Lymphs Abs 1,471 850 - 3,900 cells/uL   Absolute Monocytes 639 200 - 950 cells/uL   Eosinophils Absolute 400 15 - 500 cells/uL   Basophils Absolute 62 0 - 200 cells/uL   Neutrophils Relative % 66.6 %  Total Lymphocyte 19.1 %   Monocytes Relative 8.3 %   Eosinophils Relative 5.2 %   Basophils Relative 0.8 %  COMPLETE METABOLIC PANEL WITH GFR     Status: None   Collection Time: 07/29/19  1:50 PM  Result Value Ref Range   Glucose, Bld 84 65 - 99 mg/dL   BUN 10 7 - 25 mg/dL   Creat 0.86 0.50 - 1.10 mg/dL   GFR, Est Non African American 84 > OR = 60 mL/min/1.18m2   GFR, Est African American 98 > OR = 60 mL/min/1.55m2   BUN/Creatinine Ratio NOT APPLICABLE 6 - 22 (calc)   Sodium 136 135 - 146 mmol/L   Potassium 3.9 3.5 - 5.3 mmol/L   Chloride 102 98 - 110 mmol/L   CO2 25 20 - 32 mmol/L   Calcium 8.6 8.6 - 10.2 mg/dL   Total Protein 6.2 6.1 - 8.1 g/dL   Albumin 3.9 3.6 - 5.1 g/dL   Globulin 2.3 1.9 - 3.7 g/dL (calc)   AG Ratio 1.7 1.0 - 2.5 (calc)   Total Bilirubin 0.4 0.2 - 1.2 mg/dL   Alkaline phosphatase (APISO) 51 31 - 125 U/L   AST 15 10 - 30 U/L   ALT 13 6 - 29 U/L     ASSESSMENT & PLAN:  1. URI with cough and congestion   2. Encounter for screening for COVID-19     Meds ordered this encounter  Medications  . predniSONE (STERAPRED UNI-PAK 21 TAB) 10 MG (21) TBPK tablet    Sig: Take 6 tabs by mouth daily  for 1 days, then 5 tabs for 1 days, then 4 tabs for 1 days, then 3 tabs for 1 days, 2 tabs for 1 days, then 1 tab by mouth daily for 1 days    Dispense:  21 tablet    Refill:  0  . azithromycin (ZITHROMAX) 250 MG tablet    Sig: Take 1 tablet (250 mg total) by mouth daily. Take first 2 tablets together, then 1 every day until finished.    Dispense:  6 tablet    Refill:  0   Discharge Instructions.    COVID  testing ordered.  It will take between 2-7 days for test results.  Someone will contact you regarding abnormal results.    In the meantime: You should remain isolated in your home for 10 days from symptom onset AND greater than 24 hours after symptoms resolution (absence of fever without the use of fever-reducing medication and improvement in respiratory symptoms), whichever is longer Get plenty of rest and push fluids Continue Zyrtec, Flonase and tessalon for nasal congestion, runny nose, and/or sore throat and cough Prescribed azithromycin and prednisone Use medications daily for symptom relief Use OTC medications like ibuprofen or tylenol as needed fever or pain Call or go to the ED if you have any new or worsening symptoms such as fever, worsening cough, shortness of breath, chest tightness, chest pain, turning blue, changes in mental status, etc...   Reviewed expectations re: course of current medical issues. Questions answered. Outlined signs and symptoms indicating need for more acute intervention. Patient verbalized understanding. After Visit Summary given.      Note: This document was prepared using Dragon voice recognition software and may include unintentional dictation errors.    Emerson Monte, FNP 07/30/19 [place], [place], [place] 07/30/19 [phone]

## 2019-07-30 NOTE — Discharge Instructions (Signed)
COVID testing ordered.  It will take between 2-7 days for test results.  Someone will contact you regarding abnormal results.    In the meantime: You should remain isolated in your home for 10 days from symptom onset AND greater than 24 hours after symptoms resolution (absence of fever without the use of fever-reducing medication and improvement in respiratory symptoms), whichever is longer Get plenty of rest and push fluids Continue Zyrtec, Flonase and tessalon for nasal congestion, runny nose, and/or sore throat and cough Prescribed azithromycin and prednisone Use medications daily for symptom relief Use OTC medications like ibuprofen or tylenol as needed fever or pain Call or go to the ED if you have any new or worsening symptoms such as fever, worsening cough, shortness of breath, chest tightness, chest pain, turning blue, changes in mental status, etc..Marland Kitchen

## 2019-07-30 NOTE — ED Triage Notes (Signed)
Cough and sinus congestion x 3 days

## 2019-07-30 NOTE — Progress Notes (Signed)
CBC and CMP are normal.

## 2019-07-31 LAB — SARS-COV-2, NAA 2 DAY TAT

## 2019-07-31 LAB — NOVEL CORONAVIRUS, NAA: SARS-CoV-2, NAA: NOT DETECTED

## 2019-08-10 NOTE — Progress Notes (Signed)
Office Visit Note  Patient: Emma Franco             Date of Birth: 1980-06-25           MRN: [id number]             PCP: Maryruth Hancock, MD Referring: Maryruth Hancock, MD Visit Date: 08/24/2019 Occupation: _0 @  Subjective:  Fatigue   History of Present Illness: Aquanetta Schwarz is a 39 y.o. female with history of autoimmune disease and discoid lupus.  She is taking plaquenil 200 mg 1 tablet by mouth twice daily M-F.  She continues to tolerate PLQ without any side effects.  She states she has been experiencing increased fatigue recently despite sleeping well and not being under increased stress.  She denies any increased joint pain or joint swelling. She continues to have morning stiffness for about 30 minutes daily.  She denies any recent rashes, raynaud's, or photosensitivity.  She wears sunscreen daily.  She has had persistent hair thinning for the past several years. She has ongoing sicca symptoms but denies oral or nasal ulcerations.   Activities of Daily Living:  Patient reports morning stiffness for 30 minutes.   Patient Denies nocturnal pain.  Difficulty dressing/grooming: Denies Difficulty climbing stairs: Reports Difficulty getting out of chair: Denies Difficulty using hands for taps, buttons, cutlery, and/or writing: Denies  Review of Systems  Constitutional: Positive for fatigue.  HENT: Positive for mouth dryness. Negative for mouth sores and nose dryness.   Eyes: Positive for dryness. Negative for pain, itching and visual disturbance.  Respiratory: Negative for cough, hemoptysis, shortness of breath and difficulty breathing.   Cardiovascular: Negative for chest pain, palpitations, hypertension and swelling in legs/feet.  Gastrointestinal: Positive for constipation and diarrhea. Negative for blood in stool.  Endocrine: Negative for increased urination.  Genitourinary: Negative for difficulty urinating and painful urination.  Musculoskeletal: Positive for  myalgias, morning stiffness, muscle tenderness and myalgias. Negative for arthralgias, joint pain, joint swelling and muscle weakness.  Skin: Negative for color change, pallor, rash, hair loss, nodules/bumps, redness, skin tightness, ulcers and sensitivity to sunlight.  Allergic/Immunologic: Negative for susceptible to infections.  Neurological: Negative for numbness and memory loss.  Hematological: Negative for swollen glands.  Psychiatric/Behavioral: Negative for depressed mood, confusion and sleep disturbance. The patient is not nervous/anxious.     PMFS History:  Patient Active Problem List   Diagnosis Date Noted  . Other forms of systemic lupus erythematosus ([place]) 03/05/2019  . SOB (shortness of breath) 12/23/2017  . Autoimmune disease ([place]) 06/26/2017  . History of miscarriage 06/26/2017  . Chronic sinusitis 05/21/2017  . History of anxiety 05/21/2017  . Scoliosis 05/21/2017  . Family history of rheumatoid arthritis 05/21/2017  . Previous cesarean delivery, delivered 10/19/2013  . Acute blood loss anemia 10/17/2013  . Postpartum care following cesarean delivery (10/9) 10/16/2013  . Gastroenteritis 09/11/2013  . ESOPHAGITIS 03/18/2008  . HIATAL HERNIA 03/18/2008  . GERD 02/16/2008  . IRRITABLE BOWEL SYNDROME 02/06/2008  . ARTHRITIS 02/06/2008  . ABDOMINAL PAIN-EPIGASTRIC 02/06/2008    Past Medical History:  Diagnosis Date  . Allergy   . Anxiety   . 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 Environmental allergies   . Esophageal reflux   . Headache(784.0) 07/18/11   "qd for the last year; CSF leak; repaired today"  . Hiatal hernia   . Irritable bowel syndrome   . Lupus ([place])   . Migraines    "once in a  blue moon"  . PONV (postoperative nausea and vomiting)   . Postpartum care following cesarean delivery (10/9) 10/16/2013  . Scoliosis    mild  . Sinusitis, chronic     Family History  Problem Relation Age of Onset  . Stroke Maternal Grandmother   . Stroke Paternal Grandmother    . Rheum arthritis Mother   . Multiple sclerosis Mother    Past Surgical History:  Procedure Laterality Date  . CESAREAN SECTION  03/2009  . CESAREAN SECTION Bilateral 10/16/2013   Procedure: REPEAT CESAREAN SECTION WITH BILATERAL TUBAL LIGATION ;  Surgeon: Claiborne Billings A. Pamala Hurry, MD;  Location: [facility];  Service: Obstetrics;  Laterality: Bilateral;   EDD: 10/26/13  . CHOLECYSTECTOMY  12/1996  . NASAL SEPTUM SURGERY  2006  . NASAL SINUS SURGERY  03/2010; 07/18/11  . REPAIR DURAL / CSF LEAK  07/18/11  . SINUS ENDO W/FUSION  07/18/2011   Procedure: ENDOSCOPIC SINUS SURGERY WITH FUSION NAVIGATION;  Surgeon: Ruby Cola, MD;  Location: [facility];  Service: ENT;  Laterality: N/A;  . TONSILLECTOMY  1990  . TUBAL LIGATION     Social History   Social History Narrative  . Not on file   Immunization History  Administered Date(s) Administered  . Influenza-Unspecified 09/30/2017, 09/17/2018  . Meningococcal Polysaccharide 07/19/2011  . Pneumococcal Conjugate-13 11/11/2017  . Pneumococcal Polysaccharide-23 07/19/2011     Objective: Vital Signs: BP 118/82 (BP Location: Left Arm, Patient Position: Sitting, Cuff Size: Normal)   Pulse 82   Resp 14   Ht _0  (1.651 m)   Wt 173 lb 3.2 oz (78.6 kg)   LMP 07/28/2019   BMI 28.82 kg/m    Physical Exam Vitals and nursing note reviewed.  Constitutional:      Appearance: She is well-developed.  HENT:     Head: Normocephalic and atraumatic.  Eyes:     Conjunctiva/sclera: Conjunctivae normal.  Pulmonary:     Effort: Pulmonary effort is normal.  Abdominal:     General: Bowel sounds are normal.     Palpations: Abdomen is soft.  Musculoskeletal:     Cervical back: Normal range of motion.  Lymphadenopathy:     Cervical: No cervical adenopathy.  Skin:    General: Skin is warm and dry.     Capillary Refill: Capillary refill takes less than 2 seconds.  Neurological:     Mental Status: She is alert and oriented to person, place, and time.    Psychiatric:        Behavior: Behavior normal.      Musculoskeletal Exam: C-spine, thoracic spine, and lumbar spine good ROM.  Shoulder joints, elbow joints, wrist joints, MCPs, PIPs, and DIPs good ROM with no synovitis.  Hip joints, knee joints, ankle joints, MTPs, PIPs, and DIPs good ROM with no synovitis.  No warmth or effusion of knee joints. No tenderness or inflammation of ankle joints.  CDAI Exam: CDAI Score: -- Patient Global: --; Provider Global: -- Swollen: --; Tender: -- Joint Exam 08/24/2019   No joint exam has been documented for this visit   There is currently no information documented on the homunculus. Go to the Rheumatology activity and complete the homunculus joint exam.  Investigation: No additional findings.  Imaging: No results found.  Recent Labs: Lab Results  Component Value Date   WBC 7.7 07/29/2019   HGB 13.3 07/29/2019   PLT 205 07/29/2019   NA 136 07/29/2019   K 3.9 07/29/2019   CL 102 07/29/2019   CO2 25 07/29/2019  GLUCOSE 84 07/29/2019   BUN 10 07/29/2019   CREATININE 0.86 07/29/2019   BILITOT 0.4 07/29/2019   ALKPHOS 131 (H) 09/11/2013   AST 15 07/29/2019   ALT 13 07/29/2019   PROT 6.2 07/29/2019   ALBUMIN 2.7 (L) 09/11/2013   CALCIUM 8.6 07/29/2019   GFRAA 98 07/29/2019   QFTBGOLDPLUS NEGATIVE 03/24/2019    Speciality Comments: PLQ Eye Exam: 01/29/2019 normal @ [facility], Dr. Teodoro Spray.  Procedures:  No procedures performed Allergies: Cefdinir and Ciprofloxacin       Assessment / Plan:     Visit Diagnoses: Other systemic lupus erythematosus with other organ involvement (HCC) - ANA positive, dsDNA 48, rest of the ENA negative, C3-C4 normal.  History of fatigue, hair loss, arthralgias, oral ulcers, sicca symptoms and photosensitivity:She has been experiencing increased fatigue recently.  She has been sleeping well and has not been under increased stress recently.  We will check vitamin D, sed rate, double-stranded DNA,  complements, and CBC today.  She has not had any other signs or symptoms of a systemic lupus flare.She has not had any discoid lesions recently.  She has clinically been doing well on Plaquenil 200 mg 1 tablet by mouth twice daily Monday through Friday.  She has not had any recent rashes but continues to have ongoing photosensitivity and wears sunscreen on a daily basis.  She also has persistent hair thinning which started several years ago.  She has not had any symptoms of Raynaud's recently.  No digital ulcerations or signs of gangrene.  She has not had any increase shortness of breath or palpitations.  Her lungs were clear to auscultation.  She continues to have chronic sicca symptoms which have been tolerable.  She has not had any recent oral or nasal ulcerations.  Lab work obtained on 03/24/2019 was reviewed today in the office.  We will recheck autoimmune lab work today.  She will continue taking Plaquenil 200 mg 1 tablet by mouth twice daily Monday through Friday.  She was advised to notify us if she develops any new or worsening symptoms.  She will follow-up in the office in 5 months.- Plan: COMPLETE METABOLIC PANEL WITH GFR, Urinalysis, Routine w reflex microscopic, CBC with Differential/Platelet, Anti-DNA antibody, double-stranded, C3 and C4, Sedimentation rate  High risk medication use - Plaquenil 200 mg 1 tablet by mouth twice daily Monday through Friday only.  Eye Exam: 01/29/2019. CBC and CMP were drawn on 07/29/19.  We will recheck CBC and CMP today.   - Plan: COMPLETE METABOLIC PANEL WITH GFR, CBC with Differential/Platelet She has not received the COVID-19 vaccinations yet.  She was encouraged to receive the vaccines as soon as possible.  We discussed that if she develops the COVID-19 infection she is to notify us or her PCP to receive the antibody infusion.  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 She was encouraged to wear sunscreen on a daily basis, SPF >50 and reapply every  2 hours. She was encouraged to avoid direct sun exposure and wear sun protective clothing and a wide brim hat.    Other fatigue -She has been experiencing increased fatigue.  We will check dsDNA, complements, ESR, CBC and vitamin D today.  Plan: Sedimentation rate, VITAMIN D 25 Hydroxy (Vit-D Deficiency, Fractures)  Trapezius muscle spasm: She has not been experiencing muscle spasms recently.   Other form of scoliosis of lumbar spine:  She is not having any lower back pain at this time.   Vitamin D  deficiency -She is taking a daily OTC vitamin D supplement.  She has a history of vitamin D deficiency.  She has been experiencing increased fatigue recently. We will check vitamin D today.  Plan: VITAMIN D 25 Hydroxy (Vit-D Deficiency, Fractures)  Other medical conditions are listed as follows:   History of gastroesophageal reflux (GERD)  History of IBS - She has been evaluated by Dr. Collene Mares and was started on probiotics which helped improve her symptoms considerably  History of anxiety  Hiatal hernia  Orders: Orders Placed This Encounter  Procedures  . COMPLETE METABOLIC PANEL WITH GFR  . Urinalysis, Routine w reflex microscopic  . CBC with Differential/Platelet  . Anti-DNA antibody, double-stranded  . C3 and C4  . Sedimentation rate  . VITAMIN D 25 Hydroxy (Vit-D Deficiency, Fractures)   No orders of the defined types were placed in this encounter.    Follow-Up Instructions: Return in about 5 months (around 01/24/2020) for Autoimmune Disease.   Bo Merino, MD   Scribed by-  Hazel Sams, PA-C  Note - This record has been created using Dragon software.  Chart creation errors have been sought, but may not always  have been located. Such creation errors do not reflect on  the standard of medical care.

## 2019-08-24 ENCOUNTER — Ambulatory Visit: Payer: 59 | Admitting: Rheumatology

## 2019-08-24 ENCOUNTER — Encounter: Payer: Self-pay | Admitting: Physician Assistant

## 2019-08-24 ENCOUNTER — Other Ambulatory Visit: Payer: Self-pay

## 2019-08-24 VITALS — BP 118/82 | HR 82 | Resp 14 | Ht 65.0 in | Wt 173.2 lb

## 2019-08-24 DIAGNOSIS — Z8659 Personal history of other mental and behavioral disorders: Secondary | ICD-10-CM

## 2019-08-24 DIAGNOSIS — L930 Discoid lupus erythematosus: Secondary | ICD-10-CM

## 2019-08-24 DIAGNOSIS — M62838 Other muscle spasm: Secondary | ICD-10-CM

## 2019-08-24 DIAGNOSIS — M359 Systemic involvement of connective tissue, unspecified: Secondary | ICD-10-CM

## 2019-08-24 DIAGNOSIS — R61 Generalized hyperhidrosis: Secondary | ICD-10-CM

## 2019-08-24 DIAGNOSIS — M3219 Other organ or system involvement in systemic lupus erythematosus: Secondary | ICD-10-CM | POA: Diagnosis not present

## 2019-08-24 DIAGNOSIS — E559 Vitamin D deficiency, unspecified: Secondary | ICD-10-CM

## 2019-08-24 DIAGNOSIS — R5383 Other fatigue: Secondary | ICD-10-CM

## 2019-08-24 DIAGNOSIS — Z79899 Other long term (current) drug therapy: Secondary | ICD-10-CM | POA: Diagnosis not present

## 2019-08-24 DIAGNOSIS — Z8719 Personal history of other diseases of the digestive system: Secondary | ICD-10-CM

## 2019-08-24 DIAGNOSIS — M4186 Other forms of scoliosis, lumbar region: Secondary | ICD-10-CM

## 2019-08-24 DIAGNOSIS — K449 Diaphragmatic hernia without obstruction or gangrene: Secondary | ICD-10-CM

## 2019-08-24 NOTE — Patient Instructions (Signed)
Vaccines You are taking a medication(s) that can suppress your immune system.  The following immunizations are recommended: . Flu annually . Covid-19  o Please continue to wear your mask if you are on any medications that suppress your immune system o If you are on methotrexate, Cellcept (mycophenolate), Rinvoq, Morrie Sheldon, and Olumiant - Hold medication for 1 week after each vaccine dose o If you are on Orencia Subcutaneous injections - Hold medication both one week prior to and one week after the first vaccine dose (only) o If you are on Orencia IV infusions - Time vaccine administration so that the first vaccination will occur four weeks after infusion . Pneumonia (Pneumovax 23 and Prevnar 13 spaced at least 1 year apart) . Shingrix (after age 58)  Please check with your PCP to make sure you are up to date.   COVID-19 vaccine recommendations:   COVID-19 vaccine is recommended for everyone (unless you are allergic to a vaccine component), even if you are on a medication that suppresses your immune system.   If you are on Methotrexate, Cellcept (mycophenolate), Rinvoq, Morrie Sheldon, and Olumiant- hold the medication for 1 week after each vaccine. Hold Methotrexate for 2 weeks after the single dose COVID-19 vaccine.   If you are on Orencia subcutaneous injection - hold medication one week prior to and one week after the first COVID-19 vaccine dose (only).   If you are on Orencia IV infusions- time vaccination administration so that the first COVID-19 vaccination will occur four weeks after the infusion and postpone the subsequent infusion by one week.   If you are on Cyclophosphamide or Rituxan infusions please contact your doctor prior to receiving the COVID-19 vaccine.   Do not take Tylenol or ant anti-inflammatory medications (NSAIDs) 24 hours prior to the COVID-19 vaccination.   There is no direct evidence about the efficacy of the COVID-19 vaccine in individuals who are on medications  that suppress the immune system.   Even if you are fully vaccinated, and you are on any medications that suppress your immune system, please continue to wear a mask, maintain at least six feet social distance and practice hand hygiene.   If you develop a COVID-19 infection, please contact your PCP or our office to determine if you need antibody infusion.  We anticipate that a booster vaccine will be available soon for immunosuppressed individuals. Please call our office before receiving your booster dose to make adjustments to your medication regimen.   [email or web address]

## 2019-08-25 LAB — CBC WITH DIFFERENTIAL/PLATELET
Absolute Monocytes: 446 cells/uL (ref 200–950)
Basophils Absolute: 51 cells/uL (ref 0–200)
Basophils Relative: 1.1 %
Eosinophils Absolute: 386 cells/uL (ref 15–500)
Eosinophils Relative: 8.4 %
HCT: 41.5 % (ref 35.0–45.0)
Hemoglobin: 13.3 g/dL (ref 11.7–15.5)
Lymphs Abs: 1380 cells/uL (ref 850–3900)
MCH: 27.7 pg (ref 27.0–33.0)
MCHC: 32 g/dL (ref 32.0–36.0)
MCV: 86.5 fL (ref 80.0–100.0)
MPV: 10.9 fL (ref 7.5–12.5)
Monocytes Relative: 9.7 %
Neutro Abs: 2337 cells/uL (ref 1500–7800)
Neutrophils Relative %: 50.8 %
Platelets: 221 10*3/uL (ref 140–400)
RBC: 4.8 10*6/uL (ref 3.80–5.10)
RDW: 12.9 % (ref 11.0–15.0)
Total Lymphocyte: 30 %
WBC: 4.6 10*3/uL (ref 3.8–10.8)

## 2019-08-25 LAB — COMPLETE METABOLIC PANEL WITH GFR
AG Ratio: 1.6 (calc) (ref 1.0–2.5)
ALT: 9 U/L (ref 6–29)
AST: 13 U/L (ref 10–30)
Albumin: 3.9 g/dL (ref 3.6–5.1)
Alkaline phosphatase (APISO): 53 U/L (ref 31–125)
BUN: 11 mg/dL (ref 7–25)
CO2: 28 mmol/L (ref 20–32)
Calcium: 8.7 mg/dL (ref 8.6–10.2)
Chloride: 104 mmol/L (ref 98–110)
Creat: 0.85 mg/dL (ref 0.50–1.10)
GFR, Est African American: 99 mL/min/{1.73_m2} (ref >=60)
GFR, Est Non African American: 86 mL/min/{1.73_m2} (ref >=60)
Globulin: 2.4 g/dL (calc) (ref 1.9–3.7)
Glucose, Bld: 83 mg/dL (ref 65–99)
Potassium: 3.9 mmol/L (ref 3.5–5.3)
Sodium: 137 mmol/L (ref 135–146)
Total Bilirubin: 0.5 mg/dL (ref 0.2–1.2)
Total Protein: 6.3 g/dL (ref 6.1–8.1)

## 2019-08-25 LAB — C3 AND C4
C3 Complement: 111 mg/dL (ref 83–193)
C4 Complement: 35 mg/dL (ref 15–57)

## 2019-08-25 LAB — URINALYSIS, ROUTINE W REFLEX MICROSCOPIC
Bilirubin Urine: NEGATIVE
Glucose, UA: NEGATIVE
Hgb urine dipstick: NEGATIVE
Ketones, ur: NEGATIVE
Leukocytes,Ua: NEGATIVE
Nitrite: NEGATIVE
Protein, ur: NEGATIVE
Specific Gravity, Urine: 1.014 (ref 1.001–1.03)
pH: 5.5 (ref 5.0–8.0)

## 2019-08-25 LAB — VITAMIN D 25 HYDROXY (VIT D DEFICIENCY, FRACTURES): Vit D, 25-Hydroxy: 45 ng/mL (ref 30–100)

## 2019-08-25 LAB — SEDIMENTATION RATE: Sed Rate: 2 mm/h (ref 0–20)

## 2019-08-25 LAB — ANTI-DNA ANTIBODY, DOUBLE-STRANDED: ds DNA Ab: 21 IU/mL — ABNORMAL HIGH

## 2019-08-25 NOTE — Progress Notes (Signed)
CBC and CMP WNL.  ESR WNL.  Vitamin D is within the desirable range.  UA normal.

## 2019-08-25 NOTE — Progress Notes (Signed)
DsDNA is positive but trending down.  Complements WNL. Lab work is not consistent with a flare.  No change in therapy recommended.

## 2019-09-03 ENCOUNTER — Ambulatory Visit: Payer: 59 | Attending: Internal Medicine

## 2019-09-03 DIAGNOSIS — Z23 Encounter for immunization: Secondary | ICD-10-CM

## 2019-09-03 NOTE — Progress Notes (Signed)
   Covid-19 Vaccination Clinic  Name:  Floriene Jeschke    MRN: [id number] DOB: [birth year]  09/03/2019  Ms. Weaver was observed post Covid-19 immunization for 15 minutes without incident. She was provided with Vaccine Information Sheet and instruction to access the V-Safe system.   Ms. Gillham was instructed to call 911 with any severe reactions post vaccine: Marland Kitchen Difficulty breathing  . Swelling of face and throat  . A fast heartbeat  . A bad rash all over body  . Dizziness and weakness   Immunizations Administered    Name Date Dose VIS Date Route   Pfizer COVID-19 Vaccine 09/03/2019 10:02 AM 0.3 mL 03/04/2018 Intramuscular   Manufacturer: [employer], [employer]   Lot: [id number]   [place]: [id number]

## 2019-09-08 ENCOUNTER — Other Ambulatory Visit: Payer: Self-pay | Admitting: Rheumatology

## 2019-09-08 DIAGNOSIS — M359 Systemic involvement of connective tissue, unspecified: Secondary | ICD-10-CM

## 2019-09-08 MED ORDER — HYDROXYCHLOROQUINE SULFATE 200 MG PO TABS: ORAL_TABLET | ORAL | 0 refills | Status: DC

## 2019-09-08 NOTE — Telephone Encounter (Signed)
Last Visit: 08/24/2019 Next Visit: 01/26/2020 Labs: 08/24/2019 CBC and CMP WNL.  Eye exam: 01/29/2019 normal  Current Dose per office note 08/24/2019: Plaquenil 200 mg 1 tabletby mouthtwice daily Monday through Friday only. DX:Other systemic lupus erythematosus with other organ involvement   Okay to refill per Dr. Deveshwar  

## 2019-09-20 IMAGING — DX DG CHEST 2V
2 series · 2 of 2 positions shown · non-contrast
Comparison: March 23, 2017

CLINICAL DATA: Shortness of breath.  Systemic lupus erythematosus

EXAM:
CHEST - 2 VIEW

[chest pa]
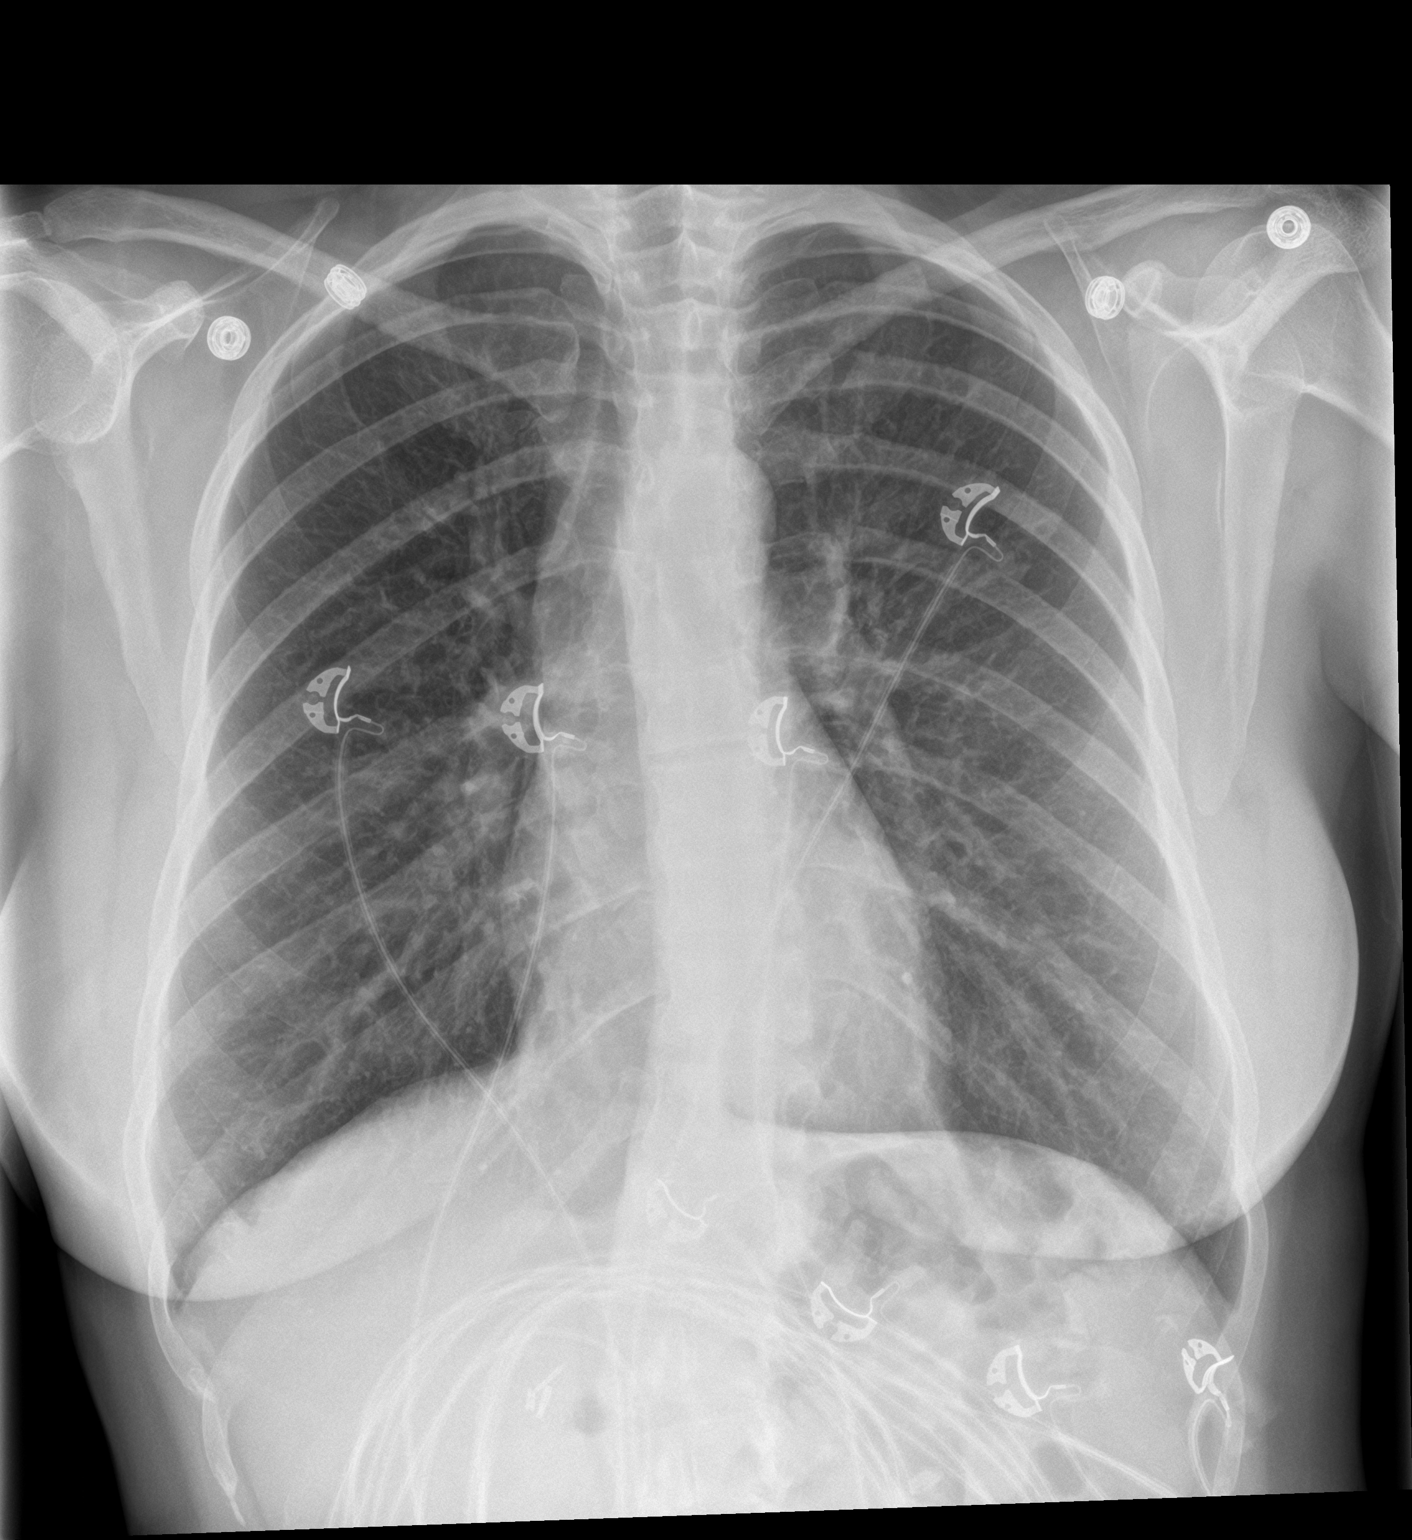

[chest lat]
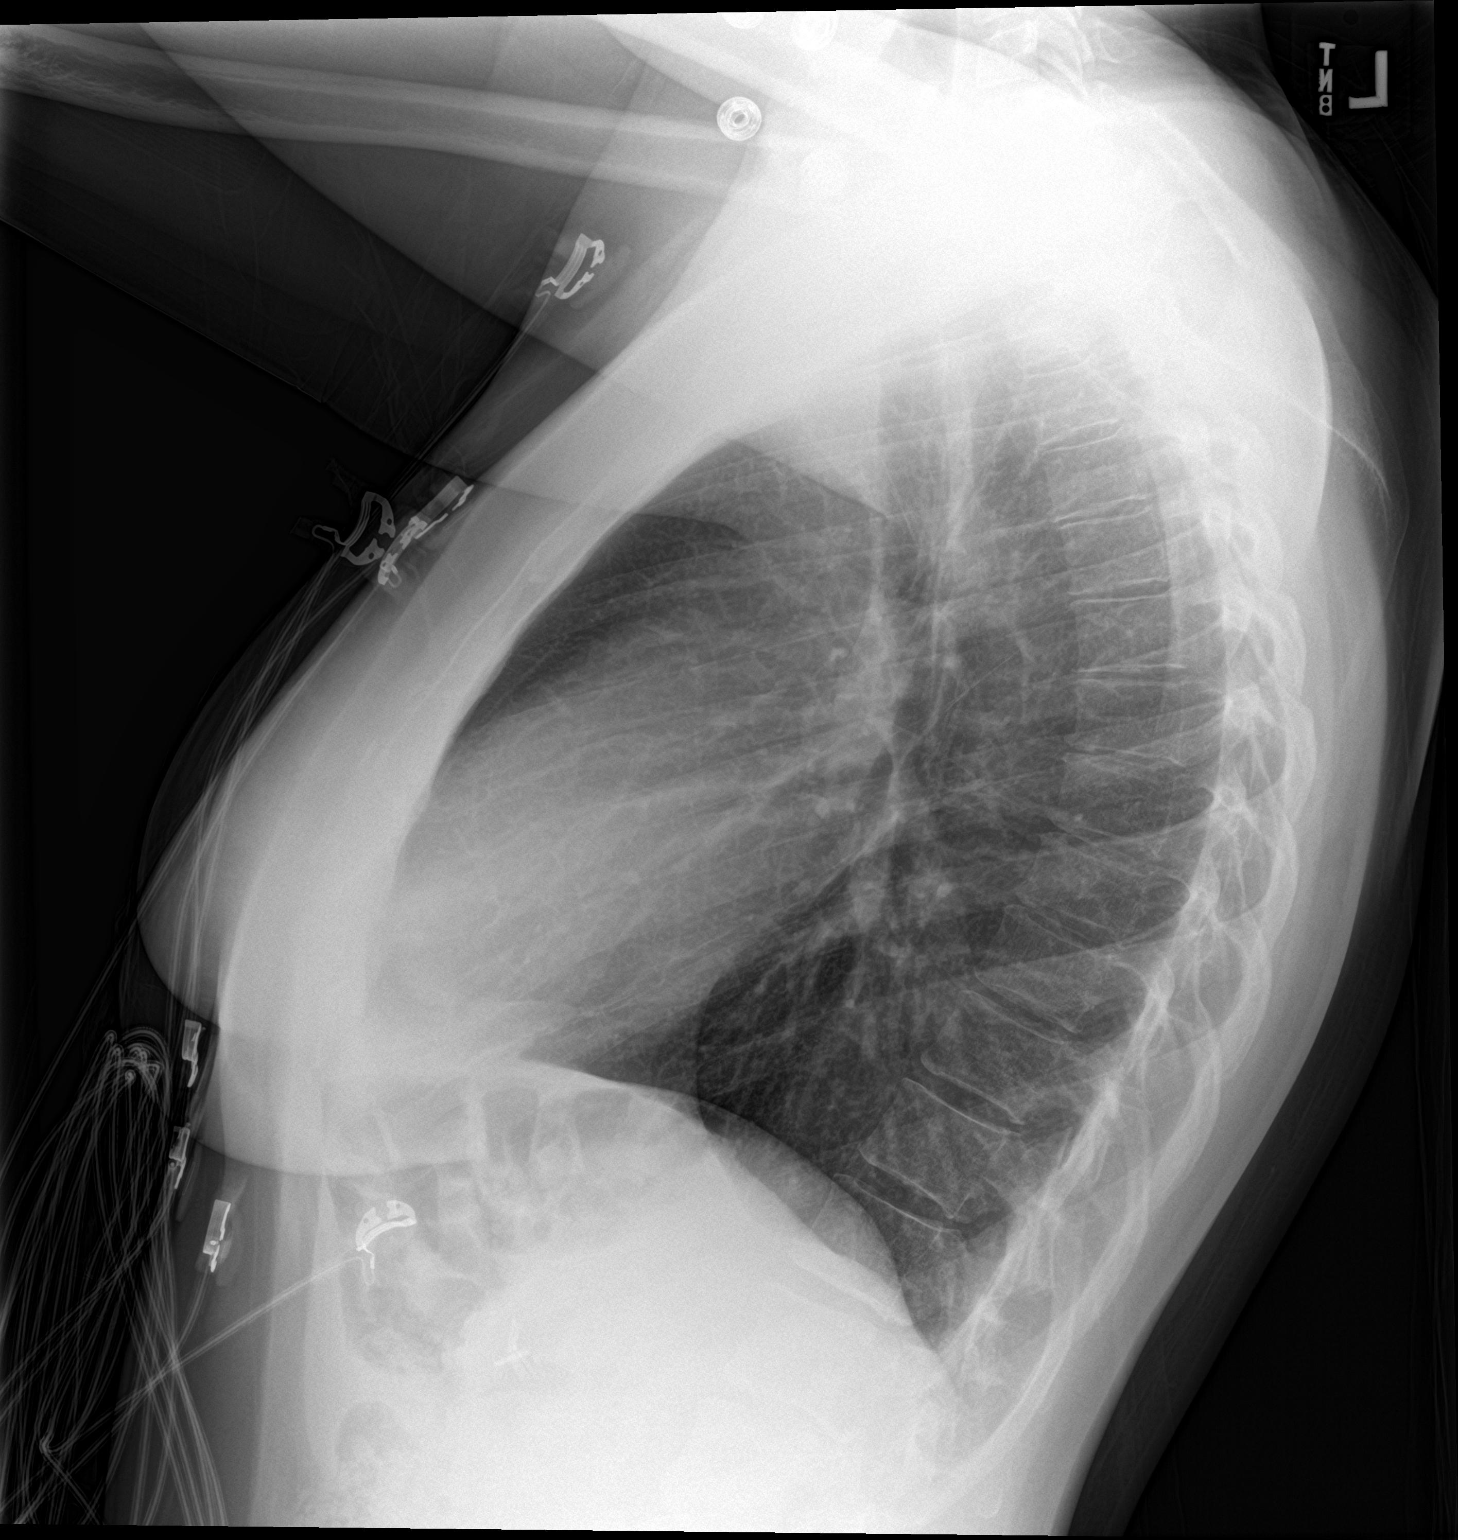

[2 of 2 positions shown; findings below may reference images not displayed]

FINDINGS: No edema or consolidation. Heart size and pulmonary vascularity are
normal. No adenopathy. There is lower thoracic levoscoliosis.
IMPRESSION: No edema or consolidation.  No evident adenopathy.

## 2019-09-20 IMAGING — CT CT ANGIO CHEST
2 of 6 series · 19 of 46 positions shown · IV contrast (Isovue)
Comparison: Chest radiograph October 02, 2017

CLINICAL DATA: Shortness of breath

EXAM:
CT ANGIOGRAPHY CHEST WITH CONTRAST
TECHNIQUE: Multidetector CT imaging of the chest was performed using the
standard protocol during bolus administration of intravenous
contrast. Multiplanar CT image reconstructions and MIPs were
obtained to evaluate the vascular anatomy.
CONTRAST:  100mL [id number] IOPAMIDOL ([id number]) INJECTION 76%

[Series 6: thins · axial · 0.62mm/px · z∈[+839,+1073]mm · 16 of 258 slices shown]
[im 12/258  lung]
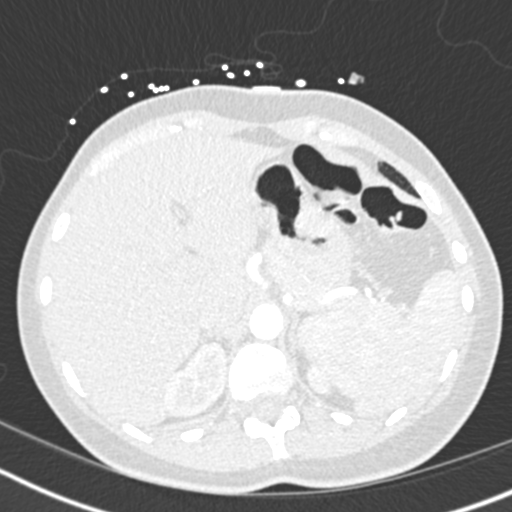
[im 34/258  soft-tissue]
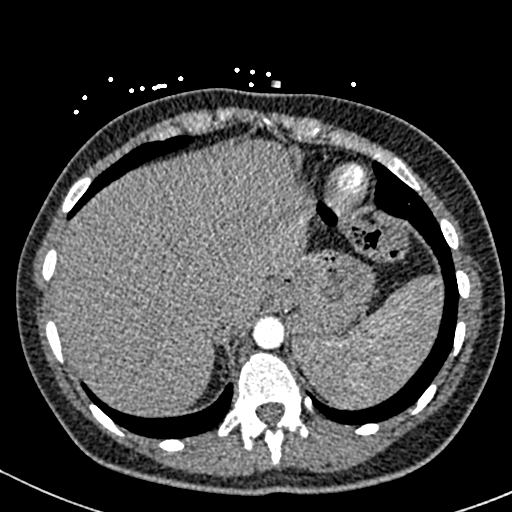
[im 45/258  lung]
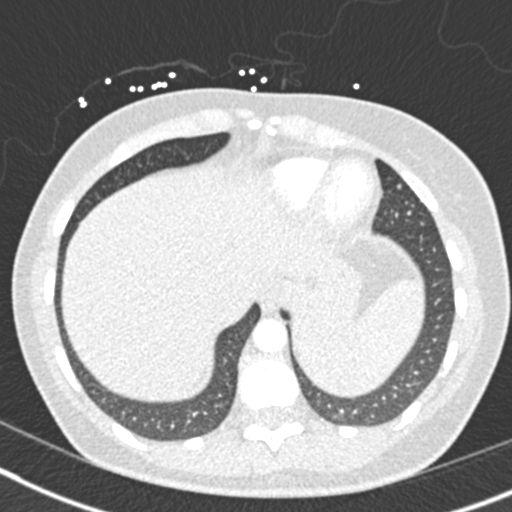
[im 56/258  soft-tissue]
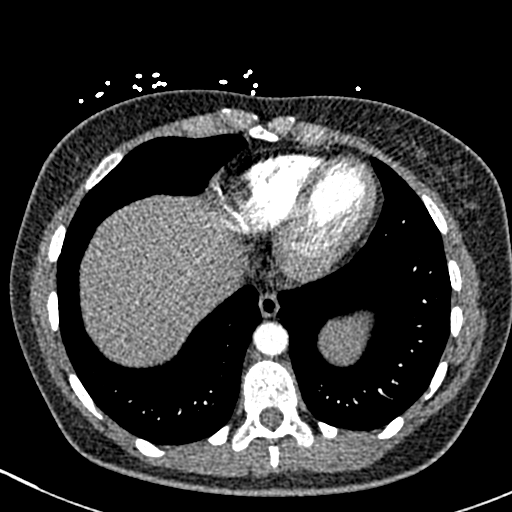
[im 79/258  lung]
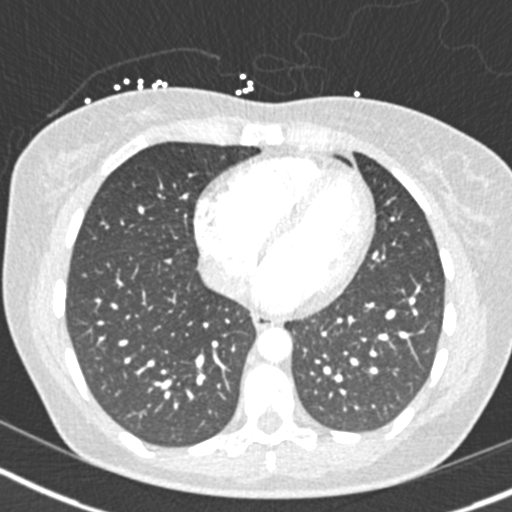
[im 90/258  soft-tissue]
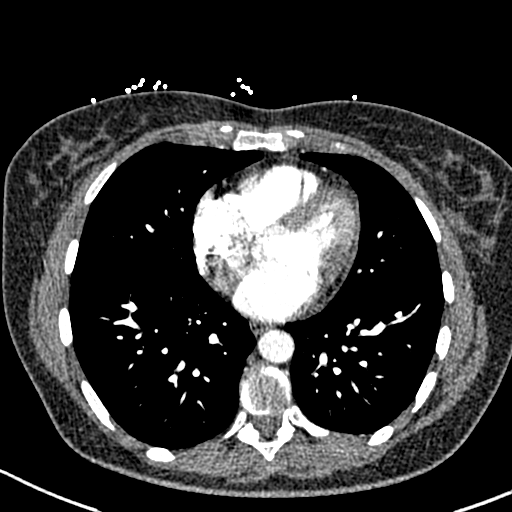
[im 101/258  lung]
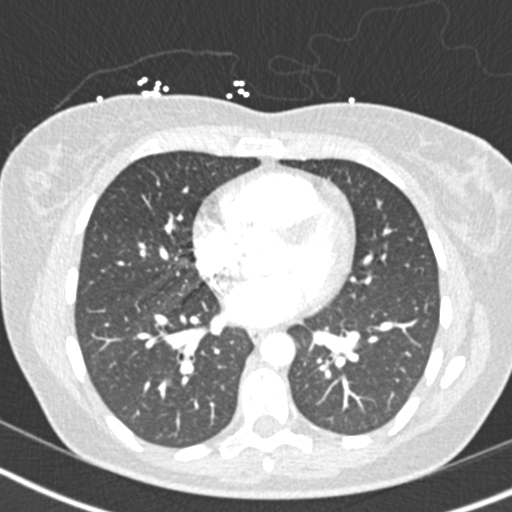
[im 123/258  soft-tissue]
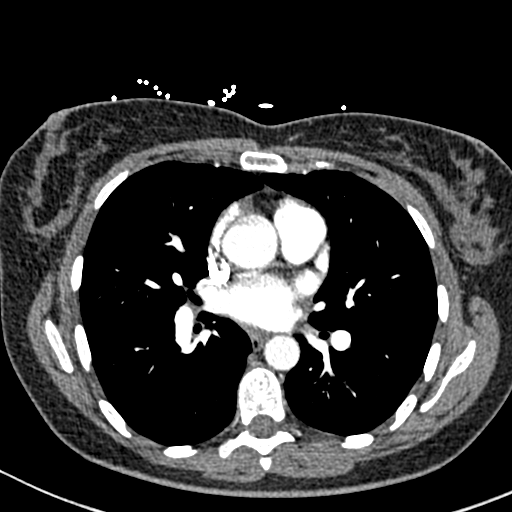
[im 135/258  lung]
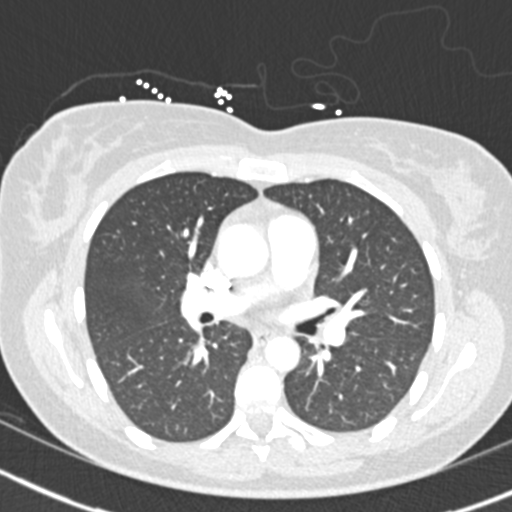
[im 157/258  soft-tissue]
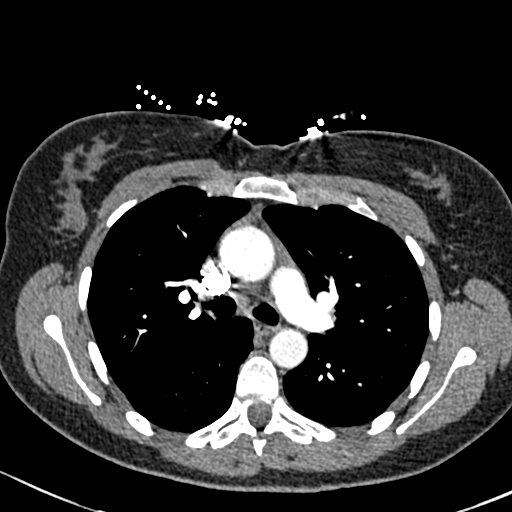
[im 168/258  lung]
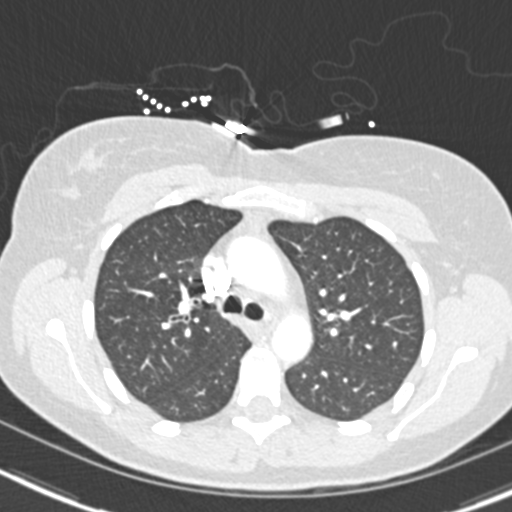
[im 179/258  soft-tissue]
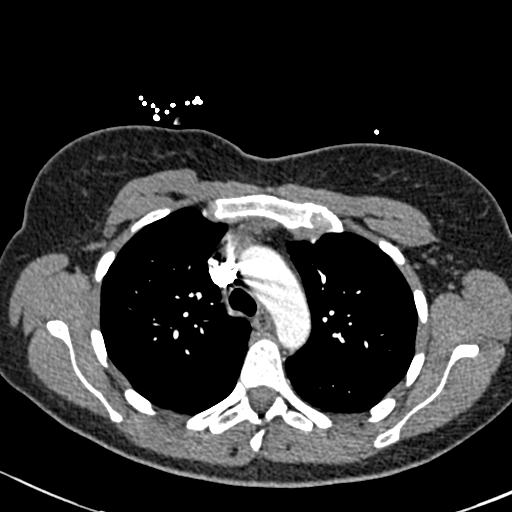
[im 202/258  lung]
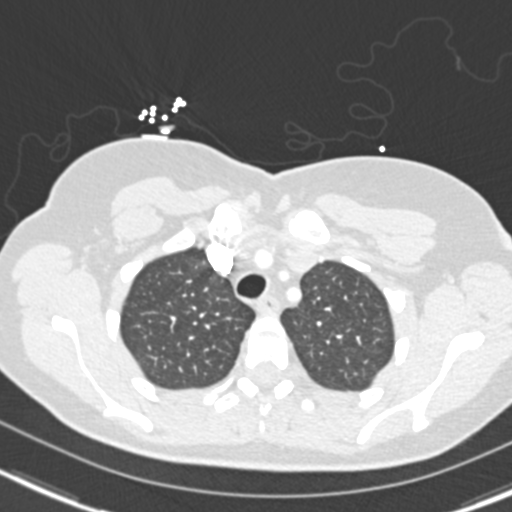
[im 213/258  soft-tissue]
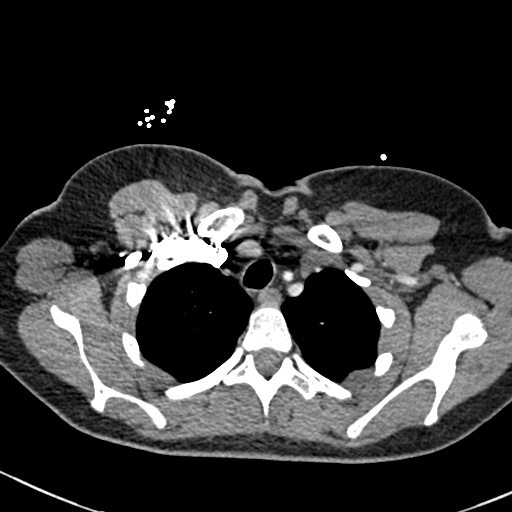
[im 224/258  lung]
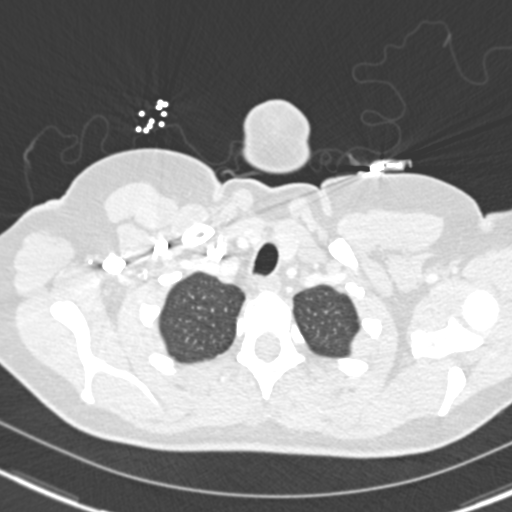
[im 246/258  soft-tissue]
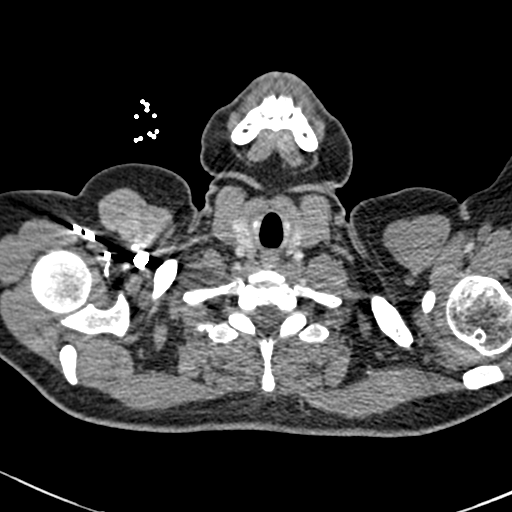

[Series 8: coronal mpr · coronal · 0.54mm/px · 3 of 128 slices shown]
[im 32/128  soft-tissue]
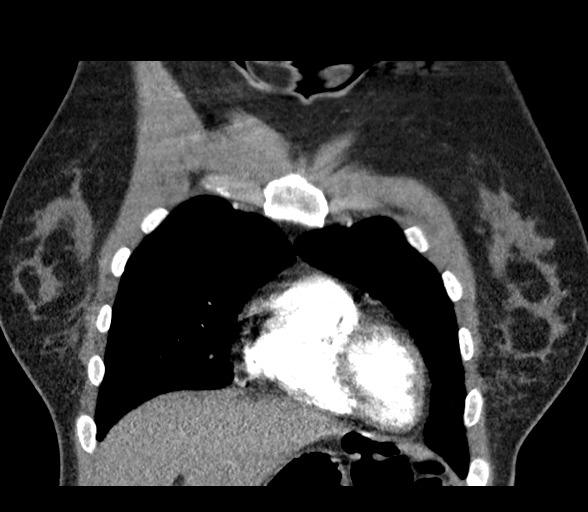
[im 64/128  soft-tissue]
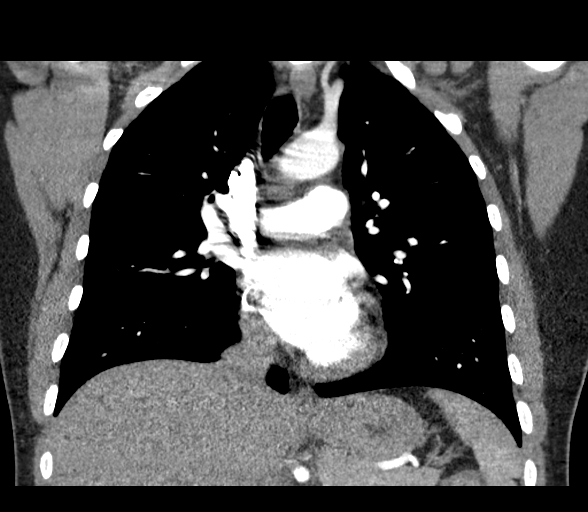
[im 96/128  soft-tissue]
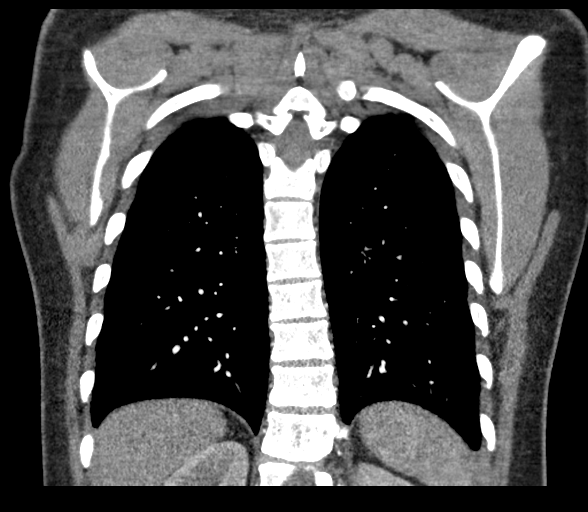

[19 of 46 positions shown; findings below may reference images not displayed]

FINDINGS: Cardiovascular: There is no demonstrable pulmonary embolus. There is
no thoracic aortic aneurysm or dissection. There is no evident
pericardial effusion or pericardial thickening.

Mediastinum/Nodes: Thyroid appears unremarkable. There is no
thoracic adenopathy. No esophageal lesions are evident.

Lungs/Pleura: There is no edema or consolidation. No pleural
effusion or pleural thickening evident. There is mild atelectatic
change in the right upper lobe.

Upper Abdomen: Gallbladder is absent. Upper abdominal structures
otherwise appear unremarkable.

Musculoskeletal: There is mild midthoracic dextroscoliosis with
lower thoracic levoscoliosis. There are no blastic or lytic bone
lesions. No chest wall lesions evident.

Review of the MIP images confirms the above findings.
IMPRESSION: 1. No demonstrable pulmonary embolus. No thoracic aortic aneurysm or
dissection.

2.  Slight right upper lobe atelectasis.  No edema or consolidation.

3.  No evident adenopathy.

4.  Gallbladder absent.

## 2019-09-24 ENCOUNTER — Ambulatory Visit: Payer: 59 | Attending: Internal Medicine

## 2019-09-24 DIAGNOSIS — Z23 Encounter for immunization: Secondary | ICD-10-CM

## 2019-09-24 NOTE — Progress Notes (Signed)
   Covid-19 Vaccination Clinic  Name:  Emma Franco    MRN: [id number] DOB: 09-30-1980  09/24/2019  [name] was observed post Covid-19 immunization for 15 minutes without incident. She was provided with Vaccine Information Sheet and instruction to access the V-Safe system.   [name] was instructed to call 911 with any severe reactions post vaccine: Marland Kitchen Difficulty breathing  . Swelling of face and throat  . A fast heartbeat  . A bad rash all over body  . Dizziness and weakness   Immunizations Administered    Name Date Dose VIS Date Route   Pfizer COVID-19 Vaccine 09/24/2019  9:22 AM 0.3 mL 03/04/2018 Intramuscular   Manufacturer: [employer], [employer]   Lot: [place]: [id number]

## 2019-11-17 ENCOUNTER — Ambulatory Visit (INDEPENDENT_AMBULATORY_CARE_PROVIDER_SITE_OTHER): Payer: 59 | Admitting: Family Medicine

## 2019-11-17 ENCOUNTER — Other Ambulatory Visit: Payer: Self-pay

## 2019-11-17 ENCOUNTER — Encounter: Payer: Self-pay | Admitting: Family Medicine

## 2019-11-17 VITALS — BP 135/89 | HR 88 | Temp 98.0°F | Ht 65.0 in | Wt 173.0 lb

## 2019-11-17 DIAGNOSIS — F3342 Major depressive disorder, recurrent, in full remission: Secondary | ICD-10-CM

## 2019-11-17 DIAGNOSIS — M328 Other forms of systemic lupus erythematosus: Secondary | ICD-10-CM

## 2019-11-17 DIAGNOSIS — M4186 Other forms of scoliosis, lumbar region: Secondary | ICD-10-CM | POA: Diagnosis not present

## 2019-11-17 NOTE — Patient Instructions (Signed)
  HAPPY FALL!  I appreciate the opportunity to provide you with care for your health and wellness. Today we discussed: established care  Follow up: July 2022 for CPE -fasting labs same day   No labs or referrals today  Nice to meet you today!  Congrats on your new home.  Have a great Holiday Season!   Please continue to practice social distancing to keep you, your family, and our community safe.  If you must go out, please wear a mask and practice good handwashing.  It was a pleasure to see you and I look forward to continuing to work together on your health and well-being. Please do not hesitate to call the office if you need care or have questions about your care.  Have a wonderful day and week. With Gratitude, Cherly Beach, DNP, AGNP-BC

## 2019-11-17 NOTE — Progress Notes (Signed)
Subjective:  Patient ID: Emma Franco, female    DOB: 11-13-1980  Age: 39 y.o. MRN: [id number]  CC:  Chief Complaint  Patient presents with  . New Patient (Initial Visit)      HPI  HPI Emma Franco is a 39 year old female patient who presents today to establish care. She has a history as detailed below.  She reports that she is on Wellbutrin and Lexapro and thinks that everything is doing okay at this time.  She has had several medications that she is used without much success.  We will continue these medications at this time.  She is seen by rheumatology secondary to lupus.  Is on Plaquenil 5 days a week.  She sees Dr. Sabra Heck for eye appointments secondary to being on Plaquenil.  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  She does have allergies and takes Flonase and Zyrtec as needed for this.  She sees Dr. Valentino Saxon in [place] for her GYN needs.  She reports that she does not sleep very well.  She has a lot of tightness in her back secondary to scoliosis.  She reports this causes some insomnia-like issues.  She reports that she is constantly trying to get comfortable.  She reports doing well with chewing and swallowing no appetite issues.  She denies having any changes in bowel or bladder habits.  Denies having any memory issues.  Reports that sometimes she feels off with her energy level but most likely that is due to her lupus.  She denies having any skin, hearing or vision changes.  She denies having any active chest pain, recent migraines, cough, shortness of breath, recent sick contacts, sinus issues outside of stated above, or any other signs or symptoms need to be discussed today.  Today patient denies signs and symptoms of COVID 19 infection including fever, chills, cough, shortness of breath, and headache. Past Medical, Surgical, Social History, Allergies, and Medications have  been Reviewed.   Past Medical History:  Diagnosis Date  . Allergy   . Anxiety   . 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 Chronic sinusitis 05/21/2017   s/p surgery, also complicated by CSF leak, which was repaired   . Environmental allergies   . Esophageal reflux   . ESOPHAGITIS 03/18/2008   Qualifier: Diagnosis of  By: Nils Pyle CMA ([place]), Mearl Latin    . Family history of rheumatoid arthritis 05/21/2017   Mother  . Headache(784.0) 07/18/11   "qd for the last year; CSF leak; repaired today"  . Hiatal hernia   . History of miscarriage 06/26/2017   In first trimester  . Irritable bowel syndrome   . Lupus ([place])   . Migraines    "once in a blue moon"  . PONV (postoperative nausea and vomiting)   . Postpartum care following cesarean delivery (10/9) 10/16/2013  . Previous cesarean delivery, delivered 10/19/2013  . Scoliosis    mild  . Sinusitis, chronic     Current Meds  Medication Sig  . buPROPion (WELLBUTRIN SR) 200 MG 12 hr tablet Take 1 tablet (200 mg total) by mouth 2 (two) times daily.  . cetirizine (ZYRTEC) 10 MG tablet Take 10 mg by mouth daily.  Marland Kitchen escitalopram (LEXAPRO) 20 MG tablet Take 1 tablet (20 mg total) by mouth daily.  . fluticasone (FLONASE) 50 MCG/ACT nasal spray Place 1 spray into both nostrils as needed.  Marland Kitchen  hydroxychloroquine (PLAQUENIL) 200 MG tablet Take 1 tablet by mouth twice daily Monday through Friday only.  Marland Kitchen VITAMIN D PO Take by mouth daily.    ROS:  Review of Systems  Constitutional: Negative.        See HPI for more details  HENT: Negative.   Eyes: Negative.   Respiratory: Negative.   Cardiovascular: Negative.   Gastrointestinal: Negative.   Genitourinary: Negative.   Musculoskeletal: Negative.   Skin: Negative.   Neurological: Negative.   Endo/Heme/Allergies: Negative.   Psychiatric/Behavioral: Negative.        Objective:   Today's Vitals: BP 135/89 (BP Location: Right Arm, Patient Position: Sitting, Cuff Size: Normal)   Pulse 88   Temp 98 F  (36.7 C) (Tympanic)   Ht 5\' 5"  (1.651 m)   Wt 173 lb (78.5 kg)   LMP 11/14/2019   SpO2 99%   BMI 28.79 kg/m  Vitals with BMI 11/17/2019 08/24/2019 07/30/2019  Height 5\' 5"  5\' 5"  5\' 5"   Weight 173 lbs 173 lbs 3 oz 170 lbs  BMI 28.79 53.66 44.03  Systolic 474 259 563  Diastolic 89 82 76  Pulse 88 82 109     Physical Exam Vitals and nursing note reviewed.  Constitutional:      Appearance: Normal appearance. She is well-developed, well-groomed and overweight.  HENT:     Head: Normocephalic and atraumatic.     Right Ear: External ear normal.     Left Ear: External ear normal.     Mouth/Throat:     Comments: Mask in place Eyes:     General:        Right eye: No discharge.        Left eye: No discharge.     Conjunctiva/sclera: Conjunctivae normal.  Cardiovascular:     Rate and Rhythm: Normal rate and regular rhythm.     Pulses: Normal pulses.     Heart sounds: Normal heart sounds.  Pulmonary:     Effort: Pulmonary effort is normal.     Breath sounds: Normal breath sounds.  Musculoskeletal:        General: Normal range of motion.     Cervical back: Normal range of motion and neck supple.  Skin:    General: Skin is warm.  Neurological:     General: No focal deficit present.     Mental Status: She is alert and oriented to person, place, and time.  Psychiatric:        Attention and Perception: Attention normal.        Mood and Affect: Mood and affect normal.        Speech: Speech normal.        Behavior: Behavior normal. Behavior is cooperative.        Thought Content: Thought content normal.        Cognition and Memory: Cognition and memory normal.        Judgment: Judgment normal.     Comments: Good communication, good eye contact    Depression screen [facility] 2/9 11/17/2019 11/17/2019 03/05/2019  Decreased Interest 0 0 0  Down, Depressed, Hopeless 0 0 0  PHQ - 2 Score 0 0 0     Assessment   1. Other forms of systemic lupus erythematosus, unspecified organ involvement  status ([place])   2. Recurrent major depressive disorder, in full remission ([place])   3. Other form of scoliosis of lumbar spine     Tests ordered No orders of the defined types were placed in  this encounter.    Plan: Please see assessment and plan per problem list above.   No orders of the defined types were placed in this encounter.   Patient to follow-up in July 2022  Note: This dictation was prepared with Dragon dictation along with smaller phrase technology. Similar sounding words can be transcribed inadequately or may not be corrected upon review. Any transcriptional errors that result from this process are unintentional.      Perlie Mayo, NP

## 2019-11-19 ENCOUNTER — Encounter: Payer: Self-pay | Admitting: Family Medicine

## 2019-11-19 DIAGNOSIS — F3342 Major depressive disorder, recurrent, in full remission: Secondary | ICD-10-CM | POA: Insufficient documentation

## 2019-11-19 NOTE — Assessment & Plan Note (Signed)
Denies have any SI or HI.  Continue Wellbutrin and Lexapro at this time.

## 2019-11-19 NOTE — Assessment & Plan Note (Signed)
Continues to have discomfort from this that interferes with her daily life.  She does massages did question whether or not a muscle relaxer would help.  She was on 1 with Dr. Luan Pulling but has not been on one in some time.

## 2019-11-19 NOTE — Assessment & Plan Note (Signed)
Has lupus is now being treated by rheumatology and is on Plaquenil does see eye care for this as well.  She reports that her symptoms of fatigue and energy loss did not really improve but is willing to take the Plaquenil to help prevent organ damage.

## 2019-11-26 ENCOUNTER — Encounter: Payer: Self-pay | Admitting: Family Medicine

## 2019-11-26 ENCOUNTER — Telehealth (INDEPENDENT_AMBULATORY_CARE_PROVIDER_SITE_OTHER): Payer: 59 | Admitting: Family Medicine

## 2019-11-26 ENCOUNTER — Other Ambulatory Visit: Payer: Self-pay

## 2019-11-26 VITALS — Temp 98.8°F | Ht 65.0 in | Wt 173.0 lb

## 2019-11-26 DIAGNOSIS — J069 Acute upper respiratory infection, unspecified: Secondary | ICD-10-CM | POA: Diagnosis not present

## 2019-11-26 MED ORDER — PREDNISONE 10 MG (21) PO TBPK: ORAL_TABLET | ORAL | 0 refills | Status: DC

## 2019-11-26 MED ORDER — PROMETHAZINE-DM 6.25-15 MG/5ML PO SYRP: 2.5000 mL | ORAL_SOLUTION | Freq: Three times a day (TID) | ORAL | 0 refills | Status: DC | PRN

## 2019-11-26 NOTE — Patient Instructions (Signed)
°  HAPPY FALL!  I appreciate the opportunity to provide you with care for your health and wellness. Today we discussed: sinus infection symptoms   Follow up: as scheduled  No labs or referrals today  Please continue to practice social distancing to keep you, your family, and our community safe.  If you must go out, please wear a mask and practice good handwashing.  It was a pleasure to see you and I look forward to continuing to work together on your health and well-being. Please do not hesitate to call the office if you need care or have questions about your care.  Have a wonderful day and week. With Gratitude, Cherly Beach, DNP, AGNP-BC

## 2019-11-26 NOTE — Progress Notes (Signed)
Virtual Visit via Telephone Note   This visit type was conducted due to national recommendations for restrictions regarding the COVID-19 Pandemic (e.g. social distancing) in an effort to limit this patient's exposure and mitigate transmission in our community.  Due to her co-morbid illnesses, this patient is at least at moderate risk for complications without adequate follow up.  This format is felt to be most appropriate for this patient at this time.  The patient did not have access to video technology/had technical difficulties with video requiring transitioning to audio format only (telephone).  All issues noted in this document were discussed and addressed.  No physical exam could be performed with this format.    Evaluation Performed:  Follow-up visit  Date:  11/26/2019   ID:  Emma Franco, DOB 03-06-1980, MRN [id number]  Patient Location: Home Provider Location: Office/Clinic  Location of Patient: Home Location of Provider: Telehealth Consent was obtain for visit to be over via telehealth. I verified that I am speaking with the correct person using two identifiers.  PCP:  Perlie Mayo, NP   Chief Complaint:    History of Present Illness:    Emma Franco is a 39 y.o. female with history that is listed below. Presents today for an acute visit due to sinus symptoms. Onset was 4 days ago. Reports covid test was neg. She reports she has had what she thinks is a sinus infection over the last two days her symptoms have increased. . She has forehead pain and pressure-headache. She has a sore throat from coughing and nasal drainage. Drainage from nose is green. No fever or chills noted. She has lupus and reports difficulty over coming colds. Coughing is really keeping her up at night. It is her worse symptom at this time.   The patient does not have symptoms concerning for COVID-19 infection (fever, chills, cough, or new shortness of breath).   Past Medical,  Surgical, Social History, Allergies, and Medications have been Reviewed.  Past Medical History:  Diagnosis Date  . Allergy   . Anxiety   . 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 Chronic sinusitis 05/21/2017   s/p surgery, also complicated by CSF leak, which was repaired   . Environmental allergies   . Esophageal reflux   . ESOPHAGITIS 03/18/2008   Qualifier: Diagnosis of  By: Nils Pyle CMA ([place]), Mearl Latin    . Family history of rheumatoid arthritis 05/21/2017   Mother  . Headache(784.0) 07/18/11   "qd for the last year; CSF leak; repaired today"  . Hiatal hernia   . History of miscarriage 06/26/2017   In first trimester  . Irritable bowel syndrome   . Lupus ([place])   . Migraines    "once in a blue moon"  . PONV (postoperative nausea and vomiting)   . Postpartum care following cesarean delivery (10/9) 10/16/2013  . Previous cesarean delivery, delivered 10/19/2013  . Scoliosis    mild  . Sinusitis, chronic    Past Surgical History:  Procedure Laterality Date  . CESAREAN SECTION  03/2009  . CESAREAN SECTION Bilateral 10/16/2013   Procedure: REPEAT CESAREAN SECTION WITH BILATERAL TUBAL LIGATION ;  Surgeon: Claiborne Billings A. Pamala Hurry, MD;  Location: [facility];  Service: Obstetrics;  Laterality: Bilateral;   EDD: 10/26/13  . CHOLECYSTECTOMY  12/1996  . NASAL SEPTUM SURGERY  2006  . NASAL SINUS SURGERY  03/2010; 07/18/11  . REPAIR DURAL / CSF LEAK  07/18/11  . SINUS ENDO W/FUSION  07/18/2011   Procedure: ENDOSCOPIC SINUS SURGERY WITH FUSION NAVIGATION;  Surgeon: Ruby Cola, MD;  Location: [facility];  Service: ENT;  Laterality: N/A;  . TONSILLECTOMY  1990  . TUBAL LIGATION       Current Meds  Medication Sig  . buPROPion (WELLBUTRIN SR) 200 MG 12 hr tablet Take 1 tablet (200 mg total) by mouth 2 (two) times daily.  . cetirizine (ZYRTEC) 10 MG tablet Take 10 mg by mouth daily.  Marland Kitchen escitalopram (LEXAPRO) 20 MG tablet Take 1 tablet (20 mg total) by mouth daily.  . fluticasone (FLONASE) 50 MCG/ACT nasal spray Place 1  spray into both nostrils as needed.  . hydroxychloroquine (PLAQUENIL) 200 MG tablet Take 1 tablet by mouth twice daily Monday through Friday only.  Marland Kitchen VITAMIN D PO Take by mouth daily.     Allergies:   Cefdinir and Ciprofloxacin   ROS:   Please see the history of present illness.    All other systems reviewed and are negative.   Labs/Other Tests and Data Reviewed:    Recent Labs: 03/24/2019: TSH 4.11 08/24/2019: ALT 9; BUN 11; Creat 0.85; Hemoglobin 13.3; Platelets 221; Potassium 3.9; Sodium 137   Recent Lipid Panel No results found for: CHOL, TRIG, HDL, CHOLHDL, LDLCALC, LDLDIRECT  Wt Readings from Last 3 Encounters:  11/26/19 173 lb (78.5 kg)  11/17/19 173 lb (78.5 kg)  08/24/19 173 lb 3.2 oz (78.6 kg)     Objective:    Vital Signs:  Temp 98.8 F (37.1 C) (Temporal)   Ht 5\' 5"  (1.651 m)   Wt 173 lb (78.5 kg)   LMP 11/14/2019   BMI 28.79 kg/m    VITAL SIGNS:  reviewed GEN:  no acute distress RESPIRATORY:  no shortness of breath in conversation  PSYCH:  normal affect  ASSESSMENT & PLAN:     1. Viral URI with cough   Time:   Today, I have spent 5 minutes with the patient with telehealth technology discussing the above problems.     Medication Adjustments/Labs and Tests Ordered: Current medicines are reviewed at length with the patient today.  Concerns regarding medicines are outlined above.   Tests Ordered: No orders of the defined types were placed in this encounter.   Medication Changes: No orders of the defined types were placed in this encounter.    Note: This dictation was prepared with Dragon dictation along with smaller phrase technology. Similar sounding words can be transcribed inadequately or may not be corrected upon review. Any transcriptional errors that result from this process are unintentional.      Disposition:  Follow up as scheduled Signed, Perlie Mayo, NP  11/26/2019 11:17 AM     [place]

## 2019-11-26 NOTE — Telephone Encounter (Signed)
Pt made a virtual appt with Jarrett Soho 11-26-19

## 2019-11-26 NOTE — Assessment & Plan Note (Signed)
Educated on viral aspect of infection and that if it continues through weekend she might need antibiotic but at this time, her body is doing well to clear it. Given Lupus hx will do pred 10 dose pack and cough syrup as cough is number 1 complaint today.   Encouraged hydration, mucinex for chest congestion if that develops, vitamin C and other symptom management for URI.

## 2019-12-15 ENCOUNTER — Other Ambulatory Visit: Payer: Self-pay | Admitting: Rheumatology

## 2019-12-15 DIAGNOSIS — M359 Systemic involvement of connective tissue, unspecified: Secondary | ICD-10-CM

## 2019-12-15 NOTE — Telephone Encounter (Signed)
Last Visit: 08/24/2019 Next Visit: 01/26/2020 Labs: 08/24/2019 CBC and CMP WNL.  Eye exam: 01/29/2019 normal  Current Dose per office note 08/24/2019: Plaquenil 200 mg 1 tabletby mouthtwice daily Monday through Friday only. QO:HCOBT systemic lupus erythematosus with other organ involvement   Okay to refill per Dr. Estanislado Pandy

## 2020-01-12 NOTE — Progress Notes (Deleted)
Office Visit Note  Patient: Emma Franco             Date of Birth: January 16, 1980           MRN: [id number]             PCP: Freddy Finner, NP Referring: Wandra Feinstein, MD Visit Date: 01/26/2020 Occupation: @GUAROCC @  Subjective:  No chief complaint on file.   History of Present Illness: Emma Franco is a 40 y.o. female ***   Activities of Daily Living:  Patient reports morning stiffness for *** {minute/hour:19697}.   Patient {ACTIONS;DENIES/REPORTS:21021675::"Denies"} nocturnal pain.  Difficulty dressing/grooming: {ACTIONS;DENIES/REPORTS:21021675::"Denies"} Difficulty climbing stairs: {ACTIONS;DENIES/REPORTS:21021675::"Denies"} Difficulty getting out of chair: {ACTIONS;DENIES/REPORTS:21021675::"Denies"} Difficulty using hands for taps, buttons, cutlery, and/or writing: {ACTIONS;DENIES/REPORTS:21021675::"Denies"}  No Rheumatology ROS completed.   PMFS History:  Patient Active Problem List   Diagnosis Date Noted  . Viral URI with cough 11/26/2019  . Recurrent major depressive disorder, in full remission ([facility]) 11/19/2019  . Other forms of systemic lupus erythematosus ([facility]) 03/05/2019  . Autoimmune disease ([facility]) 06/26/2017  . Scoliosis 05/21/2017  . HIATAL HERNIA 03/18/2008  . ARTHRITIS 02/06/2008    Past Medical History:  Diagnosis Date  . Allergy   . Anxiety   . 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 Chronic sinusitis 05/21/2017   s/p surgery, also complicated by CSF leak, which was repaired   . Environmental allergies   . Esophageal reflux   . ESOPHAGITIS 03/18/2008   Qualifier: Diagnosis of  By: 05/18/2008 CMA ([facility]), Koleen Distance    . Family history of rheumatoid arthritis 05/21/2017   Mother  . Headache(784.0) 07/18/11   "qd for the last year; CSF leak; repaired today"  . Hiatal hernia   . History of miscarriage 06/26/2017   In first trimester  . Irritable bowel syndrome   . Lupus ([facility])   . Migraines    "once in a blue moon"  . PONV (postoperative nausea and  vomiting)   . Postpartum care following cesarean delivery (10/9) 10/16/2013  . Previous cesarean delivery, delivered 10/19/2013  . Scoliosis    mild  . Sinusitis, chronic     Family History  Problem Relation Age of Onset  . Stroke Maternal Grandmother   . Stroke Paternal Grandmother   . Rheum arthritis Mother   . Multiple sclerosis Mother    Past Surgical History:  Procedure Laterality Date  . CESAREAN SECTION  03/2009  . CESAREAN SECTION Bilateral 10/16/2013   Procedure: REPEAT CESAREAN SECTION WITH BILATERAL TUBAL LIGATION ;  Surgeon: 12/16/2013 A. Tresa Endo, MD;  Location: [facility];  Service: Obstetrics;  Laterality: Bilateral;   EDD: 10/26/13  . CHOLECYSTECTOMY  12/1996  . NASAL SEPTUM SURGERY  2006  . NASAL SINUS SURGERY  03/2010; 07/18/11  . REPAIR DURAL / CSF LEAK  07/18/11  . SINUS ENDO W/FUSION  07/18/2011   Procedure: ENDOSCOPIC SINUS SURGERY WITH FUSION NAVIGATION;  Surgeon: 09/18/2011, MD;  Location: [facility];  Service: ENT;  Laterality: N/A;  . TONSILLECTOMY  1990  . TUBAL LIGATION     Social History   Social History Narrative   Lives with husband [facility]- married 14 years    2 girls: Rylee 10 and Stella 6       Dog: Gpys- Double Doodle      Enjoys: hanging out with friends, building a house right now      Diet: eats all food groups    Caffeine: 1-2 cups  Water: 5 bottles       Wears seat belt   Does not use phone while driving   Smoke and carbon Midwife- none    Immunization History  Administered Date(s) Administered  . Influenza,inj,Quad PF,6+ Mos 10/18/2019  . Influenza-Unspecified 09/30/2017, 09/17/2018  . Meningococcal Polysaccharide 07/19/2011  . PFIZER SARS-COV-2 Vaccination 09/03/2019, 09/24/2019  . Pneumococcal Conjugate-13 11/11/2017  . Pneumococcal Polysaccharide-23 07/19/2011     Objective: Vital Signs: There were no vitals taken for this visit.   Physical Exam   Musculoskeletal Exam: ***  CDAI Exam: CDAI  Score: -- Patient Global: --; Provider Global: -- Swollen: --; Tender: -- Joint Exam 01/26/2020   No joint exam has been documented for this visit   There is currently no information documented on the homunculus. Go to the Rheumatology activity and complete the homunculus joint exam.  Investigation: No additional findings.  Imaging: No results found.  Recent Labs: Lab Results  Component Value Date   WBC 4.6 08/24/2019   HGB 13.3 08/24/2019   PLT 221 08/24/2019   NA 137 08/24/2019   K 3.9 08/24/2019   CL 104 08/24/2019   CO2 28 08/24/2019   GLUCOSE 83 08/24/2019   BUN 11 08/24/2019   CREATININE 0.85 08/24/2019   BILITOT 0.5 08/24/2019   ALKPHOS 131 (H) 09/11/2013   AST 13 08/24/2019   ALT 9 08/24/2019   PROT 6.3 08/24/2019   ALBUMIN 2.7 (L) 09/11/2013   CALCIUM 8.7 08/24/2019   GFRAA 99 08/24/2019   QFTBGOLDPLUS NEGATIVE 03/24/2019    Speciality Comments: PLQ Eye Exam: 01/29/2019 normal @ [facility], Dr. Teodoro Spray.  Procedures:  No procedures performed Allergies: Cefdinir and Ciprofloxacin   Assessment / Plan:     Visit Diagnoses: No diagnosis found.  Orders: No orders of the defined types were placed in this encounter.  No orders of the defined types were placed in this encounter.   Face-to-face time spent with patient was *** minutes. Greater than 50% of time was spent in counseling and coordination of care.  Follow-Up Instructions: No follow-ups on file.   Earnestine Mealing, CMA  Note - This record has been created using Editor, commissioning.  Chart creation errors have been sought, but may not always  have been located. Such creation errors do not reflect on  the standard of medical care.

## 2020-01-13 ENCOUNTER — Encounter: Payer: Self-pay | Admitting: Rheumatology

## 2020-01-13 NOTE — Telephone Encounter (Signed)
I do not have any other further recommendations other than OTC products for pain relief.  Please advise the patient to remain hydrated.  She should contact her PCP if the headaches persist or worsen for further evaluation and management.

## 2020-01-22 NOTE — Progress Notes (Signed)
Office Visit Note  Patient: Emma Franco             Date of Birth: 1980/08/17           MRN: [id number]             PCP: Perlie Mayo, NP Referring: Maryruth Hancock, MD Visit Date: 01/27/2020 Occupation: '@[email or web address]' @  Subjective:  Shortness of breath  History of Present Illness: Emma Franco is a 40 y.o. female with history of systemic lupus erythematosus and discoid lupus.  She is taking plaquenil 200 mg 1 tablet by mouth twice daily M-F.  She denies any signs or symptoms of a systemic lupus flare recently.  Patient reports that she was diagnosed with the COVID-19 infection in December 2021.  She states that she was initially experiencing night sweats and a headache.  Over the past several weeks she has had progressive worsening shortness of breath.  She started to use a Breo inhaler 3 days ago which was prescribed by Dr. Chase Caller in the past.  She denies cough, fever, chest pain, or palpitations at this time.  She denies any calf pain or swelling. She continues to have persistent fatigue. She continues to have chronic sicca symptoms.  She has been using eyedrops for symptomatic relief.  She has occasional oral ulcerations but no nasal ulcerations.  She has not had any symptoms of Raynaud's.  She denies any swollen lymph nodes.  She has not had any recent rashes.  She denies any joint pain, joint swelling, or joint stiffness at this time.  She continues to experience generalized muscle tenderness.     Activities of Daily Living:  Patient reports morning stiffness for   none.   Patient Denies nocturnal pain.  Difficulty dressing/grooming: Denies Difficulty climbing stairs: Denies Difficulty getting out of chair: Denies Difficulty using hands for taps, buttons, cutlery, and/or writing: Denies  Review of Systems  Constitutional: Positive for fatigue.  HENT: Positive for mouth dryness. Negative for mouth sores and nose dryness.   Eyes: Positive for dryness. Negative for  pain and visual disturbance.  Respiratory: Positive for shortness of breath. Negative for cough, hemoptysis and difficulty breathing.   Cardiovascular: Negative for chest pain, palpitations, hypertension and swelling in legs/feet.  Gastrointestinal: Positive for constipation and diarrhea. Negative for blood in stool.  Endocrine: Negative for increased urination.  Genitourinary: Negative for painful urination.  Musculoskeletal: Positive for muscle tenderness. Negative for arthralgias, joint pain, joint swelling, myalgias, muscle weakness, morning stiffness and myalgias.  Skin: Negative for color change, pallor, rash, hair loss, nodules/bumps, skin tightness, ulcers and sensitivity to sunlight.  Allergic/Immunologic: Positive for susceptible to infections.  Neurological: Negative for dizziness, numbness and headaches.  Hematological: Negative for bruising/bleeding tendency and swollen glands.  Psychiatric/Behavioral: Positive for sleep disturbance. Negative for depressed mood. The patient is not nervous/anxious.     PMFS History:  Patient Active Problem List   Diagnosis Date Noted  . Viral URI with cough 11/26/2019  . Recurrent major depressive disorder, in full remission ([place]) 11/19/2019  . Other forms of systemic lupus erythematosus ([place]) 03/05/2019  . Autoimmune disease ([place]) 06/26/2017  . Scoliosis 05/21/2017  . HIATAL HERNIA 03/18/2008  . ARTHRITIS 02/06/2008    Past Medical History:  Diagnosis Date  . Allergy   . Anxiety   . 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 Chronic sinusitis 05/21/2017   s/p surgery, also complicated by CSF leak, which was repaired   . Environmental  allergies   . Esophageal reflux   . ESOPHAGITIS 03/18/2008   Qualifier: Diagnosis of  By: Nils Pyle CMA ([place]), Mearl Latin    . Family history of rheumatoid arthritis 05/21/2017   Mother  . Headache(784.0) 07/18/11   "qd for the last year; CSF leak; repaired today"  . Hiatal hernia   . History of miscarriage 06/26/2017   In  first trimester  . Irritable bowel syndrome   . Lupus ([place])   . Migraines    "once in a blue moon"  . PONV (postoperative nausea and vomiting)   . Postpartum care following cesarean delivery (10/9) 10/16/2013  . Previous cesarean delivery, delivered 10/19/2013  . Scoliosis    mild  . Sinusitis, chronic     Family History  Problem Relation Age of Onset  . Stroke Maternal Grandmother   . Stroke Paternal Grandmother   . Rheum arthritis Mother   . Multiple sclerosis Mother    Past Surgical History:  Procedure Laterality Date  . CESAREAN SECTION  03/2009  . CESAREAN SECTION Bilateral 10/16/2013   Procedure: REPEAT CESAREAN SECTION WITH BILATERAL TUBAL LIGATION ;  Surgeon: Claiborne Billings A. Pamala Hurry, MD;  Location: [facility];  Service: Obstetrics;  Laterality: Bilateral;   EDD: 10/26/13  . CHOLECYSTECTOMY  12/1996  . NASAL SEPTUM SURGERY  2006  . NASAL SINUS SURGERY  03/2010; 07/18/11  . REPAIR DURAL / CSF LEAK  07/18/11  . SINUS ENDO W/FUSION  07/18/2011   Procedure: ENDOSCOPIC SINUS SURGERY WITH FUSION NAVIGATION;  Surgeon: Ruby Cola, MD;  Location: [facility];  Service: ENT;  Laterality: N/A;  . TONSILLECTOMY  1990  . TUBAL LIGATION     Social History   Social History Narrative   Lives with husband Legrand Como- married 28 years    2 girls: Rylee 45 and Stella 6       Dog: Gpys- Double Doodle      Enjoys: hanging out with friends, building a house right now      Diet: eats all food groups    Caffeine: 1-2 cups    Water: 5 bottles       Wears seat belt   Does not use phone while driving   Smoke and carbon Midwife- none    Immunization History  Administered Date(s) Administered  . Influenza,inj,Quad PF,6+ Mos 10/18/2019  . Influenza-Unspecified 09/30/2017, 09/17/2018  . Meningococcal Polysaccharide 07/19/2011  . PFIZER(Purple Top)SARS-COV-2 Vaccination 09/03/2019, 09/24/2019  . Pneumococcal Conjugate-13 11/11/2017  . Pneumococcal Polysaccharide-23  07/19/2011     Objective: Vital Signs: BP 113/82 (BP Location: Left Arm, Patient Position: Sitting, Cuff Size: Normal)   Pulse 98   Resp 15   Ht '5\' 5"'  (1.651 m)   Wt 177 lb (80.3 kg)   BMI 29.45 kg/m    Physical Exam Vitals and nursing note reviewed.  Constitutional:      Appearance: She is well-developed and well-nourished.  HENT:     Head: Normocephalic and atraumatic.  Eyes:     Extraocular Movements: EOM normal.     Conjunctiva/sclera: Conjunctivae normal.  Cardiovascular:     Pulses: Intact distal pulses.  Pulmonary:     Effort: Pulmonary effort is normal.     Breath sounds: Normal breath sounds. No wheezing.  Abdominal:     Palpations: Abdomen is soft.  Musculoskeletal:     Cervical back: Normal range of motion.  Skin:    General: Skin is warm and dry.     Capillary Refill:  Capillary refill takes less than 2 seconds.  Neurological:     Mental Status: She is alert and oriented to person, place, and time.  Psychiatric:        Mood and Affect: Mood and affect normal.        Behavior: Behavior normal.      Musculoskeletal Exam: C-spine, thoracic spine, lumbar spine have good range of motion with no discomfort.  Trapezius muscle tension and muscle tenderness bilaterally.  Some tenderness over the right SI joint noted.  No midline spinal tenderness.  Shoulder joints, elbow joints, wrist joints, MCPs, PIPs, DIPs have good range of motion with no synovitis.  She is able to make a complete fist bilaterally.  Hip joints have good range of motion with no discomfort.  Tenderness over the left trochanteric bursa noted.  Knee joints have good range of motion with no warmth or effusion.  Ankle joints have good range of motion with no tenderness or inflammation.  CDAI Exam: CDAI Score: -- Patient Global: --; Provider Global: -- Swollen: --; Tender: -- Joint Exam 01/27/2020   No joint exam has been documented for this visit   There is currently no information documented on the  homunculus. Go to the Rheumatology activity and complete the homunculus joint exam.  Investigation: No additional findings.  Imaging: No results found.  Recent Labs: Lab Results  Component Value Date   WBC 4.6 08/24/2019   HGB 13.3 08/24/2019   PLT 221 08/24/2019   NA 137 08/24/2019   K 3.9 08/24/2019   CL 104 08/24/2019   CO2 28 08/24/2019   GLUCOSE 83 08/24/2019   BUN 11 08/24/2019   CREATININE 0.85 08/24/2019   BILITOT 0.5 08/24/2019   ALKPHOS 131 (H) 09/11/2013   AST 13 08/24/2019   ALT 9 08/24/2019   PROT 6.3 08/24/2019   ALBUMIN 2.7 (L) 09/11/2013   CALCIUM 8.7 08/24/2019   GFRAA 99 08/24/2019   QFTBGOLDPLUS NEGATIVE 03/24/2019    Speciality Comments: PLQ Eye Exam: 01/29/2019 normal @ [employer] [facility], Dr. Teodoro Spray.  Procedures:  No procedures performed Allergies: Cefdinir, Ciprofloxacin, Ciprofibrate, and Other   Assessment / Plan:     Visit Diagnoses: Other systemic lupus erythematosus with other organ involvement (HCC) - ANA positive, dsDNA 48, rest of the ENA negative, C3-C4 normal.  History of fatigue, hair loss, arthralgias, oral ulcers, sicca symptoms and photosensitivity: She has not had any signs or symptoms of a systemic lupus flare.  She is clinically doing well on Plaquenil 200 mg 1 tablet by mouth twice daily Monday through Friday.  She is tolerating Plaquenil and has not missed any doses recently.  She has no joint tenderness or synovitis on exam.  She has not had any recent rashes or discoid lesions.  We discussed the importance of wearing sunscreen daily and avoiding direct sun exposure.  She has not had any symptoms of Raynaud's recently.  She has occasional oral ulcerations but no nasal ulcerations.  She continues to have chronic sicca symptoms and over-the-counter products were discussed today.  Patient was diagnosed with the COVID-19 infection in December 2021.  She did not receive the monoclonal antibody infusion at that time.  Initially her symptoms  were mild including a headache and night sweats.  She has since had increased shortness of breath and for the past 3 days has used a Breo inhaler which was previously prescribed by Dr. Chase Caller.  She was advised to call Dr. Chase Caller today to be evaluated urgently.  Her lungs  were clear to auscultation today.  She has not been experiencing any pleuritic chest pain, cough, palpitations, or calf pain.  No cervical lymphadenopathy.  Lab work from 08/24/19 was reviewed today in the office: CBC and CMP WNL, ESR and complements WNL, dsDNA remains elevated but stable-21.  Orders for updated autoimmune lab work released.  She will continue taking Plaquenil as prescribed.  She was advised to notify us if she develops any new or worsening symptoms.  She will follow up in the office in 3 months.   - Plan: CBC with Differential/Platelet, COMPLETE METABOLIC PANEL WITH GFR, Urinalysis, Routine w reflex microscopic, Anti-DNA antibody, double-stranded, C3 and C4, Sedimentation rate, ANA  High risk medication use - Plaquenil 200 mg 1 tablet by mouth twice daily Monday through Friday only.  Eye Exam: 01/29/2019.  She was given a Plaquenil eye exam form to take with her to her upcoming appointment.  CBC and CMP updated on 08/24/19. She is due to update lab work today.  Orders for CBC and CMP were released.  - Plan: CBC with Differential/Platelet, COMPLETE METABOLIC PANEL WITH GFR She has received both Pfizer COVID-19 vaccinations.  She was diagnosed with COVID-19 in December 2021.  She did not receive the monoclonal antibody infusion. Encouraged to receive the booster dose once she is eligible.   Discoid lupus: She has not had any recurrence of discoid lesions since her follow-up visit.  We discussed the importance of avoiding direct sun exposure.  She was advised to wear sunscreen SPF greater than 50 on a daily basis and reapply every 2 hours if she plans to be outside.  We discussed the importance of wearing UV protective  clothing and a hat if she plans on being outdoors.   Other fatigue: She has been experiencing increased fatigue since being diagnosed with COVID in December 2021.  She has been taking vitamin D 2000 units daily.  She will be following up with Dr. Chase Caller to discuss the shortness of breath she has been experiencing.  Trapezius muscle spasm: She has tenderness over bilateral trapezius muscles. She has not had any muscle spasms recently.   Other form of scoliosis of lumbar spine: She has occasional discomfort in her lower back.  No symptoms of radiculopathy.   Vitamin D deficiency: Vitamin D was 45 on 08/24/19. She is taking vitamin D 2,000 units daily.  History of IBS - She was previously evaluated by Dr. Collene Mares and was started on probiotics which improved her symptoms.   COVID-19 virus infection- She received both pfizer covid-19 vaccine doses (09/03/19 and 09/24/19).  She was diagnosed with the infection in December 2021. Her initial symptoms included a severe headache and night sweats.  She did not have a fever, cough, congestion, or sore throat at that time.  She did not receive the monoclonal antibody infusion. She has noticed increased shortness of breath the past several weeks.  Lungs were clear to ascultation on exam today.  She has not had a cough (dry or productive), pleuritic chest pain, palpitations, or calf pain. She has tried using a breo inhaler that she had leftover.  She was previously evaluated by Dr. Chase Caller in the past. She was advised to call Dr. Golden Pop office today to be evaluated urgently.    Other medical conditions are listed as follows:   History of gastroesophageal reflux (GERD)  History of anxiety  Hiatal hernia  Orders: Orders Placed This Encounter  Procedures  . CBC with Differential/Platelet  . COMPLETE METABOLIC PANEL WITH  GFR  . Urinalysis, Routine w reflex microscopic  . Anti-DNA antibody, double-stranded  . C3 and C4  . Sedimentation rate  . ANA    No orders of the defined types were placed in this encounter.   Follow-Up Instructions: Return in about 3 months (around 04/26/2020) for Systemic lupus erythematosus.   Ofilia Neas, PA-C  Note - This record has been created using Dragon software.  Chart creation errors have been sought, but may not always  have been located. Such creation errors do not reflect on  the standard of medical care.

## 2020-01-26 ENCOUNTER — Ambulatory Visit: Payer: 59 | Admitting: Physician Assistant

## 2020-01-26 DIAGNOSIS — L930 Discoid lupus erythematosus: Secondary | ICD-10-CM

## 2020-01-26 DIAGNOSIS — Z8719 Personal history of other diseases of the digestive system: Secondary | ICD-10-CM

## 2020-01-26 DIAGNOSIS — E559 Vitamin D deficiency, unspecified: Secondary | ICD-10-CM

## 2020-01-26 DIAGNOSIS — R5383 Other fatigue: Secondary | ICD-10-CM

## 2020-01-26 DIAGNOSIS — Z79899 Other long term (current) drug therapy: Secondary | ICD-10-CM

## 2020-01-26 DIAGNOSIS — M62838 Other muscle spasm: Secondary | ICD-10-CM

## 2020-01-26 DIAGNOSIS — Z8659 Personal history of other mental and behavioral disorders: Secondary | ICD-10-CM

## 2020-01-26 DIAGNOSIS — M3219 Other organ or system involvement in systemic lupus erythematosus: Secondary | ICD-10-CM

## 2020-01-26 DIAGNOSIS — K449 Diaphragmatic hernia without obstruction or gangrene: Secondary | ICD-10-CM

## 2020-01-26 DIAGNOSIS — M4186 Other forms of scoliosis, lumbar region: Secondary | ICD-10-CM

## 2020-01-27 ENCOUNTER — Encounter: Payer: Self-pay | Admitting: Physician Assistant

## 2020-01-27 ENCOUNTER — Other Ambulatory Visit: Payer: Self-pay

## 2020-01-27 ENCOUNTER — Ambulatory Visit: Payer: 59 | Admitting: Physician Assistant

## 2020-01-27 VITALS — BP 113/82 | HR 98 | Resp 15 | Ht 65.0 in | Wt 177.0 lb

## 2020-01-27 DIAGNOSIS — M62838 Other muscle spasm: Secondary | ICD-10-CM

## 2020-01-27 DIAGNOSIS — Z79899 Other long term (current) drug therapy: Secondary | ICD-10-CM

## 2020-01-27 DIAGNOSIS — M3219 Other organ or system involvement in systemic lupus erythematosus: Secondary | ICD-10-CM

## 2020-01-27 DIAGNOSIS — L930 Discoid lupus erythematosus: Secondary | ICD-10-CM

## 2020-01-27 DIAGNOSIS — K449 Diaphragmatic hernia without obstruction or gangrene: Secondary | ICD-10-CM

## 2020-01-27 DIAGNOSIS — U071 COVID-19: Secondary | ICD-10-CM

## 2020-01-27 DIAGNOSIS — Z8659 Personal history of other mental and behavioral disorders: Secondary | ICD-10-CM

## 2020-01-27 DIAGNOSIS — M4186 Other forms of scoliosis, lumbar region: Secondary | ICD-10-CM

## 2020-01-27 DIAGNOSIS — R5383 Other fatigue: Secondary | ICD-10-CM | POA: Diagnosis not present

## 2020-01-27 DIAGNOSIS — Z8719 Personal history of other diseases of the digestive system: Secondary | ICD-10-CM

## 2020-01-27 DIAGNOSIS — E559 Vitamin D deficiency, unspecified: Secondary | ICD-10-CM

## 2020-01-28 LAB — CBC WITH DIFFERENTIAL/PLATELET
Absolute Monocytes: 650 cells/uL (ref 200–950)
Basophils Absolute: 39 cells/uL (ref 0–200)
Basophils Relative: 0.7 %
Eosinophils Absolute: 582 cells/uL — ABNORMAL HIGH (ref 15–500)
Eosinophils Relative: 10.4 %
HCT: 42 % (ref 35.0–45.0)
Hemoglobin: 14 g/dL (ref 11.7–15.5)
Lymphs Abs: 1663 cells/uL (ref 850–3900)
MCH: 28.6 pg (ref 27.0–33.0)
MCHC: 33.3 g/dL (ref 32.0–36.0)
MCV: 85.7 fL (ref 80.0–100.0)
MPV: 11 fL (ref 7.5–12.5)
Monocytes Relative: 11.6 %
Neutro Abs: 2666 cells/uL (ref 1500–7800)
Neutrophils Relative %: 47.6 %
Platelets: 226 10*3/uL (ref 140–400)
RBC: 4.9 10*6/uL (ref 3.80–5.10)
RDW: 12.8 % (ref 11.0–15.0)
Total Lymphocyte: 29.7 %
WBC: 5.6 10*3/uL (ref 3.8–10.8)

## 2020-01-28 LAB — COMPLETE METABOLIC PANEL WITH GFR
AG Ratio: 1.5 (calc) (ref 1.0–2.5)
ALT: 15 U/L (ref 6–29)
AST: 15 U/L (ref 10–30)
Albumin: 4.1 g/dL (ref 3.6–5.1)
Alkaline phosphatase (APISO): 51 U/L (ref 31–125)
BUN: 9 mg/dL (ref 7–25)
CO2: 29 mmol/L (ref 20–32)
Calcium: 9.2 mg/dL (ref 8.6–10.2)
Chloride: 102 mmol/L (ref 98–110)
Creat: 0.85 mg/dL (ref 0.50–1.10)
GFR, Est African American: 99 mL/min/{1.73_m2} (ref >=60)
GFR, Est Non African American: 86 mL/min/{1.73_m2} (ref >=60)
Globulin: 2.8 g/dL (calc) (ref 1.9–3.7)
Glucose, Bld: 83 mg/dL (ref 65–99)
Potassium: 4.1 mmol/L (ref 3.5–5.3)
Sodium: 136 mmol/L (ref 135–146)
Total Bilirubin: 0.5 mg/dL (ref 0.2–1.2)
Total Protein: 6.9 g/dL (ref 6.1–8.1)

## 2020-01-28 LAB — URINALYSIS, ROUTINE W REFLEX MICROSCOPIC
Bacteria, UA: NONE SEEN /HPF
Bilirubin Urine: NEGATIVE
Glucose, UA: NEGATIVE
Hgb urine dipstick: NEGATIVE
Hyaline Cast: NONE SEEN /LPF
Ketones, ur: NEGATIVE
Nitrite: NEGATIVE
Protein, ur: NEGATIVE
RBC / HPF: NONE SEEN /HPF (ref 0–2)
Specific Gravity, Urine: 1.015 (ref 1.001–1.03)
pH: 6 (ref 5.0–8.0)

## 2020-01-28 LAB — C3 AND C4
C3 Complement: 90 mg/dL (ref 83–193)
C4 Complement: 29 mg/dL (ref 15–57)

## 2020-01-28 LAB — ANA: Anti Nuclear Antibody (ANA): NEGATIVE

## 2020-01-28 LAB — SEDIMENTATION RATE: Sed Rate: 2 mm/h (ref 0–20)

## 2020-01-28 LAB — ANTI-DNA ANTIBODY, DOUBLE-STRANDED: ds DNA Ab: 16 IU/mL — ABNORMAL HIGH

## 2020-01-28 NOTE — Progress Notes (Signed)
dsDNA is positive-16 but continues to trend down.  Labs are not consistent with a lupus flare.  No change in therapy recommended at this time.    Please ask the patient if she was able to schedule a patient with Dr. Chase Caller.

## 2020-01-28 NOTE — Progress Notes (Signed)
Absolute eosinophils are borderline elevated. Rest of CBC WNL.  CMP WNL.  UA 2+ leukocytes.  Negative nitrites and bacteria.  If she develops symptoms of a UTI please advise the patient to follow up with PCP.  Complements and ESR WNL. dsDNA pending.

## 2020-01-29 NOTE — Progress Notes (Signed)
ANA is negative.

## 2020-02-25 ENCOUNTER — Encounter: Payer: Self-pay | Admitting: Family Medicine

## 2020-02-26 ENCOUNTER — Encounter: Payer: Self-pay | Admitting: Internal Medicine

## 2020-02-26 ENCOUNTER — Other Ambulatory Visit: Payer: Self-pay

## 2020-02-26 ENCOUNTER — Telehealth (INDEPENDENT_AMBULATORY_CARE_PROVIDER_SITE_OTHER): Payer: 59 | Admitting: Internal Medicine

## 2020-02-26 DIAGNOSIS — J0110 Acute frontal sinusitis, unspecified: Secondary | ICD-10-CM

## 2020-02-26 DIAGNOSIS — R42 Dizziness and giddiness: Secondary | ICD-10-CM

## 2020-02-26 DIAGNOSIS — R197 Diarrhea, unspecified: Secondary | ICD-10-CM | POA: Insufficient documentation

## 2020-02-26 MED ORDER — AZITHROMYCIN 250 MG PO TABS: ORAL_TABLET | ORAL | 0 refills | Status: DC

## 2020-02-26 NOTE — Progress Notes (Signed)
Virtual Visit via Telephone Note   This visit type was conducted due to national recommendations for restrictions regarding the COVID-19 Pandemic (e.g. social distancing) in an effort to limit this patient's exposure and mitigate transmission in our community.  Due to her co-morbid illnesses, this patient is at least at moderate risk for complications without adequate follow up.  This format is felt to be most appropriate for this patient at this time.  The patient did not have access to video technology/had technical difficulties with video requiring transitioning to audio format only (telephone).  All issues noted in this document were discussed and addressed.  No physical exam could be performed with this format.    Evaluation Performed:  Follow-up visit  Date:  02/26/2020   ID:  Emma Franco, DOB [birth year], MRN [id number]  Patient Location: Home Provider Location: Office/Clinic  Participants: Patient Location of Patient: Home Location of Provider: Telehealth Consent was obtain for visit to be over via telehealth. I verified that I am speaking with the correct person using two identifiers.  PCP:  Perlie Mayo, NP   Chief Complaint:  Nasal congestion, cough and sore throat  History of Present Illness:    Emma Franco is a 40 y.o. female with PMH of seasonal allergies and lupus who has a televisit for c/o cough with greenish sputum, sinus pain/pressure - frontal headache and sore throat for about 5 days. She also has been having vertigo and ear fullness, but denies ear pain/discharge. She had negative COVID test at home. She denies any change in taste or smell sensation.  The patient does not have symptoms concerning for COVID-19 infection (fever, chills, cough, or new shortness of breath).   Past Medical, Surgical, Social History, Allergies, and Medications have been Reviewed.  Past Medical History:  Diagnosis Date  . Allergy   . Anxiety   . Arthritis     "in my back"  . Chronic sinusitis 05/21/2017   s/p surgery, also complicated by CSF leak, which was repaired   . Environmental allergies   . Esophageal reflux   . ESOPHAGITIS 03/18/2008   Qualifier: Diagnosis of  By: Nils Pyle CMA ([place]), Mearl Latin    . Family history of rheumatoid arthritis 05/21/2017   Mother  . Headache(784.0) 07/18/11   "qd for the last year; CSF leak; repaired today"  . Hiatal hernia   . History of miscarriage 06/26/2017   In first trimester  . Irritable bowel syndrome   . Lupus ([place])   . Migraines    "once in a blue moon"  . PONV (postoperative nausea and vomiting)   . Postpartum care following cesarean delivery (10/9) 10/16/2013  . Previous cesarean delivery, delivered 10/19/2013  . Scoliosis    mild  . Sinusitis, chronic    Past Surgical History:  Procedure Laterality Date  . CESAREAN SECTION  03/2009  . CESAREAN SECTION Bilateral 10/16/2013   Procedure: REPEAT CESAREAN SECTION WITH BILATERAL TUBAL LIGATION ;  Surgeon: Claiborne Billings A. Pamala Hurry, MD;  Location: [facility];  Service: Obstetrics;  Laterality: Bilateral;   EDD: 10/26/13  . CHOLECYSTECTOMY  12/1996  . NASAL SEPTUM SURGERY  2006  . NASAL SINUS SURGERY  03/2010; 07/18/11  . REPAIR DURAL / CSF LEAK  07/18/11  . SINUS ENDO W/FUSION  07/18/2011   Procedure: ENDOSCOPIC SINUS SURGERY WITH FUSION NAVIGATION;  Surgeon: Ruby Cola, MD;  Location: [facility];  Service: ENT;  Laterality: N/A;  . TONSILLECTOMY  1990  . TUBAL LIGATION  Current Meds  Medication Sig  . azithromycin (ZITHROMAX) 250 MG tablet Take as package instructions.  Marland Kitchen buPROPion (WELLBUTRIN SR) 200 MG 12 hr tablet Take 1 tablet (200 mg total) by mouth 2 (two) times daily.  . cetirizine (ZYRTEC) 10 MG tablet Take 10 mg by mouth daily.  Marland Kitchen escitalopram (LEXAPRO) 20 MG tablet Take 1 tablet (20 mg total) by mouth daily.  . fluticasone (FLONASE) 50 MCG/ACT nasal spray Place 1 spray into both nostrils as needed.  . hydroxychloroquine (PLAQUENIL) 200 MG tablet  TAKE 1 TABLET TWICE DAILY MONDAY THROUGH FRIDAY.  . Probiotic Product (PROBIOTIC DAILY PO)   . VITAMIN D PO Take by mouth daily.     Allergies:   Cefdinir, Ciprofloxacin, Ciprofibrate, and Other   ROS:   Please see the history of present illness.     All other systems reviewed and are negative.   Labs/Other Tests and Data Reviewed:    Recent Labs: 03/24/2019: TSH 4.11 01/27/2020: ALT 15; BUN 9; Creat 0.85; Hemoglobin 14.0; Platelets 226; Potassium 4.1; Sodium 136   Recent Lipid Panel No results found for: CHOL, TRIG, HDL, CHOLHDL, LDLCALC, LDLDIRECT  Wt Readings from Last 3 Encounters:  01/27/20 177 lb (80.3 kg)  11/26/19 173 lb (78.5 kg)  11/17/19 173 lb (78.5 kg)      ASSESSMENT & PLAN:    Acute sinusitis Prescribed Azithromycin Allergic to cefdinir according to chart review Continue Flonase Advised to use humidifier at nighttime  Vertigo Likely due to ear fullness and related to sinusitis Most likely it would resolve with resolution of sinusitis Meclizine PRN for now  Time:   Today, I have spent 10 minutes reviewing the chart, including problem list, medications, and with the patient with telehealth technology discussing the above problems.   Medication Adjustments/Labs and Tests Ordered: Current medicines are reviewed at length with the patient today.  Concerns regarding medicines are outlined above.   Tests Ordered: No orders of the defined types were placed in this encounter.   Medication Changes: Meds ordered this encounter  Medications  . azithromycin (ZITHROMAX) 250 MG tablet    Sig: Take as package instructions.    Dispense:  6 tablet    Refill:  0     Note: This dictation was prepared with Dragon dictation along with smaller phrase technology. Similar sounding words can be transcribed inadequately or may not be corrected upon review. Any transcriptional errors that result from this process are unintentional.      Disposition:  Follow up   Signed, Lindell Spar, MD  02/26/2020 9:21 AM     [place]

## 2020-02-26 NOTE — Patient Instructions (Signed)
Please start taking Azithromycin as prescribed.  Continue to use Flonase for allergic symptoms and nasal congestion. Use humidifier at nighttime.  Okay to use Meclizine 12.5 mg as needed for vertigo.

## 2020-03-24 ENCOUNTER — Other Ambulatory Visit: Payer: Self-pay | Admitting: Rheumatology

## 2020-03-24 DIAGNOSIS — M359 Systemic involvement of connective tissue, unspecified: Secondary | ICD-10-CM

## 2020-03-24 NOTE — Telephone Encounter (Signed)
Last Visit: 01/27/2020 Next Visit: 04/27/2020 Labs: 01/27/2020, Absolute eosinophils are borderline elevated. Rest of CBC WNL. CMP WNL. UA 2+ leukocytes. Negative nitrites and bacteria. If she develops symptoms of a UTI please advise the patient to follow up with PCP. Complements and ESR WNL. dsDNA is positive-16 but continues to trend down. Labs are not consistent with a lupus flare. No change in therapy recommended at this time. ANA is negative.   Eye exam: 01/29/2019, patient stated she had PLQ eye exam done on 03/18/2020  Current Dose per office note 01/27/2020, Plaquenil 200 mg 1 tablet by mouth twice daily Monday through Friday DX: Other systemic lupus erythematosus with other organ involvement   Last Fill: 12/15/2019  Okay to refill Plaquenil?

## 2020-04-15 NOTE — Progress Notes (Signed)
Office Visit Note  Patient: Emma Franco             Date of Birth: 1980/12/04           MRN: [id number]             PCP: Perlie Mayo, NP Referring: Perlie Mayo, NP Visit Date: 04/27/2020 Occupation: '@[email or web address]' @  Subjective:  Trapezius muscle tension   History of Present Illness: Emma Franco is a 40 y.o. female with history of systemic lupus erythematous and discoid lupus. She is taking plaquenil 200 mg 1 tablet by mouth twice daily Monday through Friday.  She is tolerating plaquenil without any side effects.  She denies any signs or symptoms of a flare.  She denies any joint pain or joint swelling at this time.  She continues to have trapezius muscle tension and tenderness bilaterally. She has a massage every month and sees her chiropractor every 2 weeks to help manage her discomfort.  She has started to walk more for the past 1 month for exercise.  She continues to have chronic fatigue but has been sleeping well at night.  She states her shortness of breath has improved significantly since her last office visit. She did not end up seeing the pulmonologist since her symptoms improved.  She denies any cough, pleuritic chest pain, or palpations.  She denies any discoid lesions.  She has ongoing photosensitivity.  She has not noticed any symptoms of raynaud's and has no digital ulcers.  She continues to have hair thinning which has been stable.  She states her eye dryness has started to improve since starting on restasis by her ophthalmologist. She continues to have dry mouth but is not using any OTC products.  She denies any oral or nasal ulcers. She has noticed gradual weight gain over the past several months and is unsure what has provoked it.  Her diet has not changed and she has started to try to increase her activity level over the past 1 month.     Activities of Daily Living:  Patient reports morning stiffness for 30 minutes.   Patient Denies nocturnal pain.   Difficulty dressing/grooming: Denies Difficulty climbing stairs: Denies Difficulty getting out of chair: Denies Difficulty using hands for taps, buttons, cutlery, and/or writing: Denies  Review of Systems  Constitutional: Positive for fatigue.  HENT: Positive for mouth dryness. Negative for mouth sores and nose dryness.   Eyes: Positive for dryness. Negative for pain and itching.  Respiratory: Negative for shortness of breath and difficulty breathing.   Cardiovascular: Negative for chest pain and palpitations.  Gastrointestinal: Positive for constipation. Negative for blood in stool and diarrhea.  Endocrine: Negative for increased urination.  Genitourinary: Negative for difficulty urinating.  Musculoskeletal: Positive for myalgias, morning stiffness, muscle tenderness and myalgias. Negative for arthralgias, joint pain and joint swelling.  Skin: Negative for color change, rash and redness.  Allergic/Immunologic: Negative for susceptible to infections.  Neurological: Positive for dizziness, headaches and weakness. Negative for numbness and memory loss.  Hematological: Positive for bruising/bleeding tendency.  Psychiatric/Behavioral: Negative for confusion.    PMFS History:  Patient Active Problem List   Diagnosis Date Noted  . Diarrhea 02/26/2020  . Viral URI with cough 11/26/2019  . Recurrent major depressive disorder, in full remission ([place]) 11/19/2019  . Other forms of systemic lupus erythematosus ([place]) 03/05/2019  . Autoimmune disease ([place]) 06/26/2017  . Scoliosis 05/21/2017  . Previous child with congenital anomaly, currently pregnant, antepartum 06/18/2013  .  Gastroesophageal reflux disease 03/18/2008  . HIATAL HERNIA 03/18/2008  . Irritable bowel syndrome 02/06/2008  . ARTHRITIS 02/06/2008  . Periumbilical pain 83/33/8329    Past Medical History:  Diagnosis Date  . Allergy   . Anxiety   . 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 Chronic sinusitis 05/21/2017   s/p surgery, also  complicated by CSF leak, which was repaired   . Environmental allergies   . Esophageal reflux   . ESOPHAGITIS 03/18/2008   Qualifier: Diagnosis of  By: Nils Pyle CMA ([place]), Mearl Latin    . Family history of rheumatoid arthritis 05/21/2017   Mother  . Headache(784.0) 07/18/11   "qd for the last year; CSF leak; repaired today"  . Hiatal hernia   . History of miscarriage 06/26/2017   In first trimester  . Irritable bowel syndrome   . Lupus ([place])   . Migraines    "once in a blue moon"  . PONV (postoperative nausea and vomiting)   . Postpartum care following cesarean delivery (10/9) 10/16/2013  . Previous cesarean delivery, delivered 10/19/2013  . Scoliosis    mild  . Sinusitis, chronic     Family History  Problem Relation Age of Onset  . Stroke Maternal Grandmother   . Stroke Paternal Grandmother   . Rheum arthritis Mother   . Multiple sclerosis Mother    Past Surgical History:  Procedure Laterality Date  . CESAREAN SECTION  03/2009  . CESAREAN SECTION Bilateral 10/16/2013   Procedure: REPEAT CESAREAN SECTION WITH BILATERAL TUBAL LIGATION ;  Surgeon: Claiborne Billings A. Pamala Hurry, MD;  Location: [facility];  Service: Obstetrics;  Laterality: Bilateral;   EDD: 10/26/13  . CHOLECYSTECTOMY  12/1996  . NASAL SEPTUM SURGERY  2006  . NASAL SINUS SURGERY  03/2010; 07/18/11  . REPAIR DURAL / CSF LEAK  07/18/11  . SINUS ENDO W/FUSION  07/18/2011   Procedure: ENDOSCOPIC SINUS SURGERY WITH FUSION NAVIGATION;  Surgeon: Ruby Cola, MD;  Location: [facility];  Service: ENT;  Laterality: N/A;  . TONSILLECTOMY  1990  . TUBAL LIGATION     Social History   Social History Narrative   Lives with husband Legrand Como- married 22 years    2 girls: Rylee 20 and Stella 6       Dog: Gpys- Double Doodle      Enjoys: hanging out with friends, building a house right now      Diet: eats all food groups    Caffeine: 1-2 cups    Water: 5 bottles       Wears seat belt   Does not use phone while driving   Smoke and carbon Teacher, music- none    Immunization History  Administered Date(s) Administered  . Influenza,inj,Quad PF,6+ Mos 10/18/2019  . Influenza-Unspecified 09/30/2017, 09/17/2018  . Meningococcal Polysaccharide 07/19/2011  . PFIZER(Purple Top)SARS-COV-2 Vaccination 09/03/2019, 09/24/2019  . Pneumococcal Conjugate-13 11/11/2017  . Pneumococcal Polysaccharide-23 07/19/2011     Objective: Vital Signs: BP (!) 131/95 (BP Location: Left Arm, Patient Position: Sitting, Cuff Size: Normal)   Pulse 80   Resp 13   Ht '5\' 5"'  (1.651 m)   Wt 184 lb (83.5 kg)   BMI 30.62 kg/m    Physical Exam Vitals and nursing note reviewed.  Constitutional:      Appearance: She is well-developed.  HENT:     Head: Normocephalic and atraumatic.  Eyes:     Conjunctiva/sclera: Conjunctivae normal.  Pulmonary:     Effort: Pulmonary effort  is normal.  Abdominal:     Palpations: Abdomen is soft.  Musculoskeletal:     Cervical back: Normal range of motion.  Skin:    General: Skin is warm and dry.     Capillary Refill: Capillary refill takes less than 2 seconds.  Neurological:     Mental Status: She is alert and oriented to person, place, and time.  Psychiatric:        Behavior: Behavior normal.      Musculoskeletal Exam: C-spine, thoracic spine, and lumbar spine good ROM with no discomfort. Midline spinal tenderness in the thoracic and lumbar region. Trapezius muscle tension and tenderness bilaterally.  Shoulder joints, elbow joints, wrist joints, MCPs, PIPs, and DIPs good ROM with no synovitis.  Complete fist formation bilaterally.  Hip joints, knee joints, and ankle joints good ROM with no discomfort.  No warmth or effusion of knee joints.  No tenderness or swelling of ankle joints.  Tenderness over bilateral trochanteric bursa.    CDAI Exam: CDAI Score: -- Patient Global: --; Provider Global: -- Swollen: --; Tender: -- Joint Exam 04/27/2020   No joint exam has been documented for this visit    There is currently no information documented on the homunculus. Go to the Rheumatology activity and complete the homunculus joint exam.  Investigation: No additional findings.  Imaging: No results found.  Recent Labs: Lab Results  Component Value Date   WBC 5.6 01/27/2020   HGB 14.0 01/27/2020   PLT 226 01/27/2020   NA 136 01/27/2020   K 4.1 01/27/2020   CL 102 01/27/2020   CO2 29 01/27/2020   GLUCOSE 83 01/27/2020   BUN 9 01/27/2020   CREATININE 0.85 01/27/2020   BILITOT 0.5 01/27/2020   ALKPHOS 131 (H) 09/11/2013   AST 15 01/27/2020   ALT 15 01/27/2020   PROT 6.9 01/27/2020   ALBUMIN 2.7 (L) 09/11/2013   CALCIUM 9.2 01/27/2020   GFRAA 99 01/27/2020   QFTBGOLDPLUS NEGATIVE 03/24/2019    Speciality Comments: PLQ Eye Exam: 03/18/2020 WNL Dr. Teodoro Spray.  Procedures:  No procedures performed Allergies: Cefdinir, Ciprofloxacin, and Ciprofibrate       Assessment / Plan:     Visit Diagnoses: Other systemic lupus erythematosus with other organ involvement (HCC) - ANA positive, dsDNA 48, rest of the ENA negative, C3-C4 normal.  History of fatigue, hair loss, arthralgias, oral ulcers, sicca symptoms and photosensitivity:  She has not had any signs or symptoms of a flare.  She is clinically doing well taking plaquenil 200 mg 1 tablet by mouth twice daily Monday through Monday.  She has no synovitis on exam.  Her overall myalgias and arthralgias have improved.  She has not had any symptoms of raynaud's and no digital ulcerations or signs or sclerodactyly noted.  She has not had any recent rashes or discoid lesions.  She has ongoing hair thinning and photosensitivity.  Discussed the importance of avoiding direct sun exposure and to wear sunscreen daily.  She continues to have chronic fatigue but has been trying to increase her exercise regimen.  Her eye dryness has improved since starting on restasis prescribed by her ophthalmologist.  She has not had any oral or nasal ulcers.   Lab work reviewed from 01/27/2020: ANA negative, complements within normal limits, ESR within normal limits, and double-stranded DNA-16-trended down.  Lab work was reviewed with the patient today in the office and all questions were addressed.  Future orders for lab work were placed today to be drawn in June.  She will continue on the current treatment regimen.  She was advised to notify us if she develops signs or symptoms of a flare.  She will follow up in 5 months.   - Plan: CBC with Differential/Platelet, COMPLETE METABOLIC PANEL WITH GFR, Urinalysis, Routine w reflex microscopic, Anti-DNA antibody, double-stranded, C3 and C4, Sedimentation rate  High risk medication use - Plaquenil 200 mg 1 tablet by mouth twice daily Monday through Friday only.  CBC and CMP were updated on 01/27/2020.   Her next lab work will be due in June and every 5 months.  Future orders placed today.  PLQ Eye Exam: 03/18/2020 WNL Dr. Teodoro Spray.  - Plan: CBC with Differential/Platelet, COMPLETE METABOLIC PANEL WITH GFR She has not had any recent infections.    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  Discussed the importance of avoiding direct sun exposure, sunscreen SPF 50 use on a daily basis, and wearing a hat/sun protective clothing.  She was encouraged to schedule a routine visit with her dermatologist.   Other fatigue: Chronic, unchanged.  Discussed the importance of regular exercise and good sleep hygiene.    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 She has a massage every month and sees the chiropractor every 2 weeks.  Discussed the importance of performing stretching exercises daily.  She uses a heating pad daily.  She plans on seeing if her chiropractor will try dry needling, which alleviated her symptoms in the past.  Other form of scoliosis of lumbar spine: She sees a chiropractor every 2 weeks.   Vitamin D deficiency: She is taking vitamin D 2,000 units  daily.   Other medical conditions are listed as follows:   History of gastroesophageal reflux (GERD)  History of IBS - She was previously evaluated by Dr. Collene Mares and was started on probiotics which improved her symptoms.   History of anxiety: She is taking Lexapro as prescribed.    Hiatal hernia  COVID-19 virus infection - She received both pfizer covid-19 vaccine doses (09/03/19 and 09/24/19).  She was diagnosed with the infection in December 2021.   Orders: Orders Placed This Encounter  Procedures  . CBC with Differential/Platelet  . COMPLETE METABOLIC PANEL WITH GFR  . Urinalysis, Routine w reflex microscopic  . Anti-DNA antibody, double-stranded  . C3 and C4  . Sedimentation rate   No orders of the defined types were placed in this encounter.     Follow-Up Instructions: Return in about 5 months (around 09/27/2020) for Systemic lupus erythematosus.   Ofilia Neas, PA-C  Note - This record has been created using Dragon software.  Chart creation errors have been sought, but may not always  have been located. Such creation errors do not reflect on  the standard of medical care.

## 2020-04-27 ENCOUNTER — Encounter: Payer: Self-pay | Admitting: Physician Assistant

## 2020-04-27 ENCOUNTER — Other Ambulatory Visit: Payer: Self-pay

## 2020-04-27 ENCOUNTER — Ambulatory Visit: Payer: 59 | Admitting: Physician Assistant

## 2020-04-27 VITALS — BP 131/95 | HR 80 | Resp 13 | Ht 65.0 in | Wt 184.0 lb

## 2020-04-27 DIAGNOSIS — Z8659 Personal history of other mental and behavioral disorders: Secondary | ICD-10-CM

## 2020-04-27 DIAGNOSIS — M62838 Other muscle spasm: Secondary | ICD-10-CM

## 2020-04-27 DIAGNOSIS — M3219 Other organ or system involvement in systemic lupus erythematosus: Secondary | ICD-10-CM | POA: Diagnosis not present

## 2020-04-27 DIAGNOSIS — R5383 Other fatigue: Secondary | ICD-10-CM

## 2020-04-27 DIAGNOSIS — U071 COVID-19: Secondary | ICD-10-CM

## 2020-04-27 DIAGNOSIS — Z79899 Other long term (current) drug therapy: Secondary | ICD-10-CM | POA: Diagnosis not present

## 2020-04-27 DIAGNOSIS — E559 Vitamin D deficiency, unspecified: Secondary | ICD-10-CM

## 2020-04-27 DIAGNOSIS — M4186 Other forms of scoliosis, lumbar region: Secondary | ICD-10-CM

## 2020-04-27 DIAGNOSIS — L930 Discoid lupus erythematosus: Secondary | ICD-10-CM | POA: Diagnosis not present

## 2020-04-27 DIAGNOSIS — K449 Diaphragmatic hernia without obstruction or gangrene: Secondary | ICD-10-CM

## 2020-04-27 DIAGNOSIS — Z8719 Personal history of other diseases of the digestive system: Secondary | ICD-10-CM

## 2020-04-27 NOTE — Patient Instructions (Signed)
Standing Labs We placed an order today for your standing lab work.   Please have your standing labs drawn in June   If possible, please have your labs drawn 2 weeks prior to your appointment so that the provider can discuss your results at your appointment.  We have open lab daily Monday through Thursday from 1:30-4:30 PM and Friday from 1:30-4:00 PM at the office of Dr. Bo Merino, [facility].   Please be advised, all patients with office appointments requiring lab work will take precedents over walk-in lab work.  If possible, please come for your lab work on Monday and Friday afternoons, as you may experience shorter wait times. The office is located at [address], [place], [place], [place] [address] No appointment is necessary.   Labs are drawn by [facility]. Please bring your co-pay at the time of your lab draw.  You may receive a bill from [facility] for your lab work.  If you wish to have your labs drawn at another location, please call the office 24 hours in advance to send orders.  If you have any questions regarding directions or hours of operation,  please call [phone].   As a reminder, please drink plenty of water prior to coming for your lab work. Thanks!

## 2020-05-18 DIAGNOSIS — G9600 Cerebrospinal fluid leak, unspecified: Secondary | ICD-10-CM | POA: Insufficient documentation

## 2020-05-18 DIAGNOSIS — N83209 Unspecified ovarian cyst, unspecified side: Secondary | ICD-10-CM | POA: Insufficient documentation

## 2020-05-18 DIAGNOSIS — F419 Anxiety disorder, unspecified: Secondary | ICD-10-CM | POA: Insufficient documentation

## 2020-07-06 ENCOUNTER — Other Ambulatory Visit: Payer: Self-pay | Admitting: Physician Assistant

## 2020-07-06 DIAGNOSIS — M359 Systemic involvement of connective tissue, unspecified: Secondary | ICD-10-CM

## 2020-07-06 NOTE — Telephone Encounter (Signed)
Last Visit: 04/27/2020  Next Visit: 09/28/2020  Labs: 01/27/2020 Absolute eosinophils are borderline elevated. Rest of CBC WNL.  CMP WNL.  Eye exam: 03/18/2020 WNL   Current Dose per office note 04/27/2020: 200 mg 1 tablet by mouth twice daily Monday through Friday only. ER:[id number] systemic lupus erythematosus with other organ involvement  Last Fill: 03/24/2020  Patient advised she is due to update labs. Patient states she will update 07/07/2020  Okay to refill Plaquenil?

## 2020-07-07 ENCOUNTER — Other Ambulatory Visit: Payer: Self-pay | Admitting: *Deleted

## 2020-07-07 DIAGNOSIS — M3219 Other organ or system involvement in systemic lupus erythematosus: Secondary | ICD-10-CM

## 2020-07-07 DIAGNOSIS — Z79899 Other long term (current) drug therapy: Secondary | ICD-10-CM

## 2020-07-08 LAB — URINALYSIS, ROUTINE W REFLEX MICROSCOPIC
Bacteria, UA: NONE SEEN /HPF
Bilirubin Urine: NEGATIVE
Glucose, UA: NEGATIVE
Hyaline Cast: NONE SEEN /LPF
Ketones, ur: NEGATIVE
Leukocytes,Ua: NEGATIVE
Nitrite: NEGATIVE
Protein, ur: NEGATIVE
Specific Gravity, Urine: 1.005 (ref 1.001–1.035)
Squamous Epithelial / HPF: NONE SEEN /HPF (ref <=5)
WBC, UA: NONE SEEN /HPF (ref 0–5)
pH: 6 (ref 5.0–8.0)

## 2020-07-08 LAB — CBC WITH DIFFERENTIAL/PLATELET
Absolute Monocytes: 559 cells/uL (ref 200–950)
Basophils Absolute: 68 cells/uL (ref 0–200)
Basophils Relative: 1.2 %
Eosinophils Absolute: 593 cells/uL — ABNORMAL HIGH (ref 15–500)
Eosinophils Relative: 10.4 %
HCT: 41.7 % (ref 35.0–45.0)
Hemoglobin: 13.4 g/dL (ref 11.7–15.5)
Lymphs Abs: 1858 cells/uL (ref 850–3900)
MCH: 28.2 pg (ref 27.0–33.0)
MCHC: 32.1 g/dL (ref 32.0–36.0)
MCV: 87.6 fL (ref 80.0–100.0)
MPV: 10.4 fL (ref 7.5–12.5)
Monocytes Relative: 9.8 %
Neutro Abs: 2622 cells/uL (ref 1500–7800)
Neutrophils Relative %: 46 %
Platelets: 218 10*3/uL (ref 140–400)
RBC: 4.76 10*6/uL (ref 3.80–5.10)
RDW: 12.5 % (ref 11.0–15.0)
Total Lymphocyte: 32.6 %
WBC: 5.7 10*3/uL (ref 3.8–10.8)

## 2020-07-08 LAB — COMPLETE METABOLIC PANEL WITH GFR
AG Ratio: 1.6 (calc) (ref 1.0–2.5)
ALT: 8 U/L (ref 6–29)
AST: 13 U/L (ref 10–30)
Albumin: 3.9 g/dL (ref 3.6–5.1)
Alkaline phosphatase (APISO): 51 U/L (ref 31–125)
BUN: 7 mg/dL (ref 7–25)
CO2: 27 mmol/L (ref 20–32)
Calcium: 8.8 mg/dL (ref 8.6–10.2)
Chloride: 103 mmol/L (ref 98–110)
Creat: 0.82 mg/dL (ref 0.50–1.10)
GFR, Est African American: 103 mL/min/{1.73_m2} (ref >=60)
GFR, Est Non African American: 89 mL/min/{1.73_m2} (ref >=60)
Globulin: 2.4 g/dL (calc) (ref 1.9–3.7)
Glucose, Bld: 77 mg/dL (ref 65–99)
Potassium: 4 mmol/L (ref 3.5–5.3)
Sodium: 136 mmol/L (ref 135–146)
Total Bilirubin: 0.5 mg/dL (ref 0.2–1.2)
Total Protein: 6.3 g/dL (ref 6.1–8.1)

## 2020-07-08 LAB — C3 AND C4
C3 Complement: 99 mg/dL (ref 83–193)
C4 Complement: 31 mg/dL (ref 15–57)

## 2020-07-08 LAB — SEDIMENTATION RATE: Sed Rate: 2 mm/h (ref 0–20)

## 2020-07-08 LAB — ANTI-DNA ANTIBODY, DOUBLE-STRANDED: ds DNA Ab: 16 IU/mL — ABNORMAL HIGH

## 2020-07-08 LAB — MICROSCOPIC MESSAGE

## 2020-07-08 NOTE — Progress Notes (Signed)
Absolute eosinophils borderline elevated.  Rest of CBC WNL. CMP WNL.  ESR WNL.  UA revealed 1+ hgb.  Please clarify if the patient was on her menstrual cycle.

## 2020-07-12 NOTE — Progress Notes (Signed)
dsDNA is negative. Complements WNL.

## 2020-07-13 ENCOUNTER — Encounter: Payer: 59 | Admitting: Family Medicine

## 2020-08-08 ENCOUNTER — Other Ambulatory Visit: Payer: Self-pay | Admitting: Physician Assistant

## 2020-08-08 DIAGNOSIS — M359 Systemic involvement of connective tissue, unspecified: Secondary | ICD-10-CM

## 2020-08-08 NOTE — Telephone Encounter (Signed)
Last Visit: 04/27/2020 Next Visit: 09/28/2020 Labs: 07/07/2020, Absolute eosinophils borderline elevated.  Rest of CBC WNL. CMP WNL.  ESR WNL. UA revealed 1+ hgb.  Please clarify if the patient was on her menstrual cycle, dsDNA is negative.  Complements WNL.   Eye exam: 03/18/2020 WNL   Current Dose per office note 04/27/2020: Plaquenil 200 mg 1 tablet by mouth twice daily Monday through Friday only [id number] systemic lupus erythematosus with other organ involvement  Last Fill: 07/06/2020  Okay to refill Plaquenil?

## 2020-08-23 ENCOUNTER — Encounter: Payer: 59 | Admitting: Family Medicine

## 2020-08-23 ENCOUNTER — Encounter: Payer: 59 | Admitting: Internal Medicine

## 2020-09-02 ENCOUNTER — Telehealth: Payer: Self-pay

## 2020-09-02 NOTE — Telephone Encounter (Signed)
Pt called to ask if normal to have stomach feel "full" after chemo tx.  Attempted to return cal to pt. LVM for CB.

## 2020-09-14 NOTE — Progress Notes (Signed)
Office Visit Note  Patient: Emma Franco             Date of Birth: 1980-08-09           MRN: [id number]             PCP: Lindell Spar, MD Referring: Perlie Mayo, NP Visit Date: 09/28/2020 Occupation: '@GUAROCC' @  Subjective:  Left hip pain   History of Present Illness: Meli Faley is a 40 y.o. female with history of systemic lupus erythematosus.  She is taking plaquenil 200 mg 1 tablet by mouth twice daily Monday through Friday only. She continues to tolerate PLQ without any side effects.  She denies any signs or symptoms of a systemic lupus clinically.  She states that she continues to have fatigue on a daily basis, eye dryness, and hair loss.  She uses Restasis eyedrops on a daily basis for symptomatic relief.  She states over a month ago she had one mouth sore which healed within 2 weeks.  She denies any mouth dryness at this time.  She has not had any symptoms of Raynaud's.  She denies any rashes or increased sun sensitivity this summer.  She denies any shortness of breath, pleuritic chest pain, or palpitations. Patient reports that on Monday she fell while carrying several things down her steps while in slippers.  She landed on her left buttocks and noticed significant bruising right away.  She states that her left hip and buttocks have been very tender and the bruising has been profound.  She also has noticed some blood blisters which is concerning to her.  She was not evaluated at PCP or urgent care after the fall.  She says that she has noticed some bruising on her left elbow but denies any other injuries.  She states that her left side and general muscle soreness but denies any other joint pain or joint swelling.  She took ibuprofen right after the fall and then again last night for pain relief.  She has been applying ice topically as needed.  She is having trapezius muscle tension and tenderness bilaterally.  She would like trigger point injections bilaterally.        Activities of Daily Living:  Patient reports morning stiffness for 30 minutes.   Patient Denies nocturnal pain.  Difficulty dressing/grooming: Denies Difficulty climbing stairs: Denies Difficulty getting out of chair: Denies Difficulty using hands for taps, buttons, cutlery, and/or writing: Denies  Review of Systems  Constitutional:  Positive for fatigue.  HENT:  Positive for mouth dryness. Negative for mouth sores and nose dryness.   Eyes:  Positive for pain, itching and dryness.  Respiratory:  Positive for shortness of breath. Negative for difficulty breathing.   Cardiovascular:  Negative for chest pain and palpitations.  Gastrointestinal:  Positive for constipation and diarrhea. Negative for blood in stool.  Endocrine: Negative for increased urination.  Genitourinary:  Negative for difficulty urinating.  Musculoskeletal:  Positive for myalgias, morning stiffness, muscle tenderness and myalgias. Negative for joint pain, joint pain and joint swelling.  Skin:  Negative for color change, rash and redness.  Allergic/Immunologic: Negative for susceptible to infections.  Neurological:  Positive for dizziness. Negative for numbness, headaches and memory loss.  Hematological:  Positive for bruising/bleeding tendency.  Psychiatric/Behavioral:  Negative for confusion.    PMFS History:  Patient Active Problem List   Diagnosis Date Noted   Diarrhea 02/26/2020   Viral URI with cough 11/26/2019   Recurrent major depressive  disorder, in full remission ([place]) 11/19/2019   Other forms of systemic lupus erythematosus ([place]) 03/05/2019   Autoimmune disease ([place]) 06/26/2017   Scoliosis 05/21/2017   Previous child with congenital anomaly, currently pregnant, antepartum 06/18/2013   Gastroesophageal reflux disease 03/18/2008   HIATAL HERNIA 03/18/2008   Irritable bowel syndrome 02/06/2008   ARTHRITIS 94/80/1655   Periumbilical pain 37/48/2707    Past Medical History:  Diagnosis Date    Allergy    Anxiety    Arthritis    "in my back"   Chronic sinusitis 05/21/2017   s/p surgery, also complicated by CSF leak, which was repaired    Environmental allergies    Esophageal reflux    ESOPHAGITIS 03/18/2008   Qualifier: Diagnosis of  By: Nils Pyle CMA (AAMA), Mearl Latin     Family history of rheumatoid arthritis 05/21/2017   Mother   Headache(784.0) 07/18/11   "qd for the last year; CSF leak; repaired today"   Hiatal hernia    History of miscarriage 06/26/2017   In first trimester   Irritable bowel syndrome    Lupus ([place])    Migraines    "once in a blue moon"   PONV (postoperative nausea and vomiting)    Postpartum care following cesarean delivery (10/9) 10/16/2013   Previous cesarean delivery, delivered 10/19/2013   Scoliosis    mild   Sinusitis, chronic     Family History  Problem Relation Age of Onset   Rheum arthritis Mother    Multiple sclerosis Mother    Breast cancer Mother    Stroke Maternal Grandmother    Stroke Paternal Grandmother    Past Surgical History:  Procedure Laterality Date   CESAREAN SECTION  03/2009   CESAREAN SECTION Bilateral 10/16/2013   Procedure: REPEAT CESAREAN SECTION WITH BILATERAL TUBAL LIGATION ;  Surgeon: Claiborne Billings A. Pamala Hurry, MD;  Location: [facility];  Service: Obstetrics;  Laterality: Bilateral;   EDD: 10/26/13   CHOLECYSTECTOMY  12/1996   NASAL SEPTUM SURGERY  2006   NASAL SINUS SURGERY  03/2010; 07/18/11   REPAIR DURAL / CSF LEAK  07/18/11   SINUS ENDO W/FUSION  07/18/2011   Procedure: ENDOSCOPIC SINUS SURGERY WITH FUSION NAVIGATION;  Surgeon: Ruby Cola, MD;  Location: [facility];  Service: ENT;  Laterality: N/A;   [place] History   Social History Narrative   Lives with husband Legrand Como- married 93 years    2 girls: Rylee 69 and Stella 6       Dog: Gpys- Double Doodle      Enjoys: hanging out with friends, building a house right now      Diet: eats all food groups    Caffeine: 1-2 cups    Water:  5 bottles       Wears seat belt   Does not use phone while driving   Smoke and carbon Midwife- none    Immunization History  Administered Date(s) Administered   Influenza,inj,Quad PF,6+ Mos 10/18/2019   Influenza-Unspecified 09/30/2017, 09/17/2018   Meningococcal Polysaccharide 07/19/2011   PFIZER(Purple Top)SARS-COV-2 Vaccination 09/03/2019, 09/24/2019   Pneumococcal Conjugate-13 11/11/2017   Pneumococcal Polysaccharide-23 07/19/2011     Objective: Vital Signs: BP 100/71 (BP Location: Left Arm, Patient Position: Sitting, Cuff Size: Normal)   Pulse 87   Ht '5\' 5"'  (1.651 m)   Wt 187 lb 9.6 oz (85.1 kg)   BMI 31.22 kg/m    Physical Exam Vitals and  nursing note reviewed.  Constitutional:      Appearance: She is well-developed.  HENT:     Head: Normocephalic and atraumatic.  Eyes:     Conjunctiva/sclera: Conjunctivae normal.  Abdominal:     Palpations: Abdomen is soft.  Musculoskeletal:     Cervical back: Normal range of motion.  Skin:    General: Skin is warm and dry.     Capillary Refill: Capillary refill takes less than 2 seconds.     Findings: Bruising present.     Comments: Profound, diffuse ecchymosis and several small blood filled vesicles noted over left buttock.  Tenderness over a small hematoma with soft surrounding tissue noted.  Ecchymosis over extensor surface of left elbow.  No olecranon bursitis or open wound noted.   No malar rash. No digital ulcerations or signs of gangrene.   Neurological:     Mental Status: She is alert and oriented to person, place, and time.  Psychiatric:        Behavior: Behavior normal.     Musculoskeletal Exam: C-spine, thoracic spine, and lumbar spine good ROM.  No midline spinal tenderness.  No SI joint tenderness noted. Shoulder joints, elbow joints, wrist joints, MCPs, PIPs, and DIPs good ROM wit no synovitis.  Complete fist formation bilaterally.  Hip joints have good ROM with no groin pain.   Tenderness over bilateral trochanteric bursa.  Knee joints have good ROM with no warmth or effusion.  Ankle joints have good ROM with no tenderness or joint swelling.  No tenderness over MTP joints.   CDAI Exam: CDAI Score: -- Patient Global: --; Provider Global: -- Swollen: --; Tender: -- Joint Exam 09/28/2020   No joint exam has been documented for this visit   There is currently no information documented on the homunculus. Go to the Rheumatology activity and complete the homunculus joint exam.  Investigation: No additional findings.  Imaging: No results found.  Recent Labs: Lab Results  Component Value Date   WBC 5.7 07/07/2020   HGB 13.4 07/07/2020   PLT 218 07/07/2020   NA 136 07/07/2020   K 4.0 07/07/2020   CL 103 07/07/2020   CO2 27 07/07/2020   GLUCOSE 77 07/07/2020   BUN 7 07/07/2020   CREATININE 0.82 07/07/2020   BILITOT 0.5 07/07/2020   ALKPHOS 131 (H) 09/11/2013   AST 13 07/07/2020   ALT 8 07/07/2020   PROT 6.3 07/07/2020   ALBUMIN 2.7 (L) 09/11/2013   CALCIUM 8.8 07/07/2020   GFRAA 103 07/07/2020   QFTBGOLDPLUS NEGATIVE 03/24/2019    Speciality Comments: PLQ Eye Exam: 03/18/2020 WNL Dr. Teodoro Spray.  Procedures:  Trigger Point Inj  Date/Time: 09/28/2020 8:47 AM Performed by: Ofilia Neas, PA-C Authorized by: Ofilia Neas, PA-C   Consent Given by:  Patient Site marked: the procedure site was marked   Timeout: prior to procedure the correct patient, procedure, and site was verified   Indications:  Pain Total # of Trigger Points:  2 Location: neck   Needle Size:  27 G Approach:  Dorsal Medications #1:  0.5 mL lidocaine 1 %; 10 mg triamcinolone acetonide 40 MG/ML Medications #2:  0.5 mL lidocaine 1 %; 10 mg triamcinolone acetonide 40 MG/ML Patient tolerance:  Patient tolerated the procedure well with no immediate complications Allergies: Cefdinir, Ciprofloxacin, and Ciprofibrate     Assessment / Plan:     Visit Diagnoses: Other systemic  lupus erythematosus with other organ involvement ([facility]) - ANA positive, dsDNA 48, rest of the ENA negative,  C3-C4 normal.  History of fatigue, hair loss, arthralgias, oral ulcers, sicca symptoms and photosensitivity: She has not had any signs or symptoms of a systemic lupus flare recently.  She is clinically doing well taking Plaquenil 200 mg 1 tablet by mouth twice daily Monday through Friday.  She continues to tolerate Plaquenil without any side effects and has not missed any doses recently.  She continues to experience fatigue on a daily basis, ongoing hair thinning, and eye dryness.  She has been using Restasis on a daily basis for symptomatic relief.  She has tried several over-the-counter products for hair loss but did not notice any improvement in her symptoms.  She has not had any recent rashes or increased photosensitivity. No malar rash was noted on exam. Raynaud's phenomenon is not currently active.  No digital ulcerations or signs of gangrene were noted.   She has not had any shortness of breath, pleuritic chest pain, or palpitations.  She continues to experience intermittent myalgias and muscle tenderness but overall her symptoms have been stable.  She has no synovitis on examination today. Lab work was updated on 07/07/20: DsDNA is 16, complements WNL, and ESR WNL and results are not consistent with a flare.  We will update the following autoimmune lab work today.  She will remain on plaquenil as prescribed.   She was advised to notify us if she develops signs or symptoms of a flare.   Plan: Protein / creatinine ratio, urine, COMPLETE METABOLIC PANEL WITH GFR, CBC with Differential/Platelet, Anti-DNA antibody, double-stranded, C3 and C4, Sedimentation rate, Sedimentation rate, C3 and C4, Anti-DNA antibody, double-stranded, CBC with Differential/Platelet, COMPLETE METABOLIC PANEL WITH GFR, Protein / creatinine ratio, urine  High risk medication use - Plaquenil 200 mg 1 tablet by mouth twice daily  Monday through Friday only. PLQ Eye Exam: 03/18/2020 WNL Dr. Teodoro Spray.  CBC and CMP updated on 07/07/20.  She will have updated lab work today.  Orders released.   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   Other fatigue: She continues to experience fatigue on a daily basis.  Discussed the importance of regular exercise.   Trapezius muscle spasm: She presents today with trapezius muscle tension and tenderness bilaterally.  She has been experiencing increased pain in the left side since her fall 2 days ago.  She requested trigger point injections today.  She tolerated the procedure well.  Procedure note was completed above.  Aftercare was discussed.  Other form of scoliosis of lumbar spine: She is not experiencing any increased lower back discomfort at this time.  No midline spinal tenderness or tenderness over the SI joints were noted.  No symptoms of radiculopathy.  Vitamin D deficiency: She remains on a vitamin D supplement daily.  History of recent fall -She presents today after a fall on 09/26/20.  She was walking down her steps at home with slippers on and her hands full and slipped down several step and landed on her left side. No LOC.  Patient is not on a blood thinner.  According to the patient she noticed bruising on her left buttocks right away.  As the day went on the bruising became more discolored and she had significant tenderness over the left buttocks.  She also noticed a few "blood blisters" form.  She initially took ibuprofen after the fall and had another dose of ibuprofen yesterday evening.  She has been applying ice topically as needed for pain relief.  On examination today she has profound and diffuse ecchymosis over the left buttocks.  A small tender hematoma was centralized and palpable.  Surrounding tissue was soft  and mildly tender.  Several small bullae were noted.  She did not have any midline spinal tenderness or tenderness over the SI joints.  She had good range of motion of both hip joints with no groin pain.  She has tenderness over bilateral trochanteric bursa especially the left side.  X-rays of the pelvis were obtained today for further evaluation.  No obvious acute bony abnormality was noted.   Discussed conservative treatment measures today.  We also discussed the instructions for care of the hematoma.  All questions were addressed.  She was advised to notify her PCP or be seen urgently at urgent care or the ED if her symptoms persist or worsen.   She voiced understanding.   Due to the profound bruising a CBC was also ordered today. Plan: XR Pelvis 1-2 Views  Left hip pain - She presents today after a fall on her left side on 09/26/2020.  On examination she has good range of motion of the left hip joint with no groin pain.  She has some tenderness over the left trochanteric bursa.  No midline spinal tenderness or SI joint tenderness was noted.  X-rays of the pelvis were obtained today which were unremarkable for an acute abnormality.  She was advised to follow-up with her PCP or Ortho urgent care if her symptoms persist or worsen.  Plan: XR Pelvis 1-2 Views  Hematoma: She has a small hematoma, centralized left buttock region.  Soft surrounding tissue noted.  Diffuse, profound ecchymosis was noted over the entirety of the left buttocks.  She is not currently on any blood thinners.  Discussed that Plaquenil has mild blood thinning properties.  She has not been taking any over-the-counter supplements with blood thinning properties. Hematoma care was discussed including the use of ice 2-3 per day for 20 minutes. Advised to avoid heat at this time.  She was advised to keep the area clean and covered if the bullae burst.  She was advised to seek further evaluation if she develops a fever, increased discoloration,  swelling, or increased pain.    Other medical conditions are listed as follows:   History of IBS - She was previously evaluated by Dr. Collene Mares and was started on probiotics which improved her symptoms.   History of gastroesophageal reflux (GERD)  Hiatal hernia  History of anxiety  COVID-19 virus infection - She received both pfizer covid-19 vaccine doses (09/03/19 and 09/24/19).  She was diagnosed with the infection in December 2021.   Orders: Orders Placed This Encounter  Procedures   Trigger Point Inj   XR Pelvis 1-2 Views   Protein / creatinine ratio, urine   COMPLETE METABOLIC PANEL WITH GFR   CBC with Differential/Platelet   Anti-DNA antibody, double-stranded   C3 and C4   Sedimentation rate    No orders of the defined types were placed in this encounter.  Follow-Up Instructions: Return in about 5 months (around 02/28/2021) for Systemic lupus erythematosus.   Ofilia Neas, PA-C  Note - This record has been created using Dragon software.  Chart creation errors have been sought, but may not always  have been located. Such creation errors do not reflect on  the standard of medical care.

## 2020-09-28 ENCOUNTER — Ambulatory Visit: Payer: 59 | Admitting: Physician Assistant

## 2020-09-28 ENCOUNTER — Encounter: Payer: Self-pay | Admitting: Physician Assistant

## 2020-09-28 ENCOUNTER — Other Ambulatory Visit: Payer: Self-pay

## 2020-09-28 ENCOUNTER — Ambulatory Visit: Payer: Self-pay

## 2020-09-28 VITALS — BP 100/71 | HR 87 | Ht 65.0 in | Wt 187.6 lb

## 2020-09-28 DIAGNOSIS — M3219 Other organ or system involvement in systemic lupus erythematosus: Secondary | ICD-10-CM

## 2020-09-28 DIAGNOSIS — M4186 Other forms of scoliosis, lumbar region: Secondary | ICD-10-CM

## 2020-09-28 DIAGNOSIS — L930 Discoid lupus erythematosus: Secondary | ICD-10-CM | POA: Diagnosis not present

## 2020-09-28 DIAGNOSIS — Z79899 Other long term (current) drug therapy: Secondary | ICD-10-CM | POA: Diagnosis not present

## 2020-09-28 DIAGNOSIS — K449 Diaphragmatic hernia without obstruction or gangrene: Secondary | ICD-10-CM

## 2020-09-28 DIAGNOSIS — E559 Vitamin D deficiency, unspecified: Secondary | ICD-10-CM

## 2020-09-28 DIAGNOSIS — M25552 Pain in left hip: Secondary | ICD-10-CM | POA: Diagnosis not present

## 2020-09-28 DIAGNOSIS — Z8719 Personal history of other diseases of the digestive system: Secondary | ICD-10-CM

## 2020-09-28 DIAGNOSIS — T148XXA Other injury of unspecified body region, initial encounter: Secondary | ICD-10-CM

## 2020-09-28 DIAGNOSIS — M62838 Other muscle spasm: Secondary | ICD-10-CM | POA: Diagnosis not present

## 2020-09-28 DIAGNOSIS — R5383 Other fatigue: Secondary | ICD-10-CM

## 2020-09-28 DIAGNOSIS — Z8659 Personal history of other mental and behavioral disorders: Secondary | ICD-10-CM

## 2020-09-28 DIAGNOSIS — Z9181 History of falling: Secondary | ICD-10-CM

## 2020-09-28 DIAGNOSIS — U071 COVID-19: Secondary | ICD-10-CM

## 2020-09-28 MED ORDER — LIDOCAINE HCL 1 % IJ SOLN
0.5000 mL | INTRAMUSCULAR | Status: AC | PRN
  Administered 2020-09-28: .5 mL

## 2020-09-28 MED ORDER — TRIAMCINOLONE ACETONIDE 40 MG/ML IJ SUSP
10.0000 mg | INTRAMUSCULAR | Status: AC | PRN
  Administered 2020-09-28: 10 mg via INTRAMUSCULAR

## 2020-09-28 NOTE — Patient Instructions (Signed)
Hematoma A hematoma is a collection of blood under the skin, in an organ, in a body space, in a joint space, or in other tissue. The blood can thicken (clot) to form a lump that you can see and feel. The lump is often firm and may become sore and tender. Most hematomas get better in a few days to weeks. However, some hematomas may be serious and require medical care. Hematomas can range from very small to very large. What are the causes? This condition is caused by: A blunt or penetrating injury. A leakage from a blood vessel under the skin. Some medical procedures, including surgeries, such as oral surgery, face lifts, and surgeries on the joints. Some medical conditions that cause bleeding or bruising. There may be multiple hematomas that appear in different areas of the body. What increases the risk? You are more likely to develop this condition if: You are an older adult. You use blood thinners. What are the signs or symptoms? Symptoms of this condition depend on where the hematoma is located.  Common symptoms of a hematoma that is under the skin include: A firm lump on the body. Pain and tenderness in the area. Bruising. Blue, dark blue, purple-red, or yellowish skin (discoloration) may appear at the site of the hematoma if the hematoma is close to the surface of the skin. Common symptoms of a hematoma that is deep in the tissues or body spaces may be less obvious. They include: A collection of blood in the stomach (intra-abdominal hematoma). This may cause pain in the abdomen, weakness, fainting, and shortness of breath. A collection of blood in the head (intracranial hematoma). This may cause a headache or symptoms such as weakness, trouble speaking or understanding, or a change in consciousness.  How is this diagnosed? This condition is diagnosed based on: Your medical history. A physical exam. Imaging tests, such as an ultrasound or CT scan. These may be needed if your health care  provider suspects a hematoma in deeper tissues or body spaces. Blood tests. These may be needed if your health care provider believes that the hematoma is caused by a medical condition. How is this treated? Treatment for this condition depends on the cause, size, and location of the hematoma. Treatment may include: Doing nothing. The majority of hematomas do not need treatment as many of them go away on their own over time. Surgery or close monitoring. This may be needed for large hematomas or hematomas that affect vital organs. Medicines. Medicines may be given if there is an underlying medical cause for the hematoma. Follow these instructions at home: Managing pain, stiffness, and swelling  If directed, put ice on the affected area. Put ice in a plastic bag. Place a towel between your skin and the bag. Leave the ice on for 20 minutes, 2-3 times a day for the first couple of days. If directed, apply heat to the affected area after applying ice for a couple of days. Use the heat source that your health care provider recommends, such as a moist heat pack or a heating pad. Place a towel between your skin and the heat source. Leave the heat on for 20-30 minutes. Remove the heat if your skin turns bright red. This is especially important if you are unable to feel pain, heat, or cold. You may have a greater risk of getting burned. Raise (elevate) the affected area above the level of your heart while you are sitting or lying down. If told, wrap the  affected area with an elastic bandage. The bandage applies pressure (compression) to the area, which may help to reduce swelling and promote healing. Do not wrap the bandage too tightly around the affected area. If your hematoma is on a leg or foot (lower extremity) and is painful, your health care provider may recommend crutches. Use them as told by your health care provider. General instructions Take over-the-counter and prescription medicines only as  told by your health care provider. Keep all follow-up visits as told by your health care provider. This is important. Contact a health care provider if: You have a fever. The swelling or discoloration gets worse. You develop more hematomas. Get help right away if: Your pain is worse or your pain is not controlled with medicine. Your skin over the hematoma breaks or starts bleeding. Your hematoma is in your chest or abdomen and you have weakness, shortness of breath, or a change in consciousness. You have a hematoma on your scalp that is caused by a fall or injury, and you also have: A headache that gets worse. Trouble speaking or understanding speech. Weakness. Change in alertness or consciousness. Summary A hematoma is a collection of blood under the skin, in an organ, in a body space, in a joint space, or in other tissue. This condition usually does not need treatment because many hematomas go away on their own over time. Large hematomas, or those that may affect vital organs, may need surgical drainage or monitoring. If the hematoma is caused by a medical condition, medicines may be prescribed. Get help right away if your hematoma breaks or starts to bleed, you have shortness of breath, or you have a headache or trouble speaking after a fall. This information is not intended to replace advice given to you by your health care provider. Make sure you discuss any questions you have with your health care provider. Document Revised: 05/21/2018 Document Reviewed: 05/30/2017 Elsevier Patient Education  2022 [employer].

## 2020-09-29 LAB — CBC WITH DIFFERENTIAL/PLATELET
Absolute Monocytes: 610 cells/uL (ref 200–950)
Basophils Absolute: 63 cells/uL (ref 0–200)
Basophils Relative: 1.1 %
Eosinophils Absolute: 502 cells/uL — ABNORMAL HIGH (ref 15–500)
Eosinophils Relative: 8.8 %
HCT: 36.3 % (ref 35.0–45.0)
Hemoglobin: 11.8 g/dL (ref 11.7–15.5)
Lymphs Abs: 1431 cells/uL (ref 850–3900)
MCH: 28.6 pg (ref 27.0–33.0)
MCHC: 32.5 g/dL (ref 32.0–36.0)
MCV: 88.1 fL (ref 80.0–100.0)
MPV: 11.2 fL (ref 7.5–12.5)
Monocytes Relative: 10.7 %
Neutro Abs: 3095 cells/uL (ref 1500–7800)
Neutrophils Relative %: 54.3 %
Platelets: 193 10*3/uL (ref 140–400)
RBC: 4.12 10*6/uL (ref 3.80–5.10)
RDW: 12.9 % (ref 11.0–15.0)
Total Lymphocyte: 25.1 %
WBC: 5.7 10*3/uL (ref 3.8–10.8)

## 2020-09-29 LAB — COMPLETE METABOLIC PANEL WITH GFR
AG Ratio: 1.6 (calc) (ref 1.0–2.5)
ALT: 9 U/L (ref 6–29)
AST: 13 U/L (ref 10–30)
Albumin: 3.8 g/dL (ref 3.6–5.1)
Alkaline phosphatase (APISO): 51 U/L (ref 31–125)
BUN: 9 mg/dL (ref 7–25)
CO2: 28 mmol/L (ref 20–32)
Calcium: 8.7 mg/dL (ref 8.6–10.2)
Chloride: 103 mmol/L (ref 98–110)
Creat: 0.77 mg/dL (ref 0.50–0.99)
Globulin: 2.4 g/dL (calc) (ref 1.9–3.7)
Glucose, Bld: 86 mg/dL (ref 65–99)
Potassium: 4.1 mmol/L (ref 3.5–5.3)
Sodium: 138 mmol/L (ref 135–146)
Total Bilirubin: 0.5 mg/dL (ref 0.2–1.2)
Total Protein: 6.2 g/dL (ref 6.1–8.1)
eGFR: 99 mL/min/{1.73_m2} (ref >=60)

## 2020-09-29 LAB — PROTEIN / CREATININE RATIO, URINE
Creatinine, Urine: 33 mg/dL (ref 20–275)
Protein/Creat Ratio: 182 mg/g creat (ref 24–184)
Protein/Creatinine Ratio: 0.182 mg/mg creat (ref 0.024–0.184)
Total Protein, Urine: 6 mg/dL (ref 5–24)

## 2020-09-29 LAB — C3 AND C4
C3 Complement: 108 mg/dL (ref 83–193)
C4 Complement: 33 mg/dL (ref 15–57)

## 2020-09-29 LAB — ANTI-DNA ANTIBODY, DOUBLE-STRANDED: ds DNA Ab: 15 IU/mL — ABNORMAL HIGH

## 2020-09-29 LAB — SEDIMENTATION RATE: Sed Rate: 6 mm/h (ref 0–20)

## 2020-09-29 NOTE — Progress Notes (Signed)
dsDNA is stable-15.  Continue on current therapy.  Please advise the patient to notify us if she develops signs or symptoms of a flare.

## 2020-09-29 NOTE — Progress Notes (Signed)
CMP WNL.  Absolute eosinophils remains borderline elevated-502.  Rest of CBC WNL.  ESR and complements WNL.  Protein creatinine ratio is WNL.

## 2020-11-15 ENCOUNTER — Ambulatory Visit
Admission: RE | Admit: 2020-11-15 | Discharge: 2020-11-15 | Disposition: A | Discharge status: Home / Self Care | Payer: 59 | Source: Ambulatory Visit | Attending: Student | Admitting: Student

## 2020-11-15 ENCOUNTER — Other Ambulatory Visit: Payer: Self-pay

## 2020-11-15 VITALS — BP 120/89 | HR 82 | Temp 98.5°F | Resp 18

## 2020-11-15 DIAGNOSIS — J0100 Acute maxillary sinusitis, unspecified: Secondary | ICD-10-CM

## 2020-11-15 DIAGNOSIS — J209 Acute bronchitis, unspecified: Secondary | ICD-10-CM

## 2020-11-15 DIAGNOSIS — M329 Systemic lupus erythematosus, unspecified: Secondary | ICD-10-CM | POA: Diagnosis not present

## 2020-11-15 MED ORDER — PROMETHAZINE-DM 6.25-15 MG/5ML PO SYRP: 2.5000 mL | ORAL_SOLUTION | Freq: Four times a day (QID) | ORAL | 0 refills | Status: DC | PRN

## 2020-11-15 MED ORDER — ALBUTEROL SULFATE HFA 108 (90 BASE) MCG/ACT IN AERS: 1.0000 | INHALATION_SPRAY | Freq: Four times a day (QID) | RESPIRATORY_TRACT | 0 refills | Status: DC | PRN

## 2020-11-15 MED ORDER — AZITHROMYCIN 250 MG PO TABS: 250.0000 mg | ORAL_TABLET | Freq: Every day | ORAL | 0 refills | Status: DC

## 2020-11-15 NOTE — Discharge Instructions (Signed)
-  You have bronchitis. Bronchitis is an inflammation of the lining of your bronchial tubes, which carry air to and from your lungs. This typically occurs after a virus, like a cold virus. People who have bronchitis often cough up thickened mucus, which can be discolored. This isn't a bacterial infection, so you don't need antibiotics. We treat it with medications to help reduce inflammation and open up your lungs.   -Albuterol inhaler as needed for cough, wheezing, shortness of breath, 1 to 2 puffs every 6 hours as needed. -Promethazine DM cough syrup for congestion/cough. This could make you drowsy, so take at night before bed. -Continue doxycycline as directed -With a virus, you're typically contagious for 5-7 days, or as long as you're having fevers. Follow-up if symptoms getting worse instead of better

## 2020-11-15 NOTE — ED Triage Notes (Signed)
Pt reports having a sinus infection, headache, cough.Cough is worse at night. Pt is taking doxycycline and prednisone x 5 days without relief.

## 2020-11-15 NOTE — ED Provider Notes (Signed)
[facility]    CSN: [id number] Arrival date & time: 11/15/20  0940      History   Chief Complaint Chief Complaint  Patient presents with   Appointment    0900   Nasal Congestion        Headache    HPI Emma Franco is a 40 y.o. female presenting with nasal congestion, headache, cough, shortness of breath. Medical history allergies, Lupus, chronic sinusitis s/p surgery. Already had a televisit for this 11/2 and was prescribed doxycycline and prednisone which she is taking as directed.  States that the facial pain is getting worse, especially on the left side under her eye.  Cough is productive of yellow sputum.  Shortness of breath during coughing episodes and with talking.  Denies fever/chills.  Also using Flonase for the nasal symptoms.  HPI  Past Medical History:  Diagnosis Date   Allergy    Anxiety    Arthritis    "in my back"   Chronic sinusitis 05/21/2017   s/p surgery, also complicated by CSF leak, which was repaired    Environmental allergies    Esophageal reflux    ESOPHAGITIS 03/18/2008   Qualifier: Diagnosis of  By: Nils Pyle CMA (AAMA), Mearl Latin     Family history of rheumatoid arthritis 05/21/2017   Mother   Headache(784.0) 07/18/11   "qd for the last year; CSF leak; repaired today"   Hiatal hernia    History of miscarriage 06/26/2017   In first trimester   Irritable bowel syndrome    Lupus ([place])    Migraines    "once in a blue moon"   PONV (postoperative nausea and vomiting)    Postpartum care following cesarean delivery (10/9) 10/16/2013   Previous cesarean delivery, delivered 10/19/2013   Scoliosis    mild   Sinusitis, chronic     Patient Active Problem List   Diagnosis Date Noted   Diarrhea 02/26/2020   Viral URI with cough 11/26/2019   Recurrent major depressive disorder, in full remission ([place]) 11/19/2019   Other forms of systemic lupus erythematosus ([place]) 03/05/2019   Autoimmune disease ([place]) 06/26/2017   Scoliosis 05/21/2017    Previous child with congenital anomaly, currently pregnant, antepartum 06/18/2013   Gastroesophageal reflux disease 03/18/2008   HIATAL HERNIA 03/18/2008   Irritable bowel syndrome 02/06/2008   ARTHRITIS 63/14/9702   Periumbilical pain 63/78/5885    Past Surgical History:  Procedure Laterality Date   CESAREAN SECTION  03/2009   CESAREAN SECTION Bilateral 10/16/2013   Procedure: REPEAT CESAREAN SECTION WITH BILATERAL TUBAL LIGATION ;  Surgeon: Claiborne Billings A. Pamala Hurry, MD;  Location: [facility];  Service: Obstetrics;  Laterality: Bilateral;   EDD: 10/26/13   CHOLECYSTECTOMY  12/1996   NASAL SEPTUM SURGERY  2006   NASAL SINUS SURGERY  03/2010; 07/18/11   REPAIR DURAL / CSF LEAK  07/18/11   SINUS ENDO W/FUSION  07/18/2011   Procedure: ENDOSCOPIC SINUS SURGERY WITH FUSION NAVIGATION;  Surgeon: Ruby Cola, MD;  Location: [facility];  Service: ENT;  Laterality: N/A;   TONSILLECTOMY  1990   TUBAL LIGATION      OB History     Gravida  3   Para  2   Term  2   Preterm      AB  1   Living  2      SAB  1   IAB      Ectopic      Multiple      Live Births  1            Home Medications    Prior to Admission medications   Medication Sig Start Date End Date Taking? Authorizing Provider  albuterol (VENTOLIN HFA) 108 (90 Base) MCG/ACT inhaler Inhale 1-2 puffs into the lungs every 6 (six) hours as needed for wheezing or shortness of breath. 11/15/20  Yes Hazel Sams, PA-C  azithromycin (ZITHROMAX) 250 MG tablet Take 1 tablet (250 mg total) by mouth daily. Take first 2 tablets together, then 1 every day until finished. 11/15/20  Yes Hazel Sams, PA-C  promethazine-dextromethorphan (PROMETHAZINE-DM) 6.25-15 MG/5ML syrup Take 2.5 mLs by mouth 4 (four) times daily as needed for cough. 11/15/20  Yes Hazel Sams, PA-C  buPROPion [facility] SR) 200 MG 12 hr tablet Take 1 tablet (200 mg total) by mouth 2 (two) times daily. 03/05/19   Maryruth Hancock, MD  cetirizine (ZYRTEC) 10 MG tablet  Take 10 mg by mouth daily.    [provider]  cycloSPORINE (RESTASIS) 0.05 % ophthalmic emulsion 1 drop 2 (two) times daily.    [provider]  escitalopram (LEXAPRO) 20 MG tablet Take 1 tablet (20 mg total) by mouth daily. 03/05/19   Corum, Rex Kras, MD  fluticasone (FLONASE) 50 MCG/ACT nasal spray Place 1 spray into both nostrils as needed. 03/05/19   Maryruth Hancock, MD  hydroxychloroquine (PLAQUENIL) 200 MG tablet TAKE 1 TABLET TWICE DAILY MONDAY THROUGH FRIDAY. 08/08/20   Ofilia Neas, PA-C  VITAMIN D PO Take by mouth daily.    [provider]    Family History Family History  Problem Relation Age of Onset   Rheum arthritis Mother    Multiple sclerosis Mother    Breast cancer Mother    Stroke Maternal Grandmother    Stroke Paternal Grandmother     Social History Social History   Tobacco Use   Smoking status: Never   Smokeless tobacco: Never  Vaping Use   Vaping Use: Never used  Substance Use Topics   Alcohol use: No   Drug use: No     Allergies   Cefdinir, Ciprofloxacin, and Ciprofibrate   Review of Systems Review of Systems  Constitutional:  Negative for appetite change, chills and fever.  HENT:  Positive for congestion, sinus pressure and sinus pain. Negative for ear pain, rhinorrhea and sore throat.   Eyes:  Negative for redness and visual disturbance.  Respiratory:  Positive for cough and shortness of breath. Negative for chest tightness and wheezing.   Cardiovascular:  Negative for chest pain and palpitations.  Gastrointestinal:  Negative for abdominal pain, constipation, diarrhea, nausea and vomiting.  Genitourinary:  Negative for dysuria, frequency and urgency.  Musculoskeletal:  Negative for myalgias.  Neurological:  Negative for dizziness, weakness and headaches.  Psychiatric/Behavioral:  Negative for confusion.   All other systems reviewed and are negative.   Physical Exam Triage Vital Signs ED Triage Vitals  Enc Vitals Group      BP 11/15/20 1032 120/89     Pulse Rate 11/15/20 1032 82     Resp 11/15/20 1032 18     Temp 11/15/20 1032 98.5 F (36.9 C)     Temp Source 11/15/20 1032 Oral     SpO2 11/15/20 1032 98 %     Weight --      Height --      Head Circumference --      Peak Flow --      Pain Score 11/15/20 1030 8  Pain Loc --      Pain Edu? --      Excl. in [place]? --    No data found.  Updated Vital Signs BP 120/89 (BP Location: Right Arm)   Pulse 82   Temp 98.5 F (36.9 C) (Oral)   Resp 18   LMP  (Within Weeks) Comment: 2 weeks  SpO2 98%   Breastfeeding No   Visual Acuity Right Eye Distance:   Left Eye Distance:   Bilateral Distance:    Right Eye Near:   Left Eye Near:    Bilateral Near:     Physical Exam Vitals reviewed.  Constitutional:      General: She is not in acute distress.    Appearance: Normal appearance. She is not ill-appearing.  HENT:     Head: Normocephalic and atraumatic.     Right Ear: Tympanic membrane, ear canal and external ear normal. No tenderness. No middle ear effusion. There is no impacted cerumen. Tympanic membrane is not perforated, erythematous, retracted or bulging.     Left Ear: Tympanic membrane, ear canal and external ear normal. No tenderness.  No middle ear effusion. There is no impacted cerumen. Tympanic membrane is not perforated, erythematous, retracted or bulging.     Nose: No congestion.     Right Sinus: No maxillary sinus tenderness or frontal sinus tenderness.     Left Sinus: Maxillary sinus tenderness present. No frontal sinus tenderness.     Mouth/Throat:     Mouth: Mucous membranes are moist.     Pharynx: Uvula midline. No oropharyngeal exudate or posterior oropharyngeal erythema.  Eyes:     Extraocular Movements: Extraocular movements intact.     Pupils: Pupils are equal, round, and reactive to light.  Cardiovascular:     Rate and Rhythm: Normal rate and regular rhythm.     Heart sounds: Normal heart sounds.  Pulmonary:     Effort:  Pulmonary effort is normal.     Breath sounds: Normal breath sounds. No decreased breath sounds, wheezing, rhonchi or rales.     Comments: Frequent hacking cough Abdominal:     Palpations: Abdomen is soft.     Tenderness: There is no abdominal tenderness. There is no guarding or rebound.  Lymphadenopathy:     Cervical: No cervical adenopathy.     Right cervical: No superficial cervical adenopathy.    Left cervical: No superficial cervical adenopathy.  Neurological:     General: No focal deficit present.     Mental Status: She is alert and oriented to person, place, and time.  Psychiatric:        Mood and Affect: Mood normal.        Behavior: Behavior normal.        Thought Content: Thought content normal.        Judgment: Judgment normal.     UC Treatments / Results  Labs (all labs ordered are listed, but only abnormal results are displayed) Labs Reviewed - No data to display  EKG   Radiology No results found.  Procedures Procedures (including critical care time)  Medications Ordered in UC Medications - No data to display  Initial Impression / Assessment and Plan / UC Course  I have reviewed the triage vital signs and the nursing notes.  Pertinent labs & imaging results that were available during my care of the patient were reviewed by me and considered in my medical decision making (see chart for details).     This patient is a very  pleasant 40 y.o. year old female presenting with acute bronchitis and sinusitis. Today this pt is afebrile nontachycardic nontachypneic, oxygenating well on room air, no wheezes rhonchi or rales. She declines Covid and influenza testing.  History lupus and chronic sinusitis. She is already taking doxycycline and prednisone x5 days. She is penicillin allergic so will add z-pack, and she will finish doxycycline. Continue flonase.   I also sent albuterol inhaler and promethazine D.  ED return precautions discussed. Patient verbalizes  understanding and agreement.    Final Clinical Impressions(s) / UC Diagnoses   Final diagnoses:  Acute bronchitis, unspecified organism  Systemic lupus erythematosus, unspecified SLE type, unspecified organ involvement status ([place])  Acute non-recurrent maxillary sinusitis     Discharge Instructions      -You have bronchitis. Bronchitis is an inflammation of the lining of your bronchial tubes, which carry air to and from your lungs. This typically occurs after a virus, like a cold virus. People who have bronchitis often cough up thickened mucus, which can be discolored. This isn't a bacterial infection, so you don't need antibiotics. We treat it with medications to help reduce inflammation and open up your lungs.   -Albuterol inhaler as needed for cough, wheezing, shortness of breath, 1 to 2 puffs every 6 hours as needed. -Promethazine DM cough syrup for congestion/cough. This could make you drowsy, so take at night before bed. -Continue doxycycline as directed -With a virus, you're typically contagious for 5-7 days, or as long as you're having fevers. Follow-up if symptoms getting worse instead of better   ED Prescriptions     Medication Sig Dispense Auth. Provider   azithromycin (ZITHROMAX) 250 MG tablet Take 1 tablet (250 mg total) by mouth daily. Take first 2 tablets together, then 1 every day until finished. 6 tablet Hazel Sams, PA-C   promethazine-dextromethorphan (PROMETHAZINE-DM) 6.25-15 MG/5ML syrup Take 2.5 mLs by mouth 4 (four) times daily as needed for cough. 50 mL Hazel Sams, PA-C   albuterol (VENTOLIN HFA) 108 (90 Base) MCG/ACT inhaler Inhale 1-2 puffs into the lungs every 6 (six) hours as needed for wheezing or shortness of breath. 1 each Hazel Sams, PA-C      PDMP not reviewed this encounter.   Hazel Sams, PA-C 11/15/20 1105

## 2020-11-24 ENCOUNTER — Other Ambulatory Visit: Payer: Self-pay | Admitting: Physician Assistant

## 2020-11-24 DIAGNOSIS — M359 Systemic involvement of connective tissue, unspecified: Secondary | ICD-10-CM

## 2020-11-24 NOTE — Telephone Encounter (Signed)
Next Visit: 03/06/2021  Last Visit: 09/28/2020  Labs: 09/28/2020 MP WNL.  Absolute eosinophils remains borderline elevated-502.  Rest of CBC WNL.  Eye exam: 03/18/2020 WNL    Current Dose per office note 09/28/2020: Plaquenil 200 mg 1 tablet by mouth twice daily Monday through Friday only  FV:[facility] systemic lupus erythematosus with other organ involvement   Last Fill: 08/08/2020  Okay to refill Plaquenil?

## 2021-02-17 NOTE — Progress Notes (Signed)
Office Visit Note  Patient: Emma Franco             Date of Birth: Jul 09, 1980           MRN: [id number]             PCP: Lindell Spar, MD Referring: Lindell Spar, MD Visit Date: 03/02/2021 Occupation: @GUAROCC @  Subjective:  Medication mangement  History of Present Illness: Zina Pitzer is a 41 y.o. female with history of systemic lupus erythematosus.  She states she continues to have fatigue, dry mouth, dry eyes and hair loss.  She has not noticed any recent episodes of raynaud's phenominon, lymphadenopathy, photosensitivity or rash.  She has been taking hydroxychloroquine on a regular basis and has not noticed any side effects from hydroxychloroquine.  She states she still has hematoma from the fall in September 2022.  Her hairdresser noticed a lump on the top of her wrist scalp.  She states the area is somewhat tender.  Activities of Daily Living:  Patient reports morning stiffness for 30 minutes.   Patient Denies nocturnal pain.  Difficulty dressing/grooming: Denies Difficulty climbing stairs: Reports Difficulty getting out of chair: Denies Difficulty using hands for taps, buttons, cutlery, and/or writing: Denies  Review of Systems  Constitutional:  Positive for fatigue.  HENT:  Positive for mouth dryness. Negative for mouth sores and nose dryness.   Eyes:  Positive for dryness. Negative for pain and itching.  Respiratory:  Negative for difficulty breathing.   Cardiovascular:  Negative for chest pain and palpitations.  Gastrointestinal:  Positive for constipation and diarrhea. Negative for blood in stool.  Endocrine: Negative for increased urination.  Genitourinary:  Negative for difficulty urinating.  Musculoskeletal:  Positive for myalgias, morning stiffness and myalgias. Negative for joint pain, joint pain, joint swelling and muscle tenderness.  Skin:  Negative for color change, rash, redness and sensitivity to sunlight.  Allergic/Immunologic:  Negative for susceptible to infections.  Neurological:  Positive for dizziness, headaches and weakness. Negative for numbness and memory loss.       History of vertigo  Hematological:  Positive for bruising/bleeding tendency. Negative for swollen glands.  Psychiatric/Behavioral:  Negative for depressed mood, confusion and sleep disturbance. The patient is not nervous/anxious.    PMFS History:  Patient Active Problem List   Diagnosis Date Noted   Diarrhea 02/26/2020   Viral URI with cough 11/26/2019   Recurrent major depressive disorder, in full remission ([place]) 11/19/2019   Other forms of systemic lupus erythematosus ([place]) 03/05/2019   Autoimmune disease ([place]) 06/26/2017   Scoliosis 05/21/2017   Previous child with congenital anomaly, currently pregnant, antepartum 06/18/2013   Gastroesophageal reflux disease 03/18/2008   HIATAL HERNIA 03/18/2008   Irritable bowel syndrome 02/06/2008   ARTHRITIS 68/12/7515   Periumbilical pain 00/17/4944    Past Medical History:  Diagnosis Date   Allergy    Anxiety    Arthritis    "in my back"   Chronic sinusitis 05/21/2017   s/p surgery, also complicated by CSF leak, which was repaired    Environmental allergies    Esophageal reflux    ESOPHAGITIS 03/18/2008   Qualifier: Diagnosis of  By: Nils Pyle CMA (AAMA), Mearl Latin     Family history of rheumatoid arthritis 05/21/2017   Mother   Headache(784.0) 07/18/11   "qd for the last year; CSF leak; repaired today"   Hiatal hernia    History of miscarriage 06/26/2017   In first trimester   Irritable bowel syndrome  Lupus ([place])    Migraines    "once in a blue moon"   PONV (postoperative nausea and vomiting)    Postpartum care following cesarean delivery (10/9) 10/16/2013   Previous cesarean delivery, delivered 10/19/2013   Scoliosis    mild   Sinusitis, chronic     Family History  Problem Relation Age of Onset   Rheum arthritis Mother    Multiple sclerosis Mother    Breast cancer Mother     Stroke Maternal Grandmother    Stroke Paternal Grandmother    Past Surgical History:  Procedure Laterality Date   CESAREAN SECTION  03/2009   CESAREAN SECTION Bilateral 10/16/2013   Procedure: REPEAT CESAREAN SECTION WITH BILATERAL TUBAL LIGATION ;  Surgeon: Claiborne Billings A. Pamala Hurry, MD;  Location: [facility];  Service: Obstetrics;  Laterality: Bilateral;   EDD: 10/26/13   CHOLECYSTECTOMY  12/1996   NASAL SEPTUM SURGERY  2006   NASAL SINUS SURGERY  03/2010; 07/18/11   REPAIR DURAL / CSF LEAK  07/18/11   SINUS ENDO W/FUSION  07/18/2011   Procedure: ENDOSCOPIC SINUS SURGERY WITH FUSION NAVIGATION;  Surgeon: Ruby Cola, MD;  Location: [facility];  Service: ENT;  Laterality: N/A;   [place] History   Social History Narrative   Lives with husband Legrand [place]- married 45 years    2 girls: Rylee 70 and Stella 6       Dog: Gpys- Double Doodle      Enjoys: hanging out with friends, building a house right now      Diet: eats all food groups    Caffeine: 1-2 cups    Water: 5 bottles       Wears seat belt   Does not use phone while driving   Smoke and carbon Midwife- none    Immunization History  Administered Date(s) Administered   Influenza,inj,Quad PF,6+ Mos 10/18/2019   Influenza-Unspecified 09/30/2017, 09/17/2018   Meningococcal Polysaccharide 07/19/2011   PFIZER(Purple Top)SARS-COV-2 Vaccination 09/03/2019, 09/24/2019   Pneumococcal Conjugate-13 11/11/2017   Pneumococcal Polysaccharide-23 07/19/2011     Objective: Vital Signs: BP 113/80 (BP Location: Left Arm, Patient Position: Sitting, Cuff Size: Large)    Pulse 92    Ht 5\' 5"  (1.651 m)    Wt 187 lb 12.8 oz (85.2 kg)    BMI 31.25 kg/m    Physical Exam Vitals and nursing note reviewed.  Constitutional:      Appearance: She is well-developed.  HENT:     Head: Normocephalic and atraumatic.  Eyes:     Conjunctiva/sclera: Conjunctivae normal.  Cardiovascular:     Rate  and Rhythm: Normal rate and regular rhythm.     Heart sounds: Normal heart sounds.  Pulmonary:     Effort: Pulmonary effort is normal.     Breath sounds: Normal breath sounds.  Abdominal:     General: Bowel sounds are normal.     Palpations: Abdomen is soft.  Musculoskeletal:     Cervical back: Normal range of motion.  Lymphadenopathy:     Cervical: No cervical adenopathy.  Skin:    General: Skin is warm and dry.     Capillary Refill: Capillary refill takes less than 2 seconds.     Comments: hair thinning was noted.  Bruising was noted over her sacral region.  Neurological:     Mental Status: She is alert and oriented to person, place, and time.  Psychiatric:  Behavior: Behavior normal.     Musculoskeletal Exam: C-spine was in good range of motion.  Shoulder joints, elbow joints, wrist joints, MCPs PIPs and DIPs with good range of motion with no synovitis.  Hip joints, knee joints, ankles, MTPs and PIPs with good range of motion with no synovitis.  CDAI Exam: CDAI Score: -- Patient Global: --; Provider Global: -- Swollen: --; Tender: -- Joint Exam 03/02/2021   No joint exam has been documented for this visit   There is currently no information documented on the homunculus. Go to the Rheumatology activity and complete the homunculus joint exam.  Investigation: No additional findings.  Imaging: No results found.  Recent Labs: Lab Results  Component Value Date   WBC 5.7 09/28/2020   HGB 11.8 09/28/2020   PLT 193 09/28/2020   NA 138 09/28/2020   K 4.1 09/28/2020   CL 103 09/28/2020   CO2 28 09/28/2020   GLUCOSE 86 09/28/2020   BUN 9 09/28/2020   CREATININE 0.77 09/28/2020   BILITOT 0.5 09/28/2020   ALKPHOS 131 (H) 09/11/2013   AST 13 09/28/2020   ALT 9 09/28/2020   PROT 6.2 09/28/2020   ALBUMIN 2.7 (L) 09/11/2013   CALCIUM 8.7 09/28/2020   GFRAA 103 07/07/2020   QFTBGOLDPLUS NEGATIVE 03/24/2019    Speciality Comments: PLQ Eye Exam: 03/18/2020 WNL Dr.  Teodoro Spray.  Procedures:  No procedures performed Allergies: Cefdinir, Ciprofloxacin, and Ciprofibrate   Assessment / Plan:     Visit Diagnoses: Other systemic lupus erythematosus with other organ involvement (H[facility]) - ANA positive, dsDNA 48, rest of the ENA negative, C3-C4 normal.  History of fatigue, hair loss, arthralgias, oral ulcers, sicca symptoms and photosensitivity: -She continues to have fatigue, hair loss and sicca symptoms.  Generalized hair thinning was noted.  Use of Rogaine topical solution and Nutrafol was discussed.  Avoiding application of heat to the hair was also discussed.  I will repeat labs today.  Plan: Protein / creatinine ratio, urine, Anti-DNA antibody, double-stranded, C3 and C4, Sedimentation rate  High risk medication use - Plaquenil 200 mg 1 tablet by mouth twice daily Monday through Friday only. PLQ Eye Exam: 03/18/2020 - Plan: CBC with Differential/Platelet, COMPLETE METABOLIC PANEL WITH GFR today.  Information on immunization was also placed in the AVS.  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  Use of sunscreen was discussed.  Other fatigue-she continues to have some fatigue.  Trapezius muscle spasm-she has intermittent trapezius spasm.  Other form of scoliosis of lumbar spine-she denies any discomfort.  Vitamin D deficiency-her vitamin D was normal in 2021.  She has been advised to take vitamin D on a regular basis.  Left hip pain - fall on her left side on 09/26/2020.  She has some bruising over the left buttock region.  No tenderness was noted.  The left hip pain has resolved.  Hematoma -on the left gluteal region has resolved now.  She has some bruising.  History of IBS - She was previously evaluated by Dr. Collene Mares and was started on probiotics which improved her symptoms.   History of anxiety  History of gastroesophageal reflux (GERD)  Hiatal hernia    Orders: Orders Placed This Encounter  Procedures   Protein / creatinine ratio,  urine   CBC with Differential/Platelet   COMPLETE METABOLIC PANEL WITH GFR   Anti-DNA antibody, double-stranded   C3 and C4   Sedimentation rate   No orders of the defined types were placed in this encounter.  Follow-Up Instructions: Return in about 5 months (around 07/30/2021) for Systemic lupus.   Bo Merino, MD  Note - This record has been created using Editor, commissioning.  Chart creation errors have been sought, but may not always  have been located. Such creation errors do not reflect on  the standard of medical care.

## 2021-03-02 ENCOUNTER — Other Ambulatory Visit: Payer: Self-pay

## 2021-03-02 ENCOUNTER — Encounter: Payer: Self-pay | Admitting: Rheumatology

## 2021-03-02 ENCOUNTER — Ambulatory Visit: Payer: 59 | Admitting: Rheumatology

## 2021-03-02 VITALS — BP 113/80 | HR 92 | Ht 65.0 in | Wt 187.8 lb

## 2021-03-02 DIAGNOSIS — M3219 Other organ or system involvement in systemic lupus erythematosus: Secondary | ICD-10-CM

## 2021-03-02 DIAGNOSIS — Z9181 History of falling: Secondary | ICD-10-CM

## 2021-03-02 DIAGNOSIS — Z8659 Personal history of other mental and behavioral disorders: Secondary | ICD-10-CM

## 2021-03-02 DIAGNOSIS — E559 Vitamin D deficiency, unspecified: Secondary | ICD-10-CM

## 2021-03-02 DIAGNOSIS — R5383 Other fatigue: Secondary | ICD-10-CM

## 2021-03-02 DIAGNOSIS — L930 Discoid lupus erythematosus: Secondary | ICD-10-CM

## 2021-03-02 DIAGNOSIS — M25552 Pain in left hip: Secondary | ICD-10-CM

## 2021-03-02 DIAGNOSIS — M4186 Other forms of scoliosis, lumbar region: Secondary | ICD-10-CM

## 2021-03-02 DIAGNOSIS — Z79899 Other long term (current) drug therapy: Secondary | ICD-10-CM | POA: Diagnosis not present

## 2021-03-02 DIAGNOSIS — M62838 Other muscle spasm: Secondary | ICD-10-CM

## 2021-03-02 DIAGNOSIS — K449 Diaphragmatic hernia without obstruction or gangrene: Secondary | ICD-10-CM

## 2021-03-02 DIAGNOSIS — Z8719 Personal history of other diseases of the digestive system: Secondary | ICD-10-CM

## 2021-03-02 DIAGNOSIS — T148XXA Other injury of unspecified body region, initial encounter: Secondary | ICD-10-CM

## 2021-03-02 DIAGNOSIS — U071 COVID-19: Secondary | ICD-10-CM

## 2021-03-02 NOTE — Patient Instructions (Signed)
Vaccines You are taking a medication(s) that can suppress your immune system.  The following immunizations are recommended: Flu annually Covid-19  Td/Tdap (tetanus, diphtheria, pertussis) every 10 years Pneumonia (Prevnar 15 then Pneumovax 23 at least 1 year apart.  Alternatively, can take Prevnar 20 without needing additional dose) Shingrix: 2 doses from 4 weeks to 6 months apart  Please check with your PCP to make sure you are up to date.  

## 2021-03-03 LAB — COMPLETE METABOLIC PANEL WITH GFR
AG Ratio: 1.4 (calc) (ref 1.0–2.5)
ALT: 11 U/L (ref 6–29)
AST: 16 U/L (ref 10–30)
Albumin: 4 g/dL (ref 3.6–5.1)
Alkaline phosphatase (APISO): 55 U/L (ref 31–125)
BUN: 10 mg/dL (ref 7–25)
CO2: 28 mmol/L (ref 20–32)
Calcium: 9.1 mg/dL (ref 8.6–10.2)
Chloride: 103 mmol/L (ref 98–110)
Creat: 0.84 mg/dL (ref 0.50–0.99)
Globulin: 2.9 g/dL (calc) (ref 1.9–3.7)
Glucose, Bld: 81 mg/dL (ref 65–99)
Potassium: 4.2 mmol/L (ref 3.5–5.3)
Sodium: 136 mmol/L (ref 135–146)
Total Bilirubin: 0.6 mg/dL (ref 0.2–1.2)
Total Protein: 6.9 g/dL (ref 6.1–8.1)
eGFR: 89 mL/min/{1.73_m2} (ref >=60)

## 2021-03-03 LAB — CBC WITH DIFFERENTIAL/PLATELET
Absolute Monocytes: 673 cells/uL (ref 200–950)
Basophils Absolute: 83 cells/uL (ref 0–200)
Basophils Relative: 1.4 %
Eosinophils Absolute: 602 cells/uL — ABNORMAL HIGH (ref 15–500)
Eosinophils Relative: 10.2 %
HCT: 43.2 % (ref 35.0–45.0)
Hemoglobin: 14.2 g/dL (ref 11.7–15.5)
Lymphs Abs: 1853 cells/uL (ref 850–3900)
MCH: 28.2 pg (ref 27.0–33.0)
MCHC: 32.9 g/dL (ref 32.0–36.0)
MCV: 85.9 fL (ref 80.0–100.0)
MPV: 10.6 fL (ref 7.5–12.5)
Monocytes Relative: 11.4 %
Neutro Abs: 2690 cells/uL (ref 1500–7800)
Neutrophils Relative %: 45.6 %
Platelets: 243 10*3/uL (ref 140–400)
RBC: 5.03 10*6/uL (ref 3.80–5.10)
RDW: 13.1 % (ref 11.0–15.0)
Total Lymphocyte: 31.4 %
WBC: 5.9 10*3/uL (ref 3.8–10.8)

## 2021-03-03 LAB — PROTEIN / CREATININE RATIO, URINE
Creatinine, Urine: 60 mg/dL (ref 20–275)
Protein/Creat Ratio: 67 mg/g creat (ref 24–184)
Protein/Creatinine Ratio: 0.067 mg/mg creat (ref 0.024–0.184)
Total Protein, Urine: 4 mg/dL — ABNORMAL LOW (ref 5–24)

## 2021-03-03 LAB — C3 AND C4
C3 Complement: 105 mg/dL (ref 83–193)
C4 Complement: 32 mg/dL (ref 15–57)

## 2021-03-03 LAB — ANTI-DNA ANTIBODY, DOUBLE-STRANDED: ds DNA Ab: 14 IU/mL — ABNORMAL HIGH

## 2021-03-03 LAB — SEDIMENTATION RATE: Sed Rate: 2 mm/h (ref 0–20)

## 2021-03-03 NOTE — Progress Notes (Signed)
CBC, CMP, sed rate, complements, UA are within normal limits.  ds DNA is positive and stable.  Labs do not indicate disease flare.  No change in treatment advised.

## 2021-03-06 ENCOUNTER — Ambulatory Visit: Payer: 59 | Admitting: Rheumatology

## 2021-03-14 ENCOUNTER — Other Ambulatory Visit: Payer: Self-pay | Admitting: Physician Assistant

## 2021-03-14 DIAGNOSIS — M359 Systemic involvement of connective tissue, unspecified: Secondary | ICD-10-CM

## 2021-03-14 NOTE — Telephone Encounter (Signed)
Next Visit: 07/31/2021 ? ?Last Visit: 03/02/2021 ? ?Labs: 03/02/2021 CBC, CMP, sed rate, complements, UA are within normal limits.   ? ?Eye exam: 03/18/2020 WNL  ? ?Current Dose per office note 03/02/2021: Plaquenil 200 mg 1 tablet by mouth twice daily Monday through Friday only ? ?DX: Other systemic lupus erythematosus with other organ involvement  ? ?Last Fill: 11/24/2020 ? ?Okay to refill Plaquenil?  ?

## 2021-04-07 ENCOUNTER — Encounter: Payer: Self-pay | Admitting: Rheumatology

## 2021-06-21 ENCOUNTER — Other Ambulatory Visit: Payer: Self-pay | Admitting: Physician Assistant

## 2021-06-21 DIAGNOSIS — M359 Systemic involvement of connective tissue, unspecified: Secondary | ICD-10-CM

## 2021-06-21 NOTE — Telephone Encounter (Signed)
Next Visit: 07/31/2021   Last Visit: 03/02/2021   Labs: 03/02/2021 CBC, CMP, sed rate, complements, UA are within normal limits.     Eye exam: 04/28/2021 WNL    Current Dose per office note 03/02/2021: Plaquenil 200 mg 1 tablet by mouth twice daily Monday through Friday only   DX: Other systemic lupus erythematosus with other organ involvement    Last Fill: 03/14/2021   Okay to refill Plaquenil?

## 2021-07-17 NOTE — Progress Notes (Signed)
Office Visit Note  Patient: Emma Franco             Date of Birth: 04/01/1980           MRN: [id number]             PCP: Lindell Spar, MD Referring: Lindell Spar, MD Visit Date: 07/31/2021 Occupation: '@GUAROCC' @  Subjective:  Fatigue   History of Present Illness: Emma Franco is a 41 y.o. female with history of systemic lupus and discoid lupus. She is taking plaquenil 200 mg 1 tablet by mouth twice daily Monday through Friday. She continues to tolerate plaquenil without any side effects.  She denies any new or worsening of symptoms since her last office visit.  She continues to experience fatigue intermittently, ongoing hair loss, and myalgias.  She has trapezius muscle tension and tenderness bilaterally.  She has been seeing a chiropractor every 2 weeks and has had a massage on a monthly basis to alleviate her discomfort.  She denies any increased joint pain or joint swelling.  She has not had any recent discoid lesions.  She denies any symptoms of Raynaud's.  She has not had any sores in her mouth or nose.  She continues to have chronic dry eyes and uses Restasis for symptomatic relief.     Activities of Daily Living:  Patient reports morning stiffness for 30 minutes.   Patient Denies nocturnal pain.  Difficulty dressing/grooming: Denies Difficulty climbing stairs: Reports Difficulty getting out of chair: Reports Difficulty using hands for taps, buttons, cutlery, and/or writing: Denies  Review of Systems  Constitutional:  Positive for fatigue.  HENT:  Positive for mouth dryness. Negative for mouth sores and nose dryness.   Eyes:  Positive for dryness. Negative for pain and visual disturbance.  Respiratory:  Negative for cough, hemoptysis, shortness of breath and difficulty breathing.   Cardiovascular:  Negative for chest pain, palpitations, hypertension and swelling in legs/feet.  Gastrointestinal:  Positive for constipation and diarrhea. Negative for blood  in stool.  Endocrine: Negative for increased urination.  Genitourinary:  Negative for painful urination and involuntary urination.  Musculoskeletal:  Positive for myalgias, morning stiffness, muscle tenderness and myalgias. Negative for joint pain, joint pain, joint swelling and muscle weakness.  Skin:  Positive for hair loss and sensitivity to sunlight. Negative for color change, pallor, rash, nodules/bumps, skin tightness and ulcers.  Allergic/Immunologic: Negative for susceptible to infections.  Neurological:  Negative for numbness and weakness.  Hematological:  Negative for swollen glands.  Psychiatric/Behavioral:  Positive for depressed mood and sleep disturbance. The patient is nervous/anxious.     PMFS History:  Patient Active Problem List   Diagnosis Date Noted   Diarrhea 02/26/2020   Viral URI with cough 11/26/2019   Recurrent major depressive disorder, in full remission ([place]) 11/19/2019   Other forms of systemic lupus erythematosus ([place]) 03/05/2019   Autoimmune disease ([place]) 06/26/2017   Scoliosis 05/21/2017   Previous child with congenital anomaly, currently pregnant, antepartum 06/18/2013   Gastroesophageal reflux disease 03/18/2008   HIATAL HERNIA 03/18/2008   Irritable bowel syndrome 02/06/2008   ARTHRITIS 19/41/7408   Periumbilical pain 14/48/1856    Past Medical History:  Diagnosis Date   Allergy    Anxiety    Arthritis    "in my back"   Chronic sinusitis 05/21/2017   s/p surgery, also complicated by CSF leak, which was repaired    Environmental allergies    Esophageal reflux    ESOPHAGITIS 03/18/2008  Qualifier: Diagnosis of  By: Nils Pyle CMA Deborra Medina), Mearl Latin     Family history of rheumatoid arthritis 05/21/2017   Mother   Headache(784.0) 07/18/11   "qd for the last year; CSF leak; repaired today"   Hiatal hernia    History of miscarriage 06/26/2017   In first trimester   Irritable bowel syndrome    Lupus ([place])    Migraines    "once in a blue moon"   PONV  (postoperative nausea and vomiting)    Postpartum care following cesarean delivery (10/9) 10/16/2013   Previous cesarean delivery, delivered 10/19/2013   Scoliosis    mild   Sinusitis, chronic     Family History  Problem Relation Age of Onset   Rheum arthritis Mother    Multiple sclerosis Mother    Breast cancer Mother    Stroke Maternal Grandmother    Stroke Paternal Grandmother    Past Surgical History:  Procedure Laterality Date   CESAREAN SECTION  03/2009   CESAREAN SECTION Bilateral 10/16/2013   Procedure: REPEAT CESAREAN SECTION WITH BILATERAL TUBAL LIGATION ;  Surgeon: Claiborne Billings A. Pamala Hurry, MD;  Location: [facility];  Service: Obstetrics;  Laterality: Bilateral;   EDD: 10/26/13   CHOLECYSTECTOMY  12/1996   NASAL SEPTUM SURGERY  2006   NASAL SINUS SURGERY  03/2010; 07/18/11   REPAIR DURAL / CSF LEAK  07/18/11   SINUS ENDO W/FUSION  07/18/2011   Procedure: ENDOSCOPIC SINUS SURGERY WITH FUSION NAVIGATION;  Surgeon: Ruby Cola, MD;  Location: [facility];  Service: ENT;  Laterality: N/A;   [place] History   Social History Narrative   Lives with husband Legrand Como- married 58 years    2 girls: Rylee 10 and Stella 6       Dog: Gpys- Double Doodle      Enjoys: hanging out with friends, building a house right now      Diet: eats all food groups    Caffeine: 1-2 cups    Water: 5 bottles       Wears seat belt   Does not use phone while driving   Smoke and carbon Midwife- none    Immunization History  Administered Date(s) Administered   Influenza,inj,Quad PF,6+ Mos 10/18/2019   Influenza-Unspecified 09/30/2017, 09/17/2018   Meningococcal Polysaccharide 07/19/2011   PFIZER(Purple Top)SARS-COV-2 Vaccination 09/03/2019, 09/24/2019   Pneumococcal Conjugate-13 11/11/2017   Pneumococcal Polysaccharide-23 07/19/2011     Objective: Vital Signs: BP (!) 131/91 (BP Location: Left Arm, Patient Position: Sitting, Cuff  Size: Normal)   Pulse 94   Ht '5\' 5"'  (1.651 m)   Wt 197 lb 12.8 oz (89.7 kg)   BMI 32.92 kg/m    Physical Exam Vitals and nursing note reviewed.  Constitutional:      Appearance: She is well-developed.  HENT:     Head: Normocephalic and atraumatic.  Eyes:     Conjunctiva/sclera: Conjunctivae normal.  Cardiovascular:     Rate and Rhythm: Normal rate and regular rhythm.     Heart sounds: Normal heart sounds.  Pulmonary:     Effort: Pulmonary effort is normal.     Breath sounds: Normal breath sounds.  Abdominal:     General: Bowel sounds are normal.     Palpations: Abdomen is soft.  Musculoskeletal:     Cervical back: Normal range of motion.  Skin:    General: Skin is warm and dry.  Capillary Refill: Capillary refill takes less than 2 seconds.  Neurological:     Mental Status: She is alert and oriented to person, place, and time.  Psychiatric:        Behavior: Behavior normal.      Musculoskeletal Exam: C-spine has limited ROM.  Trapezius muscle tension and tenderness bilaterally.  Thoracolumbar scoliosis.  Shoulder joints, elbow joints, wrist joints, Mcps, PIPs, and DIPs good ROM with no synovitis.  Complete fist formation bilaterally.  Hip joints have good ROM with no groin pain.  Knee joints have good ROM with no warmth or effusion.  Ankle joints have good ROM with no tenderness or joint swelling.   CDAI Exam: CDAI Score: -- Patient Global: --; Provider Global: -- Swollen: --; Tender: -- Joint Exam 07/31/2021   No joint exam has been documented for this visit   There is currently no information documented on the homunculus. Go to the Rheumatology activity and complete the homunculus joint exam.  Investigation: No additional findings.  Imaging: No results found.  Recent Labs: Lab Results  Component Value Date   WBC 5.9 03/02/2021   HGB 14.2 03/02/2021   PLT 243 03/02/2021   NA 136 03/02/2021   K 4.2 03/02/2021   CL 103 03/02/2021   CO2 28 03/02/2021    GLUCOSE 81 03/02/2021   BUN 10 03/02/2021   CREATININE 0.84 03/02/2021   BILITOT 0.6 03/02/2021   ALKPHOS 131 (H) 09/11/2013   AST 16 03/02/2021   ALT 11 03/02/2021   PROT 6.9 03/02/2021   ALBUMIN 2.7 (L) 09/11/2013   CALCIUM 9.1 03/02/2021   GFRAA 103 07/07/2020   QFTBGOLDPLUS NEGATIVE 03/24/2019    Speciality Comments: PLQ Eye Exam: 04/28/2021 WNL Dr. Teodoro Spray f/u 12 months  Procedures:  Trigger Point Inj  Date/Time: 07/31/2021 8:33 AM  Performed by: Ofilia Neas, PA-C Authorized by: Ofilia Neas, PA-C   Consent Given by:  Patient Site marked: the procedure site was marked   Timeout: prior to procedure the correct patient, procedure, and site was verified   Indications:  Pain Total # of Trigger Points:  2 Location: neck   Needle Size:  27 G Approach:  Dorsal Medications #1:  0.5 mL lidocaine 1 %; 10 mg triamcinolone acetonide 40 MG/ML Medications #2:  0.5 mL lidocaine 1 %; 10 mg triamcinolone acetonide 40 MG/ML Patient tolerance:  Patient tolerated the procedure well with no immediate complications  Allergies: Cefdinir, Ciprofloxacin, and Ciprofibrate    Assessment / Plan:     Visit Diagnoses: Other systemic lupus erythematosus with other organ involvement (HCC) - ANA positive, dsDNA 48, rest of the ENA negative, C3-C4 normal.  History of fatigue, hair loss, arthralgias, oral ulcers, sicca symptoms and photosensitivity: She has not had any signs or symptoms of a systemic lupus flare recently.  She has clinically been doing well taking Plaquenil 200 mg 1 tablet by mouth twice daily Monday through Friday.  She continues to tolerate Plaquenil without any side effects and has not missed any doses recently.  Lab work from 03/02/21 was reviewed today in the office: ESR Wnl, complements WNL, dsDNA 14 (trending down), CMP WNL, WBC count WNL, hgb 14.2, plt 243, and no proteinuria.   No discoid lesions or Malar rash noted.  She continues to have diffuse hair thinning and has  tried biotin and neutrophil in the past.  She is planning on following up with her dermatologist if her hair loss persists or worsens.  She has not had any  symptoms of Raynaud's and no digital ulcerations or signs of sclerodactyly were noted.  She has not had any oral or nasal ulcerations.  She has chronic dry eyes and has been using Restasis eyedrops for symptomatic relief.  No cervical lymphadenopathy.  She has not had any shortness of breath, pleuritic chest pain, or palpitations. She continues to experience fatigue and myalgias especially with overuse activities.  Discussed the importance of regular exercise and good sleep hygiene. She will remain on the current dose of Plaquenil.  She was advised to notify us if she develops signs or symptoms of a flare.  She will follow-up in the office in 5 months or sooner if needed.   - Plan: CBC with Differential/Platelet, Urinalysis, Routine w reflex microscopic, COMPLETE METABOLIC PANEL WITH GFR, Anti-DNA antibody, double-stranded, C3 and C4, Sedimentation rate  High risk medication use - Plaquenil 200 mg 1 tablet by mouth twice daily Monday through Friday only.  PLQ Eye Exam: 04/28/2021 WNL Dr. Teodoro Spray f/u 12 months. CBC and CMP updated on 03/02/21.  Orders for CBC and CMP were released today.   Plan: CBC with Differential/Platelet, COMPLETE METABOLIC PANEL WITH GFR  Discoid lupus: No discoid lesions recently.  She has been trying to avoid direct sun exposure and wearing sunscreen if she plans on being outdoors.  She has not seen her dermatologist recently.  Other fatigue: She continues to experience fatigue on a daily basis.  Discussed the importance of regular exercise and good sleep hygiene.  Trapezius muscle spasm: She has trapezius muscle tension and tenderness bilaterally.  She experiences muscle spasms intermittently.  She has been seeing her chiropractor every 2 weeks and has had a massage on a monthly basis to alleviate her symptoms.  In the  past she has had temporary relief from trigger point injections.  She requested bilateral trapezius trigger point injections today.  She tolerated the procedures well.  Procedure notes were completed above.  Aftercare was discussed.  Other form of scoliosis of lumbar spine:She has some right paraspinal muscle soreness intermittently.  No symptoms of radiculopathy.   Vitamin D deficiency -Vitamin D level will be updated today.  Plan: VITAMIN D 25 Hydroxy (Vit-D Deficiency, Fractures)  Other medical conditions are listed as follows:   Left hip pain - S/p fall on her left side on 09/26/2020. The left hip pain has resolved.  Hematoma - Resolved.   History of anxiety  History of IBS - She was previously evaluated by Dr. Collene Mares and was started on probiotics which improved her symptoms.   History of gastroesophageal reflux (GERD)  Hiatal hernia  Orders: Orders Placed This Encounter  Procedures   Trigger Point Inj   CBC with Differential/Platelet   Urinalysis, Routine w reflex microscopic   COMPLETE METABOLIC PANEL WITH GFR   Anti-DNA antibody, double-stranded   C3 and C4   Sedimentation rate   VITAMIN D 25 Hydroxy (Vit-D Deficiency, Fractures)   No orders of the defined types were placed in this encounter.   Follow-Up Instructions: Return in about 5 months (around 12/31/2021) for Systemic lupus erythematosus.   Ofilia Neas, PA-C  Note - This record has been created using Dragon software.  Chart creation errors have been sought, but may not always  have been located. Such creation errors do not reflect on  the standard of medical care.

## 2021-07-31 ENCOUNTER — Ambulatory Visit (INDEPENDENT_AMBULATORY_CARE_PROVIDER_SITE_OTHER): Payer: 59 | Admitting: Physician Assistant

## 2021-07-31 ENCOUNTER — Encounter: Payer: Self-pay | Admitting: Physician Assistant

## 2021-07-31 ENCOUNTER — Ambulatory Visit: Payer: Self-pay

## 2021-07-31 VITALS — BP 131/91 | HR 94 | Ht 65.0 in | Wt 197.8 lb

## 2021-07-31 DIAGNOSIS — R5383 Other fatigue: Secondary | ICD-10-CM

## 2021-07-31 DIAGNOSIS — M25552 Pain in left hip: Secondary | ICD-10-CM

## 2021-07-31 DIAGNOSIS — T148XXA Other injury of unspecified body region, initial encounter: Secondary | ICD-10-CM

## 2021-07-31 DIAGNOSIS — M3219 Other organ or system involvement in systemic lupus erythematosus: Secondary | ICD-10-CM | POA: Diagnosis not present

## 2021-07-31 DIAGNOSIS — K449 Diaphragmatic hernia without obstruction or gangrene: Secondary | ICD-10-CM

## 2021-07-31 DIAGNOSIS — L930 Discoid lupus erythematosus: Secondary | ICD-10-CM

## 2021-07-31 DIAGNOSIS — Z8719 Personal history of other diseases of the digestive system: Secondary | ICD-10-CM

## 2021-07-31 DIAGNOSIS — M62838 Other muscle spasm: Secondary | ICD-10-CM | POA: Diagnosis not present

## 2021-07-31 DIAGNOSIS — E559 Vitamin D deficiency, unspecified: Secondary | ICD-10-CM

## 2021-07-31 DIAGNOSIS — Z8659 Personal history of other mental and behavioral disorders: Secondary | ICD-10-CM

## 2021-07-31 DIAGNOSIS — M4186 Other forms of scoliosis, lumbar region: Secondary | ICD-10-CM

## 2021-07-31 DIAGNOSIS — Z79899 Other long term (current) drug therapy: Secondary | ICD-10-CM

## 2021-07-31 MED ORDER — TRIAMCINOLONE ACETONIDE 40 MG/ML IJ SUSP
10.0000 mg | INTRAMUSCULAR | Status: AC | PRN
  Administered 2021-07-31: 10 mg via INTRAMUSCULAR

## 2021-07-31 MED ORDER — LIDOCAINE HCL 1 % IJ SOLN
0.5000 mL | INTRAMUSCULAR | Status: AC | PRN
  Administered 2021-07-31: .5 mL

## 2021-08-01 LAB — CBC WITH DIFFERENTIAL/PLATELET
Absolute Monocytes: 424 cells/uL (ref 200–950)
Basophils Absolute: 53 cells/uL (ref 0–200)
Basophils Relative: 1 %
Eosinophils Absolute: 493 cells/uL (ref 15–500)
Eosinophils Relative: 9.3 %
HCT: 40.7 % (ref 35.0–45.0)
Hemoglobin: 13.3 g/dL (ref 11.7–15.5)
Lymphs Abs: 1299 cells/uL (ref 850–3900)
MCH: 29 pg (ref 27.0–33.0)
MCHC: 32.7 g/dL (ref 32.0–36.0)
MCV: 88.9 fL (ref 80.0–100.0)
MPV: 10 fL (ref 7.5–12.5)
Monocytes Relative: 8 %
Neutro Abs: 3032 cells/uL (ref 1500–7800)
Neutrophils Relative %: 57.2 %
Platelets: 234 10*3/uL (ref 140–400)
RBC: 4.58 10*6/uL (ref 3.80–5.10)
RDW: 13.2 % (ref 11.0–15.0)
Total Lymphocyte: 24.5 %
WBC: 5.3 10*3/uL (ref 3.8–10.8)

## 2021-08-01 LAB — COMPLETE METABOLIC PANEL WITH GFR
AG Ratio: 1.2 (calc) (ref 1.0–2.5)
ALT: 10 U/L (ref 6–29)
AST: 14 U/L (ref 10–30)
Albumin: 3.6 g/dL (ref 3.6–5.1)
Alkaline phosphatase (APISO): 52 U/L (ref 31–125)
BUN: 7 mg/dL (ref 7–25)
CO2: 26 mmol/L (ref 20–32)
Calcium: 8.3 mg/dL — ABNORMAL LOW (ref 8.6–10.2)
Chloride: 105 mmol/L (ref 98–110)
Creat: 0.78 mg/dL (ref 0.50–0.99)
Globulin: 2.9 g/dL (calc) (ref 1.9–3.7)
Glucose, Bld: 92 mg/dL (ref 65–99)
Potassium: 4 mmol/L (ref 3.5–5.3)
Sodium: 139 mmol/L (ref 135–146)
Total Bilirubin: 0.2 mg/dL (ref 0.2–1.2)
Total Protein: 6.5 g/dL (ref 6.1–8.1)
eGFR: 97 mL/min/{1.73_m2} (ref >=60)

## 2021-08-01 LAB — URINALYSIS, ROUTINE W REFLEX MICROSCOPIC
Bacteria, UA: NONE SEEN /HPF
Bilirubin Urine: NEGATIVE
Glucose, UA: NEGATIVE
Hyaline Cast: NONE SEEN /LPF
Ketones, ur: NEGATIVE
Nitrite: NEGATIVE
Protein, ur: NEGATIVE
RBC / HPF: NONE SEEN /HPF (ref 0–2)
Specific Gravity, Urine: 1.009 (ref 1.001–1.035)
pH: 7 (ref 5.0–8.0)

## 2021-08-01 LAB — C3 AND C4
C3 Complement: 114 mg/dL (ref 83–193)
C4 Complement: 36 mg/dL (ref 15–57)

## 2021-08-01 LAB — ANTI-DNA ANTIBODY, DOUBLE-STRANDED: ds DNA Ab: 18 IU/mL — ABNORMAL HIGH

## 2021-08-01 LAB — MICROSCOPIC MESSAGE

## 2021-08-01 LAB — SEDIMENTATION RATE: Sed Rate: 2 mm/h (ref 0–20)

## 2021-08-01 LAB — VITAMIN D 25 HYDROXY (VIT D DEFICIENCY, FRACTURES): Vit D, 25-Hydroxy: 60 ng/mL (ref 30–100)

## 2021-08-01 NOTE — Progress Notes (Signed)
CBC WNL. Calcium is borderline low-8.3. please encourage her to increase her calcium intake. rest of CMP WNL.   Vitamin D WNL.   UA 2+ leukocytes.  Trace hgb.  Please clarify if she is currently on her menstrual cycle.   ESR WNL. Complements WNL.

## 2021-08-02 NOTE — Progress Notes (Signed)
dsDNA is positive and has gone up slightly.  Please notify the patient. Please advise her to notify us if she develops any new or worsening symptoms.

## 2021-10-05 ENCOUNTER — Other Ambulatory Visit: Payer: Self-pay | Admitting: Physician Assistant

## 2021-10-05 DIAGNOSIS — M359 Systemic involvement of connective tissue, unspecified: Secondary | ICD-10-CM

## 2021-10-05 NOTE — Telephone Encounter (Signed)
Next Visit: 02/02/2022  Last Visit: 07/31/2021  Labs: 07/31/2021 CBC WNL. Calcium is borderline low-8.3. rest of CMP WNL.  Eye exam: 04/28/2021 WNL    Current Dose per office note 07/31/2021: Plaquenil 200 mg 1 tablet by mouth twice daily Monday through Friday only  GG:EZMOQ systemic lupus erythematosus with other organ involvement   Last Fill: 06/21/2021  Okay to refill Plaquenil?

## 2021-12-20 ENCOUNTER — Other Ambulatory Visit: Payer: Self-pay | Admitting: Physician Assistant

## 2021-12-20 DIAGNOSIS — M359 Systemic involvement of connective tissue, unspecified: Secondary | ICD-10-CM

## 2021-12-20 NOTE — Telephone Encounter (Signed)
Next Visit: 02/02/2022   Last Visit: 07/31/2021   Labs: 07/31/2021 CBC WNL. Calcium is borderline low-8.3. rest of CMP WNL.   Eye exam: 04/28/2021 WNL     Current Dose per office note 07/31/2021: Plaquenil 200 mg 1 tablet by mouth twice daily Monday through Friday only   OM:[facility] systemic lupus erythematosus with other organ involvement    Last Fill: 10/05/2021   Okay to refill Plaquenil?

## 2022-01-19 NOTE — Progress Notes (Signed)
Office Visit Note  Patient: Emma Franco             Date of Birth: 1980-12-11           MRN: [id number]             PCP: Lindell Spar, MD Referring: Lindell Spar, MD Visit Date: 02/02/2022 Occupation: '@GUAROCC'$ @  Subjective:  Medication management  History of Present Illness: Ernesto Lashway is a 42 y.o. female history of systemic lupus.  She denies having a lupus flare since the last visit.  She continues to have dry mouth and dry eyes.  She also complains of hair loss.  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  She gives history of fatigue.  She denies any joint pain or joint swelling.  She continues to have some muscle pain which she describes in the trapezius region in the lower back.  She states she has some sinus stuffiness which causes dizziness.  She has been taking hydroxychloroquine 200 mg p.o. twice daily Monday to Friday on a regular basis.  She denies any history of oral ulcers, nasal ulcers, malar rash, Raynaud's phenomenon or lymphadenopathy.  She denies history of shortness of breath.    Activities of Daily Living:  Patient reports morning stiffness for 30 minutes.   Patient Denies nocturnal pain.  Difficulty dressing/grooming: Denies Difficulty climbing stairs: Denies Difficulty getting out of chair: Denies Difficulty using hands for taps, buttons, cutlery, and/or writing: Denies  Review of Systems  Constitutional:  Positive for fatigue.  HENT:  Positive for mouth dryness. Negative for mouth sores.   Eyes:  Positive for dryness.  Respiratory:  Negative for shortness of breath.   Cardiovascular:  Negative for chest pain and palpitations.  Gastrointestinal:  Negative for blood in stool, constipation and diarrhea.  Endocrine: Negative for increased urination.  Genitourinary:  Negative for difficulty urinating.  Musculoskeletal:  Positive for myalgias, morning stiffness and myalgias. Negative for joint pain, joint pain and joint swelling.   Skin:  Negative for color change, rash and sensitivity to sunlight.  Allergic/Immunologic: Negative for susceptible to infections.  Neurological:  Positive for headaches.  Hematological:  Negative for swollen glands.  Psychiatric/Behavioral:  Positive for sleep disturbance. Negative for depressed mood. The patient is nervous/anxious.     PMFS History:  Patient Active Problem List   Diagnosis Date Noted   Diarrhea 02/26/2020   Viral URI with cough 11/26/2019   Recurrent major depressive disorder, in full remission ([place]) 11/19/2019   Other forms of systemic lupus erythematosus ([place]) 03/05/2019   Autoimmune disease ([place]) 06/26/2017   Scoliosis 05/21/2017   Previous child with congenital anomaly, currently pregnant, antepartum 06/18/2013   Gastroesophageal reflux disease 03/18/2008   HIATAL HERNIA 03/18/2008   Irritable bowel syndrome 02/06/2008   ARTHRITIS 12/75/1700   Periumbilical pain 17/49/4496    Past Medical History:  Diagnosis Date   Allergy    Anxiety    Arthritis    "in my back"   Chronic sinusitis 05/21/2017   s/p surgery, also complicated by CSF leak, which was repaired    Environmental allergies    Esophageal reflux    ESOPHAGITIS 03/18/2008   Qualifier: Diagnosis of  By: Nils Pyle CMA (AAMA), Mearl Latin     Family history of rheumatoid arthritis 05/21/2017   Mother   Headache(784.0) 07/18/11   "qd for the last year; CSF leak; repaired today"   Hiatal hernia    History of miscarriage 06/26/2017  In first trimester   Irritable bowel syndrome    Lupus (HCC)    Migraines    "once in a blue moon"   PONV (postoperative nausea and vomiting)    Postpartum care following cesarean delivery (10/9) 10/16/2013   Previous cesarean delivery, delivered 10/19/2013   Scoliosis    mild   Sinusitis, chronic     Family History  Problem Relation Age of Onset   Rheum arthritis Mother    Multiple sclerosis Mother    Breast cancer Mother    Stroke Maternal Grandmother    Stroke  Paternal Grandmother    Past Surgical History:  Procedure Laterality Date   CESAREAN SECTION  03/2009   CESAREAN SECTION Bilateral 10/16/2013   Procedure: REPEAT CESAREAN SECTION WITH BILATERAL TUBAL LIGATION ;  Surgeon: Claiborne Billings A. Pamala Hurry, MD;  Location: [facility];  Service: Obstetrics;  Laterality: Bilateral;   EDD: 10/26/13   CHOLECYSTECTOMY  12/1996   NASAL SEPTUM SURGERY  2006   NASAL SINUS SURGERY  03/2010; 07/18/11   REPAIR DURAL / CSF LEAK  07/18/11   SINUS ENDO W/FUSION  07/18/2011   Procedure: ENDOSCOPIC SINUS SURGERY WITH FUSION NAVIGATION;  Surgeon: Ruby Cola, MD;  Location: [facility];  Service: ENT;  Laterality: N/A;   [place] History   Social History Narrative   Lives with husband Legrand [place]- married 75 years    2 girls: Rylee 51 and Stella 6       Dog: Gpys- Double Doodle      Enjoys: hanging out with friends, building a house right now      Diet: eats all food groups    Caffeine: 1-2 cups    Water: 5 bottles       Wears seat belt   Does not use phone while driving   Smoke and carbon Midwife- none    Immunization History  Administered Date(s) Administered   Influenza,inj,Quad PF,6+ Mos 10/18/2019   Influenza-Unspecified 09/30/2017, 09/17/2018   Meningococcal polysaccharide vaccine (MPSV4) 07/19/2011   PFIZER(Purple Top)SARS-COV-2 Vaccination 09/03/2019, 09/24/2019   Pneumococcal Conjugate-13 11/11/2017   Pneumococcal Polysaccharide-23 07/19/2011     Objective: Vital Signs: BP 123/85 (BP Location: Left Arm, Patient Position: Sitting, Cuff Size: Normal)   Pulse 98   Resp 14   Ht '5\' 5"'$  (1.651 m)   Wt 199 lb (90.3 kg)   BMI 33.12 kg/m    Physical Exam Vitals and nursing note reviewed.  Constitutional:      Appearance: She is well-developed.  HENT:     Head: Normocephalic and atraumatic.  Eyes:     Conjunctiva/sclera: Conjunctivae normal.  Cardiovascular:     Rate and Rhythm: Normal  rate and regular rhythm.     Heart sounds: Normal heart sounds.  Pulmonary:     Effort: Pulmonary effort is normal.     Breath sounds: Normal breath sounds.  Abdominal:     General: Bowel sounds are normal.     Palpations: Abdomen is soft.  Musculoskeletal:     Cervical back: Normal range of motion.  Lymphadenopathy:     Cervical: No cervical adenopathy.  Skin:    General: Skin is warm and dry.     Capillary Refill: Capillary refill takes less than 2 seconds.  Neurological:     Mental Status: She is alert and oriented to person, place, and time.  Psychiatric:        Behavior:  Behavior normal.      Musculoskeletal Exam: Cervical, thoracic and lumbar spine were in good range of motion.  She had tenderness over bilateral trapezius region.  Shoulder joints, elbow joints, wrist joints, MCPs PIPs and DIPs been good range of motion with no synovitis.  Hip joints, knee joints, ankles, MTPs and PIPs with good range of motion with no synovitis.  CDAI Exam: CDAI Score: -- Patient Global: --; Provider Global: -- Swollen: --; Tender: -- Joint Exam 02/02/2022   No joint exam has been documented for this visit   There is currently no information documented on the homunculus. Go to the Rheumatology activity and complete the homunculus joint exam.  Investigation: No additional findings.  Imaging: No results found.  Recent Labs: Lab Results  Component Value Date   WBC 5.3 07/31/2021   HGB 13.3 07/31/2021   PLT 234 07/31/2021   NA 139 07/31/2021   K 4.0 07/31/2021   CL 105 07/31/2021   CO2 26 07/31/2021   GLUCOSE 92 07/31/2021   BUN 7 07/31/2021   CREATININE 0.78 07/31/2021   BILITOT 0.2 07/31/2021   ALKPHOS 131 (H) 09/11/2013   AST 14 07/31/2021   ALT 10 07/31/2021   PROT 6.5 07/31/2021   ALBUMIN 2.7 (L) 09/11/2013   CALCIUM 8.3 (L) 07/31/2021   GFRAA 103 07/07/2020   QFTBGOLDPLUS NEGATIVE 03/24/2019    Speciality Comments: PLQ Eye Exam: 04/28/2021 WNL Dr. Teodoro Spray  f/u 12 months  Procedures:  No procedures performed Allergies: Cefdinir, Ciprofloxacin, and Ciprofibrate   Assessment / Plan:     Visit Diagnoses: Other systemic lupus erythematosus with other organ involvement (HCC) - ANA positive, dsDNA 48, rest of the ENA negative, C3-C4 normal.  History of fatigue, hair loss, arthralgias, oral ulcers, sicca symptoms and photosensitivity: -She continues to have fatigue, hair loss and sicca symptoms.  Over-the-counter products were discussed.  She denies any episodes of Raynaud's phenomena and digital ulcers, malar rash or discoid rash.  She denies any shortness of breath.  Her labs have been stable.  Labs obtained on July 31, 2021 were reviewed.  CBC and CMP were stable calcium was low at 8.3.  Sed rate was 2, complements were normal double-stranded ENA was 18.  Will obtain autoimmune labs today.  Plan: Protein / creatinine ratio, urine, Anti-DNA antibody, double-stranded, C3 and C4, Sedimentation rate.  Increased risk of heart disease with autoimmune disease was advised.  Dietary modifications and exercise were discussed.  A handout was placed in the AVS.  High risk medication use - Plaquenil 200 mg 1 tablet by mouth twice daily Monday through Friday only. PLQ Eye Exam: 04/28/2021 -CBC and CMP were stable on July 31, 2021.  Calcium was low.  Calcium supplement was advised.  I will check labs today.  Plan: CBC with Differential/Platelet, COMPLETE METABOLIC PANEL WITH GFR.  Information about immunization was placed in the AVS.  Annual eye examination was advised.  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  Use of sunscreen and regular use of Plaquenil was discussed.  Sicca complex-she continues to have dry mouth and dry eye symptoms.  She has been using Restasis.  Over-the-counter products were discussed.  Other fatigue-she continues to have some fatigue.  Trapezius muscle spasm-she has bilateral trapezius spasm.  Stretching exercises were  advised.  Other form of scoliosis of lumbar spine-he has intermittent lower back discomfort.  She had no point tenderness.  Vitamin D deficiency-she takes vitamin D.  Vitamin D was 60 on  July 31, 2021.  History of IBS - She was previously evaluated by Dr. Collene Mares and was started on probiotics which improved her symptoms.  History of gastroesophageal reflux (GERD)  Hiatal hernia  History of anxiety  Orders: Orders Placed This Encounter  Procedures   Protein / creatinine ratio, urine   CBC with Differential/Platelet   COMPLETE METABOLIC PANEL WITH GFR   Anti-DNA antibody, double-stranded   C3 and C4   Sedimentation rate   No orders of the defined types were placed in this encounter.    Follow-Up Instructions: Return in about 5 months (around 07/04/2022) for Systemic lupus.   Bo Merino, MD  Note - This record has been created using Editor, commissioning.  Chart creation errors have been sought, but may not always  have been located. Such creation errors do not reflect on  the standard of medical care.

## 2022-02-02 ENCOUNTER — Ambulatory Visit: Payer: 59 | Attending: Rheumatology | Admitting: Rheumatology

## 2022-02-02 ENCOUNTER — Encounter: Payer: Self-pay | Admitting: Rheumatology

## 2022-02-02 VITALS — BP 123/85 | HR 98 | Resp 14 | Ht 65.0 in | Wt 199.0 lb

## 2022-02-02 DIAGNOSIS — M25552 Pain in left hip: Secondary | ICD-10-CM

## 2022-02-02 DIAGNOSIS — E559 Vitamin D deficiency, unspecified: Secondary | ICD-10-CM

## 2022-02-02 DIAGNOSIS — Z8659 Personal history of other mental and behavioral disorders: Secondary | ICD-10-CM

## 2022-02-02 DIAGNOSIS — L930 Discoid lupus erythematosus: Secondary | ICD-10-CM | POA: Diagnosis not present

## 2022-02-02 DIAGNOSIS — R5383 Other fatigue: Secondary | ICD-10-CM | POA: Diagnosis not present

## 2022-02-02 DIAGNOSIS — K449 Diaphragmatic hernia without obstruction or gangrene: Secondary | ICD-10-CM

## 2022-02-02 DIAGNOSIS — Z79899 Other long term (current) drug therapy: Secondary | ICD-10-CM | POA: Diagnosis not present

## 2022-02-02 DIAGNOSIS — M3219 Other organ or system involvement in systemic lupus erythematosus: Secondary | ICD-10-CM | POA: Diagnosis not present

## 2022-02-02 DIAGNOSIS — M62838 Other muscle spasm: Secondary | ICD-10-CM

## 2022-02-02 DIAGNOSIS — T148XXA Other injury of unspecified body region, initial encounter: Secondary | ICD-10-CM

## 2022-02-02 DIAGNOSIS — M4186 Other forms of scoliosis, lumbar region: Secondary | ICD-10-CM

## 2022-02-02 DIAGNOSIS — M3500 Sicca syndrome, unspecified: Secondary | ICD-10-CM

## 2022-02-02 DIAGNOSIS — Z8719 Personal history of other diseases of the digestive system: Secondary | ICD-10-CM

## 2022-02-02 NOTE — Patient Instructions (Addendum)
Vaccines You are taking a medication(s) that can suppress your immune system.  The following immunizations are recommended: Flu annually Covid-19  RSV Td/Tdap (tetanus, diphtheria, pertussis) every 10 years Pneumonia (Prevnar 15 then Pneumovax 23 at least 1 year apart.  Alternatively, can take Prevnar 20 without needing additional dose) Shingrix: 2 doses from 4 weeks to 6 months apart  Please check with your PCP to make sure you are up to date.   Heart Disease Prevention   Your inflammatory disease increases your risk of heart disease which includes heart attack, stroke, atrial fibrillation (irregular heartbeats), high blood pressure, heart failure and atherosclerosis (plaque in the arteries).  It is important to reduce your risk by:   Keep blood pressure, cholesterol, and blood sugar at healthy levels   Smoking Cessation   Maintain a healthy weight  BMI 20-25   Eat a healthy diet  Plenty of fresh fruit, vegetables, and whole grains  Limit saturated fats, foods high in sodium, and added sugars  DASH and Mediterranean diet   Increase physical activity  Recommend moderate physically activity for 150 minutes per week/ 30 minutes a day for five days a week These can be broken up into three separate ten-minute sessions during the day.   Reduce Stress  Meditation, slow breathing exercises, yoga, coloring books  Dental visits twice a year

## 2022-02-03 LAB — CBC WITH DIFFERENTIAL/PLATELET
Absolute Monocytes: 541 cells/uL (ref 200–950)
Basophils Absolute: 53 cells/uL (ref 0–200)
Basophils Relative: 0.8 %
Eosinophils Absolute: 403 cells/uL (ref 15–500)
Eosinophils Relative: 6.1 %
HCT: 40.7 % (ref 35.0–45.0)
Hemoglobin: 13.4 g/dL (ref 11.7–15.5)
Lymphs Abs: 1452 cells/uL (ref 850–3900)
MCH: 28.6 pg (ref 27.0–33.0)
MCHC: 32.9 g/dL (ref 32.0–36.0)
MCV: 86.8 fL (ref 80.0–100.0)
MPV: 10.7 fL (ref 7.5–12.5)
Monocytes Relative: 8.2 %
Neutro Abs: 4151 cells/uL (ref 1500–7800)
Neutrophils Relative %: 62.9 %
Platelets: 261 10*3/uL (ref 140–400)
RBC: 4.69 10*6/uL (ref 3.80–5.10)
RDW: 12.5 % (ref 11.0–15.0)
Total Lymphocyte: 22 %
WBC: 6.6 10*3/uL (ref 3.8–10.8)

## 2022-02-03 LAB — COMPLETE METABOLIC PANEL WITH GFR
AG Ratio: 1.6 (calc) (ref 1.0–2.5)
ALT: 15 U/L (ref 6–29)
AST: 15 U/L (ref 10–30)
Albumin: 3.9 g/dL (ref 3.6–5.1)
Alkaline phosphatase (APISO): 54 U/L (ref 31–125)
BUN: 7 mg/dL (ref 7–25)
CO2: 26 mmol/L (ref 20–32)
Calcium: 9 mg/dL (ref 8.6–10.2)
Chloride: 103 mmol/L (ref 98–110)
Creat: 0.75 mg/dL (ref 0.50–0.99)
Globulin: 2.5 g/dL (calc) (ref 1.9–3.7)
Glucose, Bld: 93 mg/dL (ref 65–99)
Potassium: 4 mmol/L (ref 3.5–5.3)
Sodium: 139 mmol/L (ref 135–146)
Total Bilirubin: 0.3 mg/dL (ref 0.2–1.2)
Total Protein: 6.4 g/dL (ref 6.1–8.1)
eGFR: 102 mL/min/{1.73_m2} (ref >=60)

## 2022-02-03 LAB — PROTEIN / CREATININE RATIO, URINE
Creatinine, Urine: 39 mg/dL (ref 20–275)
Total Protein, Urine: <4 mg/dL — ABNORMAL LOW (ref 5–24)

## 2022-02-03 LAB — ANTI-DNA ANTIBODY, DOUBLE-STRANDED: ds DNA Ab: 11 IU/mL — ABNORMAL HIGH

## 2022-02-03 LAB — C3 AND C4
C3 Complement: 91 mg/dL (ref 83–193)
C4 Complement: 30 mg/dL (ref 15–57)

## 2022-02-03 LAB — SEDIMENTATION RATE: Sed Rate: 2 mm/h (ref 0–20)

## 2022-02-04 ENCOUNTER — Ambulatory Visit
Admission: RE | Admit: 2022-02-04 | Discharge: 2022-02-04 | Disposition: A | Discharge status: Home / Self Care | Payer: 59 | Source: Ambulatory Visit | Attending: Family Medicine | Admitting: Family Medicine

## 2022-02-04 VITALS — BP 120/89 | HR 93 | Temp 99.0°F | Resp 18

## 2022-02-04 DIAGNOSIS — U071 COVID-19: Secondary | ICD-10-CM | POA: Diagnosis not present

## 2022-02-04 DIAGNOSIS — B9789 Other viral agents as the cause of diseases classified elsewhere: Secondary | ICD-10-CM

## 2022-02-04 DIAGNOSIS — R051 Acute cough: Secondary | ICD-10-CM | POA: Diagnosis not present

## 2022-02-04 DIAGNOSIS — J329 Chronic sinusitis, unspecified: Secondary | ICD-10-CM

## 2022-02-04 MED ORDER — PREDNISONE 50 MG PO TABS: ORAL_TABLET | ORAL | 0 refills | Status: DC

## 2022-02-04 MED ORDER — PROMETHAZINE-DM 6.25-15 MG/5ML PO SYRP: 5.0000 mL | ORAL_SOLUTION | Freq: Four times a day (QID) | ORAL | 0 refills | Status: DC | PRN

## 2022-02-04 NOTE — Discharge Instructions (Signed)
Use Flonase twice daily, decongestants, sinus rinses with warm saline, over-the-counter cough and congestion medications.  I have also sent in a few other medications to help with your symptoms.  Your COVID test should be resulted tomorrow.  Return for worsening symptoms.

## 2022-02-04 NOTE — ED Provider Notes (Signed)
[facility]    CSN: [id number] Arrival date & time: 02/04/22  0935      History   Chief Complaint Chief Complaint  Patient presents with   Cough    HPI Emma Franco is a 42 y.o. female.   Presenting today with 3-day history of nasal congestion, facial pain and pressure, hacking cough, headache, ear pressure, mild dizziness with laying down.  Denies chest pain, shortness of breath, fever, chills, body aches, abdominal pain, nausea vomiting or diarrhea.  So far trying over-the-counter cough and congestion medications with minimal relief.    Past Medical History:  Diagnosis Date   Allergy    Anxiety    Arthritis    "in my back"   Chronic sinusitis 05/21/2017   s/p surgery, also complicated by CSF leak, which was repaired    Environmental allergies    Esophageal reflux    ESOPHAGITIS 03/18/2008   Qualifier: Diagnosis of  By: Nils Pyle CMA (AAMA), Mearl Latin     Family history of rheumatoid arthritis 05/21/2017   Mother   Headache(784.0) 07/18/11   "qd for the last year; CSF leak; repaired today"   Hiatal hernia    History of miscarriage 06/26/2017   In first trimester   Irritable bowel syndrome    Lupus ([place])    Migraines    "once in a blue moon"   PONV (postoperative nausea and vomiting)    Postpartum care following cesarean delivery (10/9) 10/16/2013   Previous cesarean delivery, delivered 10/19/2013   Scoliosis    mild   Sinusitis, chronic     Patient Active Problem List   Diagnosis Date Noted   Diarrhea 02/26/2020   Viral URI with cough 11/26/2019   Recurrent major depressive disorder, in full remission ([place]) 11/19/2019   Other forms of systemic lupus erythematosus ([place]) 03/05/2019   Autoimmune disease ([place]) 06/26/2017   Scoliosis 05/21/2017   Previous child with congenital anomaly, currently pregnant, antepartum 06/18/2013   Gastroesophageal reflux disease 03/18/2008   HIATAL HERNIA 03/18/2008   Irritable bowel syndrome 02/06/2008   ARTHRITIS  94/17/4081   Periumbilical pain 44/81/8563    Past Surgical History:  Procedure Laterality Date   CESAREAN SECTION  03/2009   CESAREAN SECTION Bilateral 10/16/2013   Procedure: REPEAT CESAREAN SECTION WITH BILATERAL TUBAL LIGATION ;  Surgeon: Claiborne Billings A. Pamala Hurry, MD;  Location: [facility];  Service: Obstetrics;  Laterality: Bilateral;   EDD: 10/26/13   CHOLECYSTECTOMY  12/1996   NASAL SEPTUM SURGERY  2006   NASAL SINUS SURGERY  03/2010; 07/18/11   REPAIR DURAL / CSF LEAK  07/18/11   SINUS ENDO W/FUSION  07/18/2011   Procedure: ENDOSCOPIC SINUS SURGERY WITH FUSION NAVIGATION;  Surgeon: Ruby Cola, MD;  Location: [facility];  Service: ENT;  Laterality: N/A;   TONSILLECTOMY  1990   TUBAL LIGATION      OB History     Gravida  3   Para  2   Term  2   Preterm      AB  1   Living  2      SAB  1   IAB      Ectopic      Multiple      Live Births  1            Home Medications    Prior to Admission medications   Medication Sig Start Date End Date Taking? Authorizing Provider  predniSONE (DELTASONE) 50 MG tablet Take 1 tab daily with breakfast  for 3 days 02/04/22  Yes Volney American, PA-C  promethazine-dextromethorphan (PROMETHAZINE-DM) 6.25-15 MG/5ML syrup Take 5 mLs by mouth 4 (four) times daily as needed. 02/04/22  Yes Volney American, PA-C  buPROPion [facility]) 200 MG 12 hr tablet Take 1 tablet (200 mg total) by mouth 2 (two) times daily. 03/05/19   Maryruth Hancock, MD  cetirizine (ZYRTEC) 10 MG tablet Take 10 mg by mouth daily.    [provider]  cycloSPORINE (RESTASIS) 0.05 % ophthalmic emulsion 1 drop 2 (two) times daily.    [provider]  escitalopram (LEXAPRO) 20 MG tablet Take 1 tablet (20 mg total) by mouth daily. 03/05/19   Corum, Rex Kras, MD  fluticasone (FLONASE) 50 MCG/ACT nasal spray Place 1 spray into both nostrils as needed. 03/05/19   Maryruth Hancock, MD  hydroxychloroquine (PLAQUENIL) 200 MG tablet TAKE 1 TABLET TWICE DAILY  MONDAY THROUGH FRIDAY. 12/20/21   Ofilia Neas, PA-C  VITAMIN D PO Take by mouth daily.    [provider]    Family History Family History  Problem Relation Age of Onset   Rheum arthritis Mother    Multiple sclerosis Mother    Breast cancer Mother    Stroke Maternal Grandmother    Stroke Paternal Grandmother     Social History Social History   Tobacco Use   Smoking status: Never    Passive exposure: Past   Smokeless tobacco: Never  Vaping Use   Vaping Use: Never used  Substance Use Topics   Alcohol use: Yes    Comment: social   Drug use: No     Allergies   Cefdinir, Ciprofloxacin, and Ciprofibrate   Review of Systems Review of Systems PER HPI  Physical Exam Triage Vital Signs ED Triage Vitals  Enc Vitals Group     BP 02/04/22 1024 120/89     Pulse Rate 02/04/22 1024 93     Resp 02/04/22 1024 18     Temp 02/04/22 1024 99 F (37.2 C)     Temp Source 02/04/22 1024 Oral     SpO2 02/04/22 1024 98 %     Weight --      Height --      Head Circumference --      Peak Flow --      Pain Score 02/04/22 1025 6     Pain Loc --      Pain Edu? --      Excl. in [place]? --    No data found.  Updated Vital Signs BP 120/89 (BP Location: Right Arm)   Pulse 93   Temp 99 F (37.2 C) (Oral)   Resp 18   LMP 01/24/2022 (Exact Date)   SpO2 98%   Visual Acuity Right Eye Distance:   Left Eye Distance:   Bilateral Distance:    Right Eye Near:   Left Eye Near:    Bilateral Near:     Physical Exam Vitals and nursing note reviewed.  Constitutional:      Appearance: Normal appearance.  HENT:     Head: Atraumatic.     Right Ear: Tympanic membrane and external ear normal.     Left Ear: Tympanic membrane and external ear normal.     Nose: Rhinorrhea present.     Mouth/Throat:     Mouth: Mucous membranes are moist.     Pharynx: Posterior oropharyngeal erythema present.  Eyes:     Extraocular Movements: Extraocular movements intact.  Conjunctiva/sclera:  Conjunctivae normal.  Cardiovascular:     Rate and Rhythm: Normal rate and regular rhythm.     Heart sounds: Normal heart sounds.  Pulmonary:     Effort: Pulmonary effort is normal.     Breath sounds: Normal breath sounds. No wheezing.  Musculoskeletal:        General: Normal range of motion.     Cervical back: Normal range of motion and neck supple.  Skin:    General: Skin is warm and dry.  Neurological:     Mental Status: She is alert and oriented to person, place, and time.  Psychiatric:        Mood and Affect: Mood normal.        Thought Content: Thought content normal.    UC Treatments / Results  Labs (all labs ordered are listed, but only abnormal results are displayed) Labs Reviewed  SARS CORONAVIRUS 2 (TAT 6-24 HRS)    EKG   Radiology No results found.  Procedures Procedures (including critical care time)  Medications Ordered in UC Medications - No data to display  Initial Impression / Assessment and Plan / UC Course  I have reviewed the triage vital signs and the nursing notes.  Pertinent labs & imaging results that were available during my care of the patient were reviewed by me and considered in my medical decision making (see chart for details).     Consistent with viral upper respiratory infection, COVID testing pending, treat with short course of prednisone for middle ear effusion likely causing her dizziness/vertigo symptoms and Phenergan DM, Flonase, decongestants.  Return for worsening symptoms.  Final Clinical Impressions(s) / UC Diagnoses   Final diagnoses:  Viral sinusitis  Acute cough     Discharge Instructions      Use Flonase twice daily, decongestants, sinus rinses with warm saline, over-the-counter cough and congestion medications.  I have also sent in a few other medications to help with your symptoms.  Your COVID test should be resulted tomorrow.  Return for worsening symptoms.    ED Prescriptions     Medication Sig Dispense  Auth. Provider   promethazine-dextromethorphan (PROMETHAZINE-DM) 6.25-15 MG/5ML syrup Take 5 mLs by mouth 4 (four) times daily as needed. 100 mL Volney American, PA-C   predniSONE (DELTASONE) 50 MG tablet Take 1 tab daily with breakfast for 3 days 3 tablet Volney American, [place]      PDMP not reviewed this encounter.   Volney American, [place] 02/04/22 1044

## 2022-02-04 NOTE — Progress Notes (Signed)
CBC and CMP are normal.  Sed rate is normal.  Anti-DNA antibody is positive and lower titer.  Urine protein is negative.  Complements are normal.  Labs do not indicate an autoimmune disease flare.

## 2022-02-04 NOTE — ED Triage Notes (Signed)
Pt reports a sinus infection, cough, headache, and dizziness x 3 days

## 2022-02-05 LAB — SARS CORONAVIRUS 2 (TAT 6-24 HRS): SARS Coronavirus 2: POSITIVE — AB

## 2022-03-16 ENCOUNTER — Other Ambulatory Visit: Payer: Self-pay | Admitting: Physician Assistant

## 2022-03-16 DIAGNOSIS — M359 Systemic involvement of connective tissue, unspecified: Secondary | ICD-10-CM

## 2022-03-16 NOTE — Telephone Encounter (Signed)
Next Visit: 07/04/2022  Last Visit: 02/02/2022  Labs: 02/02/2022  CBC and CMP are normal.  Sed rate is normal.  Anti-DNA antibody is positive and lower titer.  Urine protein is negative.  Complements are normal.  Labs do not indicate an autoimmune disease flare.   Eye exam: 04/28/2021   Current Dose per office note 02/02/2022: Plaquenil 200 mg 1 tablet by mouth twice daily Monday through Friday only.   [id number] systemic lupus erythematosus with other organ involvement   Last Fill: 12/20/2021  Okay to refill Plaquenil?

## 2022-04-09 ENCOUNTER — Ambulatory Visit: Payer: 59

## 2022-04-09 ENCOUNTER — Ambulatory Visit
Admission: RE | Admit: 2022-04-09 | Discharge: 2022-04-09 | Disposition: A | Discharge status: Home / Self Care | Payer: 59 | Source: Ambulatory Visit | Attending: Family Medicine | Admitting: Family Medicine

## 2022-04-09 VITALS — BP 123/85 | HR 95 | Temp 99.0°F | Resp 18

## 2022-04-09 DIAGNOSIS — J208 Acute bronchitis due to other specified organisms: Secondary | ICD-10-CM | POA: Diagnosis not present

## 2022-04-09 DIAGNOSIS — R42 Dizziness and giddiness: Secondary | ICD-10-CM | POA: Diagnosis not present

## 2022-04-09 LAB — POCT RAPID STREP A (OFFICE): Rapid Strep A Screen: NEGATIVE

## 2022-04-09 MED ORDER — PREDNISONE 20 MG PO TABS: 40.0000 mg | ORAL_TABLET | Freq: Every day | ORAL | 0 refills | Status: DC

## 2022-04-09 MED ORDER — MECLIZINE HCL 25 MG PO TABS: 25.0000 mg | ORAL_TABLET | Freq: Three times a day (TID) | ORAL | 0 refills | Status: DC | PRN

## 2022-04-09 MED ORDER — PROMETHAZINE-DM 6.25-15 MG/5ML PO SYRP: 5.0000 mL | ORAL_SOLUTION | Freq: Four times a day (QID) | ORAL | 0 refills | Status: DC | PRN

## 2022-04-09 NOTE — ED Provider Notes (Signed)
[facility]    CSN: [id number] Arrival date & time: 04/09/22  1241      History   Chief Complaint Chief Complaint  Patient presents with   Cough    Cough, congestion, sore throat, dizzy - Entered by patient   Sore Throat    HPI Josilin Sether is a 42 y.o. female.   Patient presenting today with 4 day history of initially scratchy throat and nasal congestion now with productive cough and vertigo.  Denies fever, chills, body aches, chest pain, shortness of breath, abdominal pain, nausea vomiting or diarrhea.  So far trying Mucinex, and Zyrtec, Tylenol with minimal relief.  History of seasonal allergies and states anytime she has excessive drainage she gets vertigo type symptoms.  No known sick contacts recently.   Past Medical History:  Diagnosis Date   Allergy    Anxiety    Arthritis    "in my back"   Chronic sinusitis 05/21/2017   s/p surgery, also complicated by CSF leak, which was repaired    Environmental allergies    Esophageal reflux    ESOPHAGITIS 03/18/2008   Qualifier: Diagnosis of  By: Nils Pyle CMA (AAMA), Mearl Latin     Family history of rheumatoid arthritis 05/21/2017   Mother   Headache(784.0) 07/18/11   "qd for the last year; CSF leak; repaired today"   Hiatal hernia    History of miscarriage 06/26/2017   In first trimester   Irritable bowel syndrome    Lupus    Migraines    "once in a blue moon"   PONV (postoperative nausea and vomiting)    Postpartum care following cesarean delivery (10/9) 10/16/2013   Previous cesarean delivery, delivered 10/19/2013   Scoliosis    mild   Sinusitis, chronic    Patient Active Problem List   Diagnosis Date Noted   Diarrhea 02/26/2020   Viral URI with cough 11/26/2019   Recurrent major depressive disorder, in full remission 11/19/2019   Other forms of systemic lupus erythematosus 03/05/2019   Autoimmune disease 06/26/2017   Scoliosis 05/21/2017   Previous child with congenital anomaly, currently pregnant,  antepartum 06/18/2013   Gastroesophageal reflux disease 03/18/2008   HIATAL HERNIA 03/18/2008   Irritable bowel syndrome 02/06/2008   ARTHRITIS 123XX123   Periumbilical pain 123XX123   Past Surgical History:  Procedure Laterality Date   CESAREAN SECTION  03/2009   CESAREAN SECTION Bilateral 10/16/2013   Procedure: REPEAT CESAREAN SECTION WITH BILATERAL TUBAL LIGATION ;  Surgeon: Claiborne Billings A. Pamala Hurry, MD;  Location: [facility];  Service: Obstetrics;  Laterality: Bilateral;   EDD: 10/26/13   CHOLECYSTECTOMY  12/1996   NASAL SEPTUM SURGERY  2006   NASAL SINUS SURGERY  03/2010; 07/18/11   REPAIR DURAL / CSF LEAK  07/18/11   SINUS ENDO W/FUSION  07/18/2011   Procedure: ENDOSCOPIC SINUS SURGERY WITH FUSION NAVIGATION;  Surgeon: Ruby Cola, MD;  Location: [facility];  Service: ENT;  Laterality: N/A;   TONSILLECTOMY  1990   TUBAL LIGATION     OB History     Gravida  3   Para  2   Term  2   Preterm      AB  1   Living  2      SAB  1   IAB      Ectopic      Multiple      Live Births  1          Home Medications    Prior  to Admission medications   Medication Sig Start Date End Date Taking? Authorizing Provider  buPROPion (WELLBUTRIN SR) 200 MG 12 hr tablet Take 1 tablet (200 mg total) by mouth 2 (two) times daily. 03/05/19  Yes Corum, Rex Kras, MD  cetirizine (ZYRTEC) 10 MG tablet Take 10 mg by mouth daily.   Yes [provider]  cycloSPORINE (RESTASIS) 0.05 % ophthalmic emulsion 1 drop 2 (two) times daily.   Yes [provider]  escitalopram (LEXAPRO) 20 MG tablet Take 1 tablet (20 mg total) by mouth daily. 03/05/19  Yes Corum, Rex Kras, MD  fluticasone (FLONASE) 50 MCG/ACT nasal spray Place 1 spray into both nostrils as needed. 03/05/19  Yes Corum, Rex Kras, MD  hydroxychloroquine (PLAQUENIL) 200 MG tablet TAKE 1 TABLET TWICE DAILY MONDAY THROUGH FRIDAY. 03/16/22  Yes Deveshwar, Abel Presto, MD  meclizine (ANTIVERT) 25 MG tablet Take 1 tablet (25 mg total) by mouth 3  (three) times daily as needed for dizziness. 04/09/22  Yes Volney American, PA-C  predniSONE (DELTASONE) 20 MG tablet Take 2 tablets (40 mg total) by mouth daily with breakfast. 04/09/22  Yes Volney American, PA-C  promethazine-dextromethorphan (PROMETHAZINE-DM) 6.25-15 MG/5ML syrup Take 5 mLs by mouth 4 (four) times daily as needed. 04/09/22  Yes Volney American, PA-C  VITAMIN D PO Take by mouth daily.   Yes [provider]  predniSONE (DELTASONE) 50 MG tablet Take 1 tab daily with breakfast for 3 days 02/04/22   Volney American, PA-C  promethazine-dextromethorphan (PROMETHAZINE-DM) 6.25-15 MG/5ML syrup Take 5 mLs by mouth 4 (four) times daily as needed. 02/04/22   Volney American, PA-C    Family History Family History  Problem Relation Age of Onset   Rheum arthritis Mother    Multiple sclerosis Mother    Breast cancer Mother    Stroke Maternal Grandmother    Stroke Paternal Grandmother     Social History Social History   Tobacco Use   Smoking status: Never    Passive exposure: Past   Smokeless tobacco: Never  Vaping Use   Vaping Use: Never used  Substance Use Topics   Alcohol use: Yes    Comment: social   Drug use: No     Allergies   Cefdinir, Ciprofloxacin, and Ciprofibrate  Review of Systems Review of Systems PER HPI  Physical Exam Triage Vital Signs ED Triage Vitals [04/09/22 1324]  Enc Vitals Group     BP 123/85     Pulse Rate 95     Resp 18     Temp 99 F (37.2 C)     Temp Source Oral     SpO2 98 %     Weight      Height      Head Circumference      Peak Flow      Pain Score 5     Pain Loc      Pain Edu?      Excl. in [place]?    No data found.  Updated Vital Signs BP 123/85 (BP Location: Right Arm)   Pulse 95   Temp 99 F (37.2 C) (Oral)   Resp 18   LMP 03/11/2022 (Approximate)   SpO2 98%   Visual Acuity Right Eye Distance:   Left Eye Distance:   Bilateral Distance:    Right Eye Near:   Left Eye Near:     Bilateral Near:     Physical Exam Vitals and nursing note reviewed.  Constitutional:  Appearance: Normal appearance. She is not ill-appearing.  HENT:     Head: Atraumatic.     Right Ear: Tympanic membrane and external ear normal.     Left Ear: Tympanic membrane and external ear normal.     Nose: Rhinorrhea present.     Mouth/Throat:     Mouth: Mucous membranes are moist.     Pharynx: Posterior oropharyngeal erythema present.  Eyes:     Extraocular Movements: Extraocular movements intact.     Conjunctiva/sclera: Conjunctivae normal.  Cardiovascular:     Rate and Rhythm: Normal rate and regular rhythm.     Heart sounds: Normal heart sounds.  Pulmonary:     Effort: Pulmonary effort is normal.     Breath sounds: Normal breath sounds. No wheezing or rales.  Musculoskeletal:        General: Normal range of motion.     Cervical back: Normal range of motion and neck supple.  Skin:    General: Skin is warm and dry.  Neurological:     Mental Status: She is alert and oriented to person, place, and time. Mental status is at baseline.     Cranial Nerves: No cranial nerve deficit.     Motor: No weakness.     Gait: Gait normal.  Psychiatric:        Mood and Affect: Mood normal.        Thought Content: Thought content normal.        Judgment: Judgment normal.      UC Treatments / Results  Labs (all labs ordered are listed, but only abnormal results are displayed) Labs Reviewed  POCT RAPID STREP A (OFFICE)    EKG   Radiology No results found.  Procedures Procedures (including critical care time)  Medications Ordered in UC Medications - No data to display  Initial Impression / Assessment and Plan / UC Course  I have reviewed the triage vital signs and the nursing notes.  Pertinent labs & imaging results that were available during my care of the patient were reviewed by me and considered in my medical decision making (see chart for details).     Will treat for  viral upper respiratory infection with prednisone, Phenergan DM and vertigo with meclizine as needed.  Continue allergy regimen with Zyrtec and Flonase daily, discussed supportive over-the-counter medications and home care additionally.  Return precautions reviewed.  Final Clinical Impressions(s) / UC Diagnoses   Final diagnoses:  Viral bronchitis  Vertigo   Discharge Instructions   None    ED Prescriptions     Medication Sig Dispense Auth. Provider   predniSONE (DELTASONE) 20 MG tablet Take 2 tablets (40 mg total) by mouth daily with breakfast. 10 tablet Volney American, PA-C   promethazine-dextromethorphan (PROMETHAZINE-DM) 6.25-15 MG/5ML syrup Take 5 mLs by mouth 4 (four) times daily as needed. 100 mL Volney American, PA-C   meclizine (ANTIVERT) 25 MG tablet Take 1 tablet (25 mg total) by mouth 3 (three) times daily as needed for dizziness. 30 tablet Volney American, [place]      PDMP not reviewed this encounter.   Volney American, [place] 04/09/22 1537

## 2022-04-09 NOTE — ED Triage Notes (Signed)
Sore throat, cough with yellow and green mucus , congestion, dizziness pt states she normally gets vertigo when sick that started Thursday. Taking mucinex, tylenol, zyrtec with no relief of symptoms.

## 2022-04-19 ENCOUNTER — Other Ambulatory Visit: Payer: Self-pay | Admitting: Physician Assistant

## 2022-04-19 DIAGNOSIS — M359 Systemic involvement of connective tissue, unspecified: Secondary | ICD-10-CM

## 2022-06-14 LAB — HM MAMMOGRAPHY

## 2022-06-20 NOTE — Progress Notes (Unsigned)
Office Visit Note  Patient: Emma Franco             Date of Birth: 05-05-1980           MRN: [id number]             PCP: Pcp, No Referring: Anabel Halon, MD Visit Date: 07/04/2022 Occupation: @GUAROCC @  Subjective:  Fatigue and intermittent myalgias   History of Present Illness: Emma Franco is a 42 y.o. female with history of systemic lupus erythematosus.  Patient remains on  Plaquenil 200 mg 1 tablet by mouth twice daily Monday through Friday only.  She continues to tolerate Plaquenil without any side effects and has not missed any doses recently.  She denies any signs or symptoms of a systemic lupus flare.  Patient reports overall her symptoms have been stable.  She continues to have chronic fatigue, hair loss, and sicca symptoms.  She has been using Restasis eyedrops for dry eyes.  She remains on vitamin D 2000 units daily.  She is not currently taking anything for hair loss.  She denies any recent rashes.  She has not had any sores in her mouth or nose.  She denies any increased joint pain or joint swelling but has occasional myalgias.  She has been getting massages on a monthly basis which she finds to be helpful.  She denies any new medical conditions.    Activities of Daily Living:  Patient reports morning stiffness for 15-20 minutes.   Patient Denies nocturnal pain.  Difficulty dressing/grooming: Denies Difficulty climbing stairs: Reports Difficulty getting out of chair: Denies Difficulty using hands for taps, buttons, cutlery, and/or writing: Denies  Review of Systems  Constitutional:  Positive for fatigue.  HENT:  Positive for mouth dryness. Negative for mouth sores and nose dryness.   Eyes:  Positive for dryness.  Respiratory:  Positive for shortness of breath.        With exertion   Cardiovascular:  Negative for chest pain and palpitations.  Gastrointestinal:  Positive for constipation and diarrhea. Negative for blood in stool.  Endocrine: Negative  for increased urination.  Genitourinary:  Negative for involuntary urination.  Musculoskeletal:  Positive for myalgias, muscle weakness, morning stiffness, muscle tenderness and myalgias. Negative for joint pain, gait problem, joint pain and joint swelling.  Skin:  Positive for hair loss and sensitivity to sunlight. Negative for color change and rash.  Allergic/Immunologic: Negative for susceptible to infections.  Neurological:  Positive for dizziness and headaches.  Hematological:  Negative for swollen glands.  Psychiatric/Behavioral:  Positive for depressed mood. Negative for sleep disturbance. The patient is nervous/anxious.     PMFS History:  Patient Active Problem List   Diagnosis Date Noted   Diarrhea 02/26/2020   Viral URI with cough 11/26/2019   Recurrent major depressive disorder, in full remission (HCC) 11/19/2019   Other forms of systemic lupus erythematosus (HCC) 03/05/2019   Autoimmune disease (HCC) 06/26/2017   Scoliosis 05/21/2017   Previous child with congenital anomaly, currently pregnant, antepartum 06/18/2013   Gastroesophageal reflux disease 03/18/2008   HIATAL HERNIA 03/18/2008   Irritable bowel syndrome 02/06/2008   ARTHRITIS 02/06/2008   Periumbilical pain 02/06/2008    Past Medical History:  Diagnosis Date   Allergy    Anxiety    Arthritis    "in my back"   Chronic sinusitis 05/21/2017   s/p surgery, also complicated by CSF leak, which was repaired    Environmental allergies    Esophageal reflux  ESOPHAGITIS 03/18/2008   Qualifier: Diagnosis of  By: Koleen Distance CMA (AAMA), Hulan Saas     Family history of rheumatoid arthritis 05/21/2017   Mother   Headache(784.0) 07/18/11   "qd for the last year; CSF leak; repaired today"   Hiatal hernia    History of miscarriage 06/26/2017   In first trimester   Irritable bowel syndrome    Lupus (HCC)    Migraines    "once in a blue moon"   PONV (postoperative nausea and vomiting)    Postpartum care following cesarean  delivery (10/9) 10/16/2013   Previous cesarean delivery, delivered 10/19/2013   Scoliosis    mild   Sinusitis, chronic     Family History  Problem Relation Age of Onset   Rheum arthritis Mother    Multiple sclerosis Mother    Breast cancer Mother    Stroke Maternal Grandmother    Stroke Paternal Grandmother    Past Surgical History:  Procedure Laterality Date   CESAREAN SECTION  03/2009   CESAREAN SECTION Bilateral 10/16/2013   Procedure: REPEAT CESAREAN SECTION WITH BILATERAL TUBAL LIGATION ;  Surgeon: Tresa Endo A. Ernestina Penna, MD;  Location: [facility];  Service: Obstetrics;  Laterality: Bilateral;   EDD: 10/26/13   CHOLECYSTECTOMY  12/1996   NASAL SEPTUM SURGERY  2006   NASAL SINUS SURGERY  03/2010; 07/18/11   REPAIR DURAL / CSF LEAK  07/18/11   SINUS ENDO W/FUSION  07/18/2011   Procedure: ENDOSCOPIC SINUS SURGERY WITH FUSION NAVIGATION;  Surgeon: Melvenia Beam, MD;  Location: [facility];  Service: ENT;  Laterality: N/A;   TONSILLECTOMY  1990   TUBAL LIGATION     Social History   Social History Narrative   Lives with husband Casimiro Needle- married 14 years    2 girls: Rylee 10 and Stella 6       Dog: Gpys- Double Doodle      Enjoys: hanging out with friends, building a house right now      Diet: eats all food groups    Caffeine: 1-2 cups    Water: 5 bottles       Wears seat belt   Does not use phone while driving   Smoke and carbon Psychologist, prison and probation services- none    Immunization History  Administered Date(s) Administered   Influenza,inj,Quad PF,6+ Mos 10/18/2019   Influenza-Unspecified 09/30/2017, 09/17/2018   Meningococcal polysaccharide vaccine (MPSV4) 07/19/2011   PFIZER(Purple Top)SARS-COV-2 Vaccination 09/03/2019, 09/24/2019   Pneumococcal Conjugate-13 11/11/2017   Pneumococcal Polysaccharide-23 07/19/2011     Objective: Vital Signs: BP 120/85 (BP Location: Left Arm, Patient Position: Sitting, Cuff Size: Normal)   Pulse 89   Resp 16   Ht 5\' 5"  (1.651 m)   Wt  200 lb 3.2 oz (90.8 kg)   BMI 33.32 kg/m    Physical Exam Vitals and nursing note reviewed.  Constitutional:      Appearance: She is well-developed.  HENT:     Head: Normocephalic and atraumatic.  Eyes:     Conjunctiva/sclera: Conjunctivae normal.  Cardiovascular:     Rate and Rhythm: Normal rate and regular rhythm.     Heart sounds: Normal heart sounds.  Pulmonary:     Effort: Pulmonary effort is normal.     Breath sounds: Normal breath sounds.  Abdominal:     General: Bowel sounds are normal.     Palpations: Abdomen is soft.  Musculoskeletal:     Cervical back: Normal range of motion.  Skin:    General:  Skin is warm and dry.     Capillary Refill: Capillary refill takes less than 2 seconds.     Comments: No malar rash  Neurological:     Mental Status: She is alert and oriented to person, place, and time.  Psychiatric:        Behavior: Behavior normal.      Musculoskeletal Exam: C-spine has good range of motion.  Thoracolumbar scoliosis. Shoulder joints, elbow joints, wrist joints, MCPs, PIPs, DIPs have good range of motion with no synovitis.  Complete fist formation bilaterally.  Hip joints have good range of motion with no groin pain.  Knee joints have good range of motion with no warmth or effusion.  Ankle joints have good range of motion with no joint tenderness.  Some tenderness over the right second MTP joint.  No evidence of Achilles tendinitis or plantar fasciitis.  No tenderness over the trochanteric bursa bilaterally.  CDAI Exam: CDAI Score: -- Patient Global: --; Provider Global: -- Swollen: --; Tender: -- Joint Exam 07/04/2022   No joint exam has been documented for this visit   There is currently no information documented on the homunculus. Go to the Rheumatology activity and complete the homunculus joint exam.  Investigation: No additional findings.  Imaging: No results found.  Recent Labs: Lab Results  Component Value Date   WBC 6.6 02/02/2022    HGB 13.4 02/02/2022   PLT 261 02/02/2022   NA 139 02/02/2022   K 4.0 02/02/2022   CL 103 02/02/2022   CO2 26 02/02/2022   GLUCOSE 93 02/02/2022   BUN 7 02/02/2022   CREATININE 0.75 02/02/2022   BILITOT 0.3 02/02/2022   ALKPHOS 131 (H) 09/11/2013   AST 15 02/02/2022   ALT 15 02/02/2022   PROT 6.4 02/02/2022   ALBUMIN 2.7 (L) 09/11/2013   CALCIUM 9.0 02/02/2022   GFRAA 103 07/07/2020   QFTBGOLDPLUS NEGATIVE 03/24/2019    Speciality Comments: PLQ Eye Exam: 04/28/2021 WNL Dr. Jimmye Norman f/u 12 months  Procedures:  No procedures performed Allergies: Cefdinir, Ciprofloxacin, and Ciprofibrate     Assessment / Plan:     Visit Diagnoses: Other systemic lupus erythematosus with other organ involvement (HCC) - - ANA positive, dsDNA 48, rest of the ENA negative, C3-C4 normal.  History of fatigue, hair loss, arthralgias, oral ulcers, sicca symptoms and photosensitivity: She has not had any signs or symptoms of a systemic lupus flare.  She continues to have persistent fatigue, intermittent myalgias, hair loss, and sicca symptoms.  Overall her symptoms have been stable.  She remains on Plaquenil 200 mg 1 tablet by mouth twice daily Monday through Friday.  She continues to tolerate Plaquenil without any side effects.  She has not had any oral or nasal ulcerations.  She has no synovitis on examination today.  No Malar rash noted. No discoid lesions. Discussed the importance of avoiding direct sun exposure and to wear sunscreen on a daily basis. She is not currently experiencing any shortness of breath or pleuritic chest pain.  Her lungs were clear to auscultation today. Lab work from 02/02/22 was reviewed today in the office: dsDNA 11 (improved), complements WNL, ESR WNL, no proteinuria, CBC WNL, and CMP WNL. The following lab work will be obtained today.  She will remain on plaquenil as prescribed.  She was advised to notify us if she develops signs or symptoms of a flare.  She will follow-up in the  office in 5 months or sooner if needed.  - Plan: hydroxychloroquine (PLAQUENIL)  200 MG tablet, CBC with Differential/Platelet, COMPLETE METABOLIC PANEL WITH GFR, VITAMIN D 25 Hydroxy (Vit-D Deficiency, Fractures), Anti-DNA antibody, double-stranded, C3 and C4, Sedimentation rate, Protein / creatinine ratio, urine  High risk medication use - Plaquenil 200 mg 1 tablet by mouth twice daily Monday through Friday only.  CBC and CMP WNL on 02/02/22. CBC and CMP updated today.  Patient had an updated Plaquenil eye examination in May 2024.  Will call to obtain these records.  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  Discussed the importance of avoiding direct sun exposure.   Other fatigue: Chronic, stable.  The following lab work obtained today.  Sicca complex [facility]): She continues to have chronic sicca symptoms.  She has been using Restasis eyedrops for dry eyes.  Trapezius muscle spasm: She has been having monthly massages.  Other form of scoliosis of lumbar spine: Patient has been going for massage on a monthly basis which she finds to be helpful.  Vitamin D deficiency -She is taking vitamin D 2000 units daily. Vitamin D will be checked today. Plan: VITAMIN D 25 Hydroxy (Vit-D Deficiency, Fractures)  Other medical conditions are listed as follows:   History of IBS - Evaluated by Dr. Loreta Ave  History of gastroesophageal reflux (GERD)  Hiatal hernia  History of anxiety      Orders: Orders Placed This Encounter  Procedures   CBC with Differential/Platelet   COMPLETE METABOLIC PANEL WITH GFR   VITAMIN D 25 Hydroxy (Vit-D Deficiency, Fractures)   Anti-DNA antibody, double-stranded   C3 and C4   Sedimentation rate   Protein / creatinine ratio, urine   Meds ordered this encounter  Medications   hydroxychloroquine (PLAQUENIL) 200 MG tablet    Sig: Take 1 tablet by mouth twice daily Monday through Friday.    Dispense:  120  tablet    Refill:  0      Follow-Up Instructions: Return in about 5 months (around 12/04/2022) for Systemic lupus erythematosus.   Gearldine Bienenstock, PA-C  Note - This record has been created using Dragon software.  Chart creation errors have been sought, but may not always  have been located. Such creation errors do not reflect on  the standard of medical care.

## 2022-06-26 LAB — HM PAP SMEAR: HPV, high-risk: NEGATIVE

## 2022-07-04 ENCOUNTER — Ambulatory Visit: Payer: 59 | Attending: Physician Assistant | Admitting: Physician Assistant

## 2022-07-04 ENCOUNTER — Encounter: Payer: Self-pay | Admitting: Physician Assistant

## 2022-07-04 VITALS — BP 120/85 | HR 89 | Resp 16 | Ht 65.0 in | Wt 200.2 lb

## 2022-07-04 DIAGNOSIS — M4186 Other forms of scoliosis, lumbar region: Secondary | ICD-10-CM | POA: Diagnosis not present

## 2022-07-04 DIAGNOSIS — K449 Diaphragmatic hernia without obstruction or gangrene: Secondary | ICD-10-CM

## 2022-07-04 DIAGNOSIS — M3500 Sicca syndrome, unspecified: Secondary | ICD-10-CM

## 2022-07-04 DIAGNOSIS — M3219 Other organ or system involvement in systemic lupus erythematosus: Secondary | ICD-10-CM | POA: Diagnosis not present

## 2022-07-04 DIAGNOSIS — M62838 Other muscle spasm: Secondary | ICD-10-CM

## 2022-07-04 DIAGNOSIS — R5383 Other fatigue: Secondary | ICD-10-CM | POA: Diagnosis not present

## 2022-07-04 DIAGNOSIS — E559 Vitamin D deficiency, unspecified: Secondary | ICD-10-CM

## 2022-07-04 DIAGNOSIS — L930 Discoid lupus erythematosus: Secondary | ICD-10-CM

## 2022-07-04 DIAGNOSIS — Z8719 Personal history of other diseases of the digestive system: Secondary | ICD-10-CM

## 2022-07-04 DIAGNOSIS — Z8659 Personal history of other mental and behavioral disorders: Secondary | ICD-10-CM

## 2022-07-04 DIAGNOSIS — Z79899 Other long term (current) drug therapy: Secondary | ICD-10-CM

## 2022-07-04 DIAGNOSIS — M359 Systemic involvement of connective tissue, unspecified: Secondary | ICD-10-CM

## 2022-07-04 MED ORDER — HYDROXYCHLOROQUINE SULFATE 200 MG PO TABS: ORAL_TABLET | ORAL | 0 refills | Status: DC

## 2022-07-05 LAB — CBC WITH DIFFERENTIAL/PLATELET
Absolute Monocytes: 567 cells/uL (ref 200–950)
Basophils Absolute: 48 cells/uL (ref 0–200)
Basophils Relative: 0.9 %
Eosinophils Absolute: 519 cells/uL — ABNORMAL HIGH (ref 15–500)
Eosinophils Relative: 9.8 %
HCT: 41 % (ref 35.0–45.0)
Hemoglobin: 13.5 g/dL (ref 11.7–15.5)
Lymphs Abs: 1728 cells/uL (ref 850–3900)
MCH: 28.5 pg (ref 27.0–33.0)
MCHC: 32.9 g/dL (ref 32.0–36.0)
MCV: 86.5 fL (ref 80.0–100.0)
MPV: 10.2 fL (ref 7.5–12.5)
Monocytes Relative: 10.7 %
Neutro Abs: 2438 cells/uL (ref 1500–7800)
Neutrophils Relative %: 46 %
Platelets: 308 10*3/uL (ref 140–400)
RBC: 4.74 10*6/uL (ref 3.80–5.10)
RDW: 12.8 % (ref 11.0–15.0)
Total Lymphocyte: 32.6 %
WBC: 5.3 10*3/uL (ref 3.8–10.8)

## 2022-07-05 LAB — COMPLETE METABOLIC PANEL WITH GFR
AG Ratio: 1.6 (calc) (ref 1.0–2.5)
ALT: 14 U/L (ref 6–29)
AST: 15 U/L (ref 10–30)
Albumin: 3.9 g/dL (ref 3.6–5.1)
Alkaline phosphatase (APISO): 60 U/L (ref 31–125)
BUN/Creatinine Ratio: 6 (calc) (ref 6–22)
BUN: 5 mg/dL — ABNORMAL LOW (ref 7–25)
CO2: 28 mmol/L (ref 20–32)
Calcium: 9.3 mg/dL (ref 8.6–10.2)
Chloride: 104 mmol/L (ref 98–110)
Creat: 0.83 mg/dL (ref 0.50–0.99)
Globulin: 2.5 g/dL (calc) (ref 1.9–3.7)
Glucose, Bld: 70 mg/dL (ref 65–99)
Potassium: 3.8 mmol/L (ref 3.5–5.3)
Sodium: 141 mmol/L (ref 135–146)
Total Bilirubin: 0.3 mg/dL (ref 0.2–1.2)
Total Protein: 6.4 g/dL (ref 6.1–8.1)
eGFR: 90 mL/min/{1.73_m2} (ref >=60)

## 2022-07-05 LAB — ANTI-DNA ANTIBODY, DOUBLE-STRANDED: ds DNA Ab: 10 IU/mL — ABNORMAL HIGH

## 2022-07-05 LAB — C3 AND C4
C3 Complement: 103 mg/dL (ref 83–193)
C4 Complement: 37 mg/dL (ref 15–57)

## 2022-07-05 LAB — PROTEIN / CREATININE RATIO, URINE
Creatinine, Urine: 104 mg/dL (ref 20–275)
Protein/Creat Ratio: 58 mg/g creat (ref 24–184)
Protein/Creatinine Ratio: 0.058 mg/mg creat (ref 0.024–0.184)
Total Protein, Urine: 6 mg/dL (ref 5–24)

## 2022-07-05 LAB — VITAMIN D 25 HYDROXY (VIT D DEFICIENCY, FRACTURES): Vit D, 25-Hydroxy: 49 ng/mL (ref 30–100)

## 2022-07-05 LAB — SEDIMENTATION RATE: Sed Rate: 2 mm/h (ref 0–20)

## 2022-07-05 NOTE — Progress Notes (Signed)
Absolute eosinophils are borderline elevated. Rest of CBC WNL.  CMP WNL ESR WNL Protein creatinine ratio WNL Vitamin D WNL Complements WNL

## 2022-07-06 NOTE — Progress Notes (Signed)
dsDNA is borderline positive-10-continues to trend down.  No change in therapy recommended

## 2022-09-11 ENCOUNTER — Encounter: Payer: Self-pay | Admitting: Physician Assistant

## 2022-09-11 ENCOUNTER — Ambulatory Visit: Payer: 59 | Admitting: Physician Assistant

## 2022-09-11 VITALS — BP 120/80 | HR 85 | Temp 97.5°F | Ht 65.0 in | Wt 204.4 lb

## 2022-09-11 DIAGNOSIS — Z136 Encounter for screening for cardiovascular disorders: Secondary | ICD-10-CM

## 2022-09-11 DIAGNOSIS — F419 Anxiety disorder, unspecified: Secondary | ICD-10-CM | POA: Diagnosis not present

## 2022-09-11 DIAGNOSIS — Z13220 Encounter for screening for lipoid disorders: Secondary | ICD-10-CM | POA: Diagnosis not present

## 2022-09-11 DIAGNOSIS — E669 Obesity, unspecified: Secondary | ICD-10-CM | POA: Diagnosis not present

## 2022-09-11 DIAGNOSIS — M328 Other forms of systemic lupus erythematosus: Secondary | ICD-10-CM

## 2022-09-11 DIAGNOSIS — Z23 Encounter for immunization: Secondary | ICD-10-CM

## 2022-09-11 LAB — LIPID PANEL
Cholesterol: 161 mg/dL (ref 0–200)
HDL: 59.1 mg/dL (ref >39.00)
LDL Cholesterol: 88 mg/dL (ref 0–99)
NonHDL: 101.63
Total CHOL/HDL Ratio: 3
Triglycerides: 70 mg/dL (ref 0.0–149.0)
VLDL: 14 mg/dL (ref 0.0–40.0)

## 2022-09-11 MED ORDER — ZEPBOUND 2.5 MG/0.5ML ~~LOC~~ SOAJ: 2.5000 mg | SUBCUTANEOUS | 0 refills | Status: DC

## 2022-09-11 NOTE — Patient Instructions (Addendum)
It was great to see you!  Flu shot today  We will try to start Zepbound  Let's follow-up in 1 month, sooner if you have concerns.  Take care,  Jarold Motto PA-C

## 2022-09-11 NOTE — Progress Notes (Signed)
Emma Franco is a 42 y.o. female here to establish care.  History of Present Illness:   Chief Complaint  Patient presents with   Establish Care   Weight Gain   Anxiety    Pt would like to discuss medication    HPI  Obesity Reports that she has noticed weight gain within past 2-3 years; notes that after starting Wellbutrin she initially lost a lot of weight, but she is now struggling to maintain her weight.  Discussed interest in medication intervention.  States that she walks as much as possible, but her scoliosis impacts her ability to exercise, and she is trying to eat healthier.  Anxiety/Depression: Managed by 20 mg Lexapro (for about 7 years) and 200 mg Wellbutrin SR twice daily lost weight initially. Reports that with alternatives to Lexapro caused side effects. With Paxil she experienced blurred vision, and Zoloft caused excessive jitters.  Describes moments where she becomes excited for upcoming plans until immediately prior when she starts becoming anxious.  Expressed that she's not sure how effective her medication is since she feels like she still experiences a lot of anxiety.  States that she is not established with therapist.  Denies SI/HI.   Lupus: Managed with 200 mg Plaquenil 5 days/week. Reports that she was diagnosed 5 years ago.  Describes initial symptoms: extreme fatigue, muscle weakness, extreme eye dryness, photophobia, hair loss; after a lot of sun exposure she'll a splotchy rash appears.  States symptoms have overall improved, hair loss persisting.    Past Medical History:  Diagnosis Date   Allergy    Anxiety    Arthritis    "in my back"   Asthma    Chronic sinusitis 05/21/2017   s/p surgery, also complicated by CSF leak, which was repaired    Environmental allergies    Esophageal reflux    ESOPHAGITIS 03/18/2008   Qualifier: Diagnosis of  By: Koleen Distance CMA (AAMA), Hulan Saas     Family history of rheumatoid arthritis 05/21/2017   Mother    Headache(784.0) 07/18/2011   "qd for the last year; CSF leak; repaired today"   Hiatal hernia    History of miscarriage 06/26/2017   In first trimester   Irritable bowel syndrome    Lupus (HCC)    Migraines    "once in a blue moon"   Scoliosis    mild   Sinusitis, chronic      Social History   Tobacco Use   Smoking status: Never    Passive exposure: Past   Smokeless tobacco: Never  Vaping Use   Vaping status: Never Used  Substance Use Topics   Alcohol use: Yes    Comment: social   Drug use: No    Past Surgical History:  Procedure Laterality Date   CESAREAN SECTION  03/2009   CESAREAN SECTION Bilateral 10/16/2013   Procedure: REPEAT CESAREAN SECTION WITH BILATERAL TUBAL LIGATION ;  Surgeon: Tresa Endo A. Ernestina Penna, MD;  Location: [facility];  Service: Obstetrics;  Laterality: Bilateral;   EDD: 10/26/13   CHOLECYSTECTOMY  12/1996   NASAL SEPTUM SURGERY  2006   NASAL SINUS SURGERY  03/2010; 07/18/11   REPAIR DURAL / CSF LEAK  07/18/2011   SINUS ENDO W/FUSION  07/18/2011   Procedure: ENDOSCOPIC SINUS SURGERY WITH FUSION NAVIGATION;  Surgeon: Melvenia Beam, MD;  Location: [facility] OR;  Service: ENT;  Laterality: N/A;   TONSILLECTOMY  1990   TUBAL LIGATION      Family History  Problem Relation Age of Onset  Rheum arthritis Mother    Multiple sclerosis Mother    Breast cancer Mother    Arthritis Mother    Stroke Maternal Grandmother    Stroke Paternal Grandmother     Allergies  Allergen Reactions   Cefdinir Swelling   Ciprofloxacin Nausea And Vomiting   Ciprofibrate Nausea And Vomiting    Current Medications:   Current Outpatient Medications:    buPROPion (WELLBUTRIN SR) 200 MG 12 hr tablet, Take 1 tablet (200 mg total) by mouth 2 (two) times daily., Disp: 60 tablet, Rfl: 5   cetirizine (ZYRTEC) 10 MG tablet, Take 10 mg by mouth daily., Disp: , Rfl:    Cholecalciferol (VITAMIN D) 50 MCG (2000 UT) CAPS, Take 1 capsule by mouth daily in the afternoon., Disp: , Rfl:     cycloSPORINE (RESTASIS) 0.05 % ophthalmic emulsion, 1 drop 2 (two) times daily., Disp: , Rfl:    escitalopram (LEXAPRO) 20 MG tablet, Take 1 tablet (20 mg total) by mouth daily., Disp: 30 tablet, Rfl: 5   fluticasone (FLONASE) 50 MCG/ACT nasal spray, Place 1 spray into both nostrils as needed., Disp: 18.2 mL, Rfl: 11   hydroxychloroquine (PLAQUENIL) 200 MG tablet, Take 1 tablet by mouth twice daily Monday through Friday., Disp: 120 tablet, Rfl: 0   meclizine (ANTIVERT) 25 MG tablet, Take 1 tablet (25 mg total) by mouth 3 (three) times daily as needed for dizziness., Disp: 30 tablet, Rfl: 0   tirzepatide (ZEPBOUND) 2.5 MG/0.5ML Pen, Inject 2.5 mg into the skin once a week., Disp: 2 mL, Rfl: 0   Review of Systems:   Review of Systems  Constitutional:        (+) Weight Gain (past 2-3 years)  Psychiatric/Behavioral:  Positive for depression (see HPI). The patient is nervous/anxious (see HPI).     Vitals:   Vitals:   09/11/22 0806  BP: 120/80  Pulse: 85  Temp: (!) 97.5 F (36.4 C)  TempSrc: Temporal  SpO2: 98%  Weight: 204 lb 6.1 oz (92.7 kg)  Height: 5\' 5"  (1.651 m)     Body mass index is 34.01 kg/m.  Physical Exam:   Physical Exam Vitals and nursing note reviewed.  Constitutional:      General: She is not in acute distress.    Appearance: She is well-developed. She is not ill-appearing or toxic-appearing.  Cardiovascular:     Rate and Rhythm: Normal rate and regular rhythm.     Pulses: Normal pulses.     Heart sounds: Normal heart sounds, S1 normal and S2 normal.  Pulmonary:     Effort: Pulmonary effort is normal.     Breath sounds: Normal breath sounds.  Skin:    General: Skin is warm and dry.  Neurological:     Mental Status: She is alert.     GCS: GCS eye subscore is 4. GCS verbal subscore is 5. GCS motor subscore is 6.  Psychiatric:        Speech: Speech normal.        Behavior: Behavior normal. Behavior is cooperative.     Assessment and Plan:   Other forms  of systemic lupus erythematosus, unspecified organ involvement status (HCC) Reviewed most recent note from rheumatology Continue regular, consistent care  Anxiety Overall controlled but room for improvement For now, we will continue 20 mg Lexapro (for about 7 years) and 200 mg Wellbutrin SR twice daily -- we are going to try to start Zepbound for weight loss and due to history of not tolerating psychiatric medication  changes well, we will hold off on too many changes Follow-up in 1 month, sooner if concerns  Encounter for lipid screening for cardiovascular disease Update lipid panel  Encounter for immunization Flu vaccine given today  Obesity, unspecified classification, unspecified obesity type, unspecified whether serious comorbidity present Will trial Zepbound 2.5 mg weekly Follow-up in 1 month, sooner if concerns Reviewed risks/benefits/side effect(s) of medication  I,Emily Lagle,acting as a scribe for [employer], PA.,have documented all relevant documentation on the behalf of Jarold Motto, PA,as directed by  Jarold Motto, PA while in the presence of Jarold Motto, [place].  I, Jarold Motto, [place], have reviewed all documentation for this visit. The documentation on 09/11/22 for the exam, diagnosis, procedures, and orders are all accurate and complete.   Jarold Motto, PA-C

## 2022-09-21 ENCOUNTER — Encounter: Payer: Self-pay | Admitting: Physician Assistant

## 2022-09-24 NOTE — Telephone Encounter (Signed)
PA needed for Zepbound 2.5 mg

## 2022-09-26 ENCOUNTER — Telehealth: Payer: Self-pay

## 2022-09-26 NOTE — Telephone Encounter (Signed)
Pharmacy Patient Advocate Encounter   Received notification from Patient Advice Request messages that prior authorization for Zepbound 2.5MG /0.5ML pen-injectors is required/requested.   Insurance verification completed.   The patient is insured through [facility] .   Per test claim: PA required; PA submitted to [facility] via CoverMyMeds Key/confirmation #/EOC  [id number] Status is pending

## 2022-09-27 NOTE — Telephone Encounter (Signed)
Sent pt My Chart message with response. Zepbound denied.

## 2022-09-27 NOTE — Telephone Encounter (Signed)
Pharmacy Patient Advocate Encounter  Received notification from [facility] that Prior Authorization for Zepbound 2.5MG /0.5ML pen-injectors has been DENIED.  Full denial letter will be uploaded to the media tab. See denial reason below.   PA #/Case ID/Reference #: [id number]    DENIAL REASON: The requested medication and/or diagnosis are not a covered benefit and excluded from coverage in accordance with the terms and conditions of your plan benefit. Therefore, the request has been administratively denied.

## 2022-10-10 NOTE — Progress Notes (Signed)
Emma Franco is a 42 y.o. female here for a follow up of a pre-existing problem.  History of Present Illness:   Chief Complaint  Patient presents with   Obesity    Zepbound was denied, would like to discuss other options.    HPI  Attention Problems She is having trouble staying focused when working. She works from home. Has had concerns in the past about possible ADHD  Obesity Unable to get Zepbound due to insurance She walks as much as she can. She is still having trouble losing weight. She has lost 2 pounds since 09/11/22. Wonders if she should try phentermine.    Past Medical History:  Diagnosis Date   Allergy    Anxiety    Arthritis    "in my back"   Asthma    Chronic sinusitis 05/21/2017   s/p surgery, also complicated by CSF leak, which was repaired    Environmental allergies    Esophageal reflux    ESOPHAGITIS 03/18/2008   Qualifier: Diagnosis of  By: Koleen Distance CMA (AAMA), Hulan Saas     Family history of rheumatoid arthritis 05/21/2017   Mother   Headache(784.0) 07/18/2011   "qd for the last year; CSF leak; repaired today"   Hiatal hernia    History of miscarriage 06/26/2017   In first trimester   Irritable bowel syndrome    Lupus    Migraines    "once in a blue moon"   Scoliosis    mild   Sinusitis, chronic      Social History   Tobacco Use   Smoking status: Never    Passive exposure: Past   Smokeless tobacco: Never  Vaping Use   Vaping status: Never Used  Substance Use Topics   Alcohol use: Yes    Comment: social   Drug use: No    Past Surgical History:  Procedure Laterality Date   CESAREAN SECTION  03/2009   CESAREAN SECTION Bilateral 10/16/2013   Procedure: REPEAT CESAREAN SECTION WITH BILATERAL TUBAL LIGATION ;  Surgeon: Tresa Endo A. Ernestina Penna, MD;  Location: [facility];  Service: Obstetrics;  Laterality: Bilateral;   EDD: 10/26/13   CHOLECYSTECTOMY  12/1996   NASAL SEPTUM SURGERY  2006   NASAL SINUS SURGERY  03/2010; 07/18/11   REPAIR  DURAL / CSF LEAK  07/18/2011   SINUS ENDO W/FUSION  07/18/2011   Procedure: ENDOSCOPIC SINUS SURGERY WITH FUSION NAVIGATION;  Surgeon: Melvenia Beam, MD;  Location: [facility] OR;  Service: ENT;  Laterality: N/A;   TONSILLECTOMY  1990   TUBAL LIGATION      Family History  Problem Relation Age of Onset   Rheum arthritis Mother    Multiple sclerosis Mother    Breast cancer Mother    Arthritis Mother    Stroke Maternal Grandmother    Stroke Paternal Grandmother     Allergies  Allergen Reactions   Cefdinir Swelling   Ciprofloxacin Nausea And Vomiting   Ciprofibrate Nausea And Vomiting    Current Medications:   Current Outpatient Medications:    buPROPion (WELLBUTRIN SR) 200 MG 12 hr tablet, Take 1 tablet (200 mg total) by mouth 2 (two) times daily., Disp: 60 tablet, Rfl: 5   cetirizine (ZYRTEC) 10 MG tablet, Take 10 mg by mouth daily., Disp: , Rfl:    Cholecalciferol (VITAMIN D) 50 MCG (2000 UT) CAPS, Take 1 capsule by mouth daily in the afternoon., Disp: , Rfl:    cycloSPORINE (RESTASIS) 0.05 % ophthalmic emulsion, 1 drop 2 (two) times daily.,  Disp: , Rfl:    escitalopram (LEXAPRO) 20 MG tablet, Take 1 tablet (20 mg total) by mouth daily., Disp: 30 tablet, Rfl: 5   fluticasone (FLONASE) 50 MCG/ACT nasal spray, Place 1 spray into both nostrils as needed., Disp: 18.2 mL, Rfl: 11   hydroxychloroquine (PLAQUENIL) 200 MG tablet, Take 1 tablet by mouth twice daily Monday through Friday., Disp: 120 tablet, Rfl: 0   meclizine (ANTIVERT) 25 MG tablet, Take 1 tablet (25 mg total) by mouth 3 (three) times daily as needed for dizziness., Disp: 30 tablet, Rfl: 0   phentermine (ADIPEX-P) 37.5 MG tablet, Take 1/2 tablet daily x 1-2 weeks, then increase to full tablet daily, Disp: 30 tablet, Rfl: 2   Review of Systems:   Review of Systems  Constitutional:  Positive for malaise/fatigue. Negative for fever.  HENT:  Negative for congestion.   Eyes:  Negative for blurred vision.  Respiratory:  Negative  for cough and shortness of breath.   Cardiovascular:  Negative for chest pain, palpitations and leg swelling.  Gastrointestinal:  Negative for vomiting.  Musculoskeletal:  Negative for back pain.  Skin:  Negative for rash.  Neurological:  Negative for loss of consciousness and headaches.    Vitals:   Vitals:   10/17/22 0817  BP: 110/80  Pulse: 83  Temp: (!) 97.1 F (36.2 C)  TempSrc: Temporal  SpO2: 98%  Weight: 202 lb 4 oz (91.7 kg)  Height: 5\' 5"  (1.651 m)     Body mass index is 33.66 kg/m.  Physical Exam:   Physical Exam Vitals and nursing note reviewed.  Constitutional:      General: She is not in acute distress.    Appearance: She is well-developed. She is not ill-appearing or toxic-appearing.  Cardiovascular:     Rate and Rhythm: Normal rate and regular rhythm.     Pulses: Normal pulses.     Heart sounds: Normal heart sounds, S1 normal and S2 normal.  Pulmonary:     Effort: Pulmonary effort is normal.     Breath sounds: Normal breath sounds.  Skin:    General: Skin is warm and dry.  Neurological:     Mental Status: She is alert.     GCS: GCS eye subscore is 4. GCS verbal subscore is 5. GCS motor subscore is 6.  Psychiatric:        Speech: Speech normal.        Behavior: Behavior normal. Behavior is cooperative.     Assessment and Plan:   Attention or concentration deficit Uncontrolled Adult ADHD Self-Report Scale with concerns for possible ADHD Will continue to monitor  Obesity, unspecified class, unspecified obesity type, unspecified whether serious comorbidity present Unfortunately she cannot get coverage for GLP-1 She is interested in phentermine -- risks/benefits/side effect(s) discussed and recommend stop medication if any concerning side effects Follow-up in 3 months, sooner if concerns  I,Alexander Ruley,acting as a scribe for [employer], PA.,have documented all relevant documentation on the behalf of Jarold Motto, PA,as directed by   Jarold Motto, PA while in the presence of Jarold Motto, [place].  I, Jarold Motto, [place], have reviewed all documentation for this visit. The documentation on 10/17/22 for the exam, diagnosis, procedures, and orders are all accurate and complete.   Jarold Motto, PA-C

## 2022-10-17 ENCOUNTER — Encounter: Payer: Self-pay | Admitting: Physician Assistant

## 2022-10-17 ENCOUNTER — Ambulatory Visit: Payer: 59 | Admitting: Physician Assistant

## 2022-10-17 VITALS — BP 110/80 | HR 83 | Temp 97.1°F | Ht 65.0 in | Wt 202.2 lb

## 2022-10-17 DIAGNOSIS — Z6833 Body mass index (BMI) 33.0-33.9, adult: Secondary | ICD-10-CM

## 2022-10-17 DIAGNOSIS — E669 Obesity, unspecified: Secondary | ICD-10-CM

## 2022-10-17 DIAGNOSIS — R41840 Attention and concentration deficit: Secondary | ICD-10-CM | POA: Diagnosis not present

## 2022-10-17 MED ORDER — PHENTERMINE HCL 37.5 MG PO TABS: ORAL_TABLET | ORAL | 2 refills | Status: DC

## 2022-10-17 NOTE — Patient Instructions (Signed)
It was great to see you!  Start phentermine -- if ANY side effect(s) -- STOP  Drink plenty of water  Let's follow-up in 3 months, sooner if you have concerns.  Take care,  Jarold Motto PA-C

## 2022-10-23 ENCOUNTER — Other Ambulatory Visit: Payer: Self-pay | Admitting: Physician Assistant

## 2022-10-23 DIAGNOSIS — M3219 Other organ or system involvement in systemic lupus erythematosus: Secondary | ICD-10-CM

## 2022-10-23 DIAGNOSIS — M359 Systemic involvement of connective tissue, unspecified: Secondary | ICD-10-CM

## 2022-10-23 NOTE — Telephone Encounter (Signed)
Last Fill: 07/04/2022  Eye exam: 04/28/2021 WNL    Labs: 07/04/2022 Absolute eosinophils are borderline elevated. Rest of CBC WNL. CMP WNL  Next Visit: 11/27/2022  Last Visit: 07/04/2022  DX: Other systemic lupus erythematosus with other organ involvement   Current Dose per office note 07/04/2022: Plaquenil 200 mg 1 tablet by mouth twice daily Monday through Friday only.   Patient states she has had her PLQ eye exam update. Patient will call to have the results sent to our office.   Okay to refill Plaquenil?

## 2022-11-13 NOTE — Progress Notes (Signed)
Office Visit Note  Patient: Emma Franco             Date of Birth: 1980-11-03           MRN: [id number]             PCP: Jarold Motto, PA Referring: No ref. provider found Visit Date: 11/27/2022 Occupation: @GUAROCC @  Subjective:  Trapezius muscle spasms   History of Present Illness: Emma Franco is a 42 y.o. female with history of systemic lupus and discoid lupus.  Patient remains on Plaquenil 200 mg 1 tablet by mouth twice daily M-F.  She is tolerating Plaquenil without any side effects and has not missed any doses recently.  She denies any signs or symptoms of a systemic lupus flare.  She continues to experience chronic muscle pain, fatigue, and hair loss.  She has not noticed any new symptoms recently.  She is currently experiencing increased trapezius muscle tension and tenderness bilaterally.  Has been experiencing muscle spasms intermittently.  She has requested trapezius trigger point injections today.  She denies any joint swelling at this time.  She has not had any recent rashes, discoid lesions, or Raynaud's phenomenon.  She denies any oral or nasal ulcerations.  She denies any swollen lymph nodes.  She continues to have chronic sicca symptoms.  She has been using Restasis for dry eyes.   Activities of Daily Living:  Patient reports morning stiffness for 30-60 minutes.   Patient Denies nocturnal pain.  Difficulty dressing/grooming: Denies Difficulty climbing stairs: Reports Difficulty getting out of chair: Denies Difficulty using hands for taps, buttons, cutlery, and/or writing: Denies  Review of Systems  Constitutional:  Positive for fatigue.  HENT:  Positive for mouth dryness. Negative for mouth sores.   Eyes:  Positive for dryness.  Respiratory:  Positive for shortness of breath.   Cardiovascular:  Negative for chest pain and palpitations.  Gastrointestinal:  Positive for constipation and diarrhea. Negative for blood in stool.  Endocrine: Negative  for increased urination.  Genitourinary:  Negative for involuntary urination.  Musculoskeletal:  Positive for muscle weakness, morning stiffness and muscle tenderness. Negative for joint pain, gait problem, joint pain, joint swelling, myalgias and myalgias.  Skin:  Positive for hair loss and sensitivity to sunlight. Negative for color change and rash.  Allergic/Immunologic: Negative for susceptible to infections.  Neurological:  Positive for dizziness and headaches.  Hematological:  Negative for swollen glands.  Psychiatric/Behavioral:  Negative for depressed mood and sleep disturbance. The patient is nervous/anxious.     PMFS History:  Patient Active Problem List   Diagnosis Date Noted   Cyst of ovary 05/18/2020   Cerebrospinal fluid leak 05/18/2020   Anxiety 05/18/2020   Recurrent major depressive disorder, in full remission (HCC) 11/19/2019   Other forms of systemic lupus erythematosus (HCC) 03/05/2019   Autoimmune disease (HCC) 06/26/2017   Scoliosis 05/21/2017   Gastroesophageal reflux disease 03/18/2008   Diaphragmatic hernia 03/18/2008   Irritable bowel syndrome 02/06/2008   Arthropathy 02/06/2008    Past Medical History:  Diagnosis Date   Allergy    Anxiety    Arthritis    "in my back"   Asthma    Chronic sinusitis 05/21/2017   s/p surgery, also complicated by CSF leak, which was repaired    Environmental allergies    Esophageal reflux    ESOPHAGITIS 03/18/2008   Qualifier: Diagnosis of  By: Koleen Distance CMA (AAMA), Leisha     Family history of rheumatoid arthritis 05/21/2017  Mother   Headache(784.0) 07/18/2011   "qd for the last year; CSF leak; repaired today"   Hiatal hernia    History of miscarriage 06/26/2017   In first trimester   Irritable bowel syndrome    Lupus    Migraines    "once in a blue moon"   Scoliosis    mild   Sinusitis, chronic     Family History  Problem Relation Age of Onset   Rheum arthritis Mother    Multiple sclerosis Mother     Breast cancer Mother    Arthritis Mother    Stroke Maternal Grandmother    Stroke Paternal Grandmother    Past Surgical History:  Procedure Laterality Date   CESAREAN SECTION  03/2009   CESAREAN SECTION Bilateral 10/16/2013   Procedure: REPEAT CESAREAN SECTION WITH BILATERAL TUBAL LIGATION ;  Surgeon: Tresa Endo A. Ernestina Penna, MD;  Location: [facility];  Service: Obstetrics;  Laterality: Bilateral;   EDD: 10/26/13   CHOLECYSTECTOMY  12/1996   NASAL SEPTUM SURGERY  2006   NASAL SINUS SURGERY  03/2010; 07/18/11   REPAIR DURAL / CSF LEAK  07/18/2011   SINUS ENDO W/FUSION  07/18/2011   Procedure: ENDOSCOPIC SINUS SURGERY WITH FUSION NAVIGATION;  Surgeon: Melvenia Beam, MD;  Location: [facility];  Service: ENT;  Laterality: N/A;   TONSILLECTOMY  1990   TUBAL LIGATION     Social History   Social History Narrative   Lives with husband Casimiro Needle (lineman for [employer])   2 girls: Rylee 13 and Stella 9    Both daughters are very involved in sports   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      Dog: Gpys- Double Doodle   Immunization History  Administered Date(s) Administered   Influenza Split 10/01/2008, 10/08/2012   Influenza, Seasonal, Injecte, Preservative Fre 11/06/2011, 10/12/2013, 10/09/2014, 09/11/2022   Influenza,inj,Quad PF,6+ Mos 10/18/2019   Influenza-Unspecified 09/30/2017, 09/17/2018, 10/20/2021, 10/30/2021   Meningococcal polysaccharide vaccine (MPSV4) 07/19/2011   PFIZER(Purple Top)SARS-COV-2 Vaccination 09/03/2019, 09/24/2019   Pneumococcal Conjugate-13 11/11/2017   Pneumococcal Polysaccharide-23 07/19/2011   Tdap 04/27/2010, 08/18/2013     Objective: Vital Signs: BP 111/83 (BP Location: Left Arm, Patient Position: Sitting, Cuff Size: Normal)   Pulse (!) 102   Resp 14   Ht 5\' 5"  (1.651 m)   Wt 193 lb (87.5 kg)   LMP 11/26/2022   BMI 32.12 kg/m    Physical Exam Vitals and nursing note reviewed.  Constitutional:      Appearance: She is well-developed.  HENT:     Head:  Normocephalic and atraumatic.  Eyes:     Conjunctiva/sclera: Conjunctivae normal.  Cardiovascular:     Rate and Rhythm: Normal rate and regular rhythm.     Heart sounds: Normal heart sounds.  Pulmonary:     Effort: Pulmonary effort is normal.     Breath sounds: Normal breath sounds.  Abdominal:     General: Bowel sounds are normal.     Palpations: Abdomen is soft.  Musculoskeletal:     Cervical back: Normal range of motion.  Lymphadenopathy:     Cervical: No cervical adenopathy.  Skin:    General: Skin is warm and dry.     Capillary Refill: Capillary refill takes less than 2 seconds.  Neurological:     Mental Status: She is alert and oriented to person, place, and time.  Psychiatric:        Behavior: Behavior normal.      Musculoskeletal Exam: C-spine has good ROM.  Trapezius muscle tension and tenderness bilaterally.  Thoracolumbar scoliosis noted.  Shoulder joints, elbow joints, wrist joints, MCPs, PIPs, and DIPs good ROM with no synovitis.  Complete fist formation bilaterally.  Hip joints have painful ROM.  Knee joints have good ROM with no warmth or effusion.  Ankle joints have good ROM with no tenderness or swelling.    CDAI Exam: CDAI Score: -- Patient Global: --; Provider Global: -- Swollen: --; Tender: -- Joint Exam 11/27/2022   No joint exam has been documented for this visit   There is currently no information documented on the homunculus. Go to the Rheumatology activity and complete the homunculus joint exam.  Investigation: No additional findings.  Imaging: No results found.  Recent Labs: Lab Results  Component Value Date   WBC 5.3 07/04/2022   HGB 13.5 07/04/2022   PLT 308 07/04/2022   NA 141 07/04/2022   K 3.8 07/04/2022   CL 104 07/04/2022   CO2 28 07/04/2022   GLUCOSE 70 07/04/2022   BUN 5 (L) 07/04/2022   CREATININE 0.83 07/04/2022   BILITOT 0.3 07/04/2022   ALKPHOS 131 (H) 09/11/2013   AST 15 07/04/2022   ALT 14 07/04/2022   PROT 6.4  07/04/2022   ALBUMIN 2.7 (L) 09/11/2013   CALCIUM 9.3 07/04/2022   GFRAA 103 07/07/2020   QFTBGOLDPLUS NEGATIVE 03/24/2019    Speciality Comments: PLQ Eye Exam:05/29/2022 WNL Dr. Jimmye Norman f/u 12 months   Procedures:  Trigger Point Inj  Date/Time: 11/27/2022 8:27 AM  Performed by: Gearldine Bienenstock, PA-C Authorized by: Gearldine Bienenstock, PA-C   Consent Given by:  Patient Site marked: the procedure site was marked   Timeout: prior to procedure the correct patient, procedure, and site was verified   Indications:  Pain Total # of Trigger Points:  2 Location: neck   Needle Size:  27 G Approach:  Dorsal Medications #1:  0.5 mL lidocaine 1 %; 10 mg triamcinolone acetonide 40 MG/ML Medications #2:  0.5 mL lidocaine 1 %; 10 mg triamcinolone acetonide 40 MG/ML Patient tolerance:  Patient tolerated the procedure well with no immediate complications  Allergies: Cefdinir, Ciprofloxacin, and Ciprofibrate     Assessment / Plan:     Visit Diagnoses: Other systemic lupus erythematosus with other organ involvement (HCC) - ANA positive, dsDNA 48, rest of the ENA negative, C3-C4 normal.  History of fatigue, hair loss, arthralgias, oral ulcers, sicca symptoms and photosensitivity:  She is not experiencing any signs or symptoms of a systemic lupus flare.  She has clinically been doing well taking Plaquenil 200 mg 1 tablet by mouth twice daily Monday through Friday.  She is tolerating Plaquenil without any side effects and has not had any gaps in therapy.  No recurrence of discoid lesions.  She has not had any recent rashes or symptoms of Raynaud's phenomenon.  No oral or nasal ulcerations.  No cervical lymphadenopathy noted today.  She has been experiencing increased sicca symptoms and continues to use Restasis for dry eyes.  Discussed the use of over-the-counter products and a humidifier.  Patient continues to have chronic fatigue, muscle pain, and hair loss which have been unchanged. Lab work on  07/04/22 was reviewed today in the office: dsDNA 10, complements WNL, vitamin D Wnl, protein creatinine ratio WNL, and ESR WNL.  Discussed that labs were not consistent with a flare.  The following lab work will be updated today.  She will remain on Plaquenil as prescribed.  She was advised to notify us if  she develops signs or symptoms of a flare.  She will follow-up in the office in 5 months or sooner if needed. - Plan: Protein / creatinine ratio, urine, CBC with Differential/Platelet, COMPLETE METABOLIC PANEL WITH GFR, Anti-DNA antibody, double-stranded, C3 and C4, Sedimentation rate  High risk medication use - Plaquenil 200 mg 1 tablet by mouth twice daily M-F. CBC and CMP updated on 07/04/22.  Orders for CBC and CMP released today.   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  Sicca complex [facility]): She continues to have chronic sicca symptoms.  She has been using Restasis eyedrops for dry eyes.  Discussed the use of a humidifier and mask at night.  Also discussed the use of over-the-counter products including Biotene products.  Other fatigue: Patient continues to experience chronic fatigue.   Trapezius muscle spasm: She presents today with trapezius muscle tension and tenderness bilaterally.  She has been experiencing a flare with more frequent muscle spasms.  She has been having to see her chiropractor more frequently.  Patient requested trapezius trigger point injections today.  She tolerated procedures well.  Procedure note was completed above.  Aftercare was discussed.  She was advised to notify us if her symptoms persist or worsen. A prescription for methocarbamol 500 mg 1 tablet daily as needed for muscle spasms will be sent to the pharmacy today.  Other form of scoliosis of lumbar spine: Chronic pain.  Patient has been seeing a chiropractor on a regular basis and going for a massage monthly.  She experiences muscle  spasms intermittently, so a prescription for methocarbamol 500 mg 1 tablet daily as needed for muscle spasms will be sent to the pharmacy today.  She was advised to notify us if she cannot tolerate taking methocarbamol or finds it to be ineffective.  Other medical conditions are listed as follows:  Vitamin D deficiency: She is taking vitamin D 2000 units daily.    History of IBS - Evaluated by Dr. Loreta Ave  History of gastroesophageal reflux (GERD)  Hiatal hernia  History of anxiety    Orders: Orders Placed This Encounter  Procedures   Trigger Point Inj   Protein / creatinine ratio, urine   CBC with Differential/Platelet   COMPLETE METABOLIC PANEL WITH GFR   Anti-DNA antibody, double-stranded   C3 and C4   Sedimentation rate   Meds ordered this encounter  Medications   methocarbamol (ROBAXIN) 500 MG tablet    Sig: Take 1 tablet (500 mg total) by mouth daily as needed.    Dispense:  30 tablet    Refill:  0      Follow-Up Instructions: Return in about 5 months (around 04/27/2023) for Systemic lupus erythematosus.   Gearldine Bienenstock, PA-C  Note - This record has been created using Dragon software.  Chart creation errors have been sought, but may not always  have been located. Such creation errors do not reflect on  the standard of medical care.

## 2022-11-27 ENCOUNTER — Encounter: Payer: Self-pay | Admitting: Physician Assistant

## 2022-11-27 ENCOUNTER — Ambulatory Visit: Payer: 59 | Attending: Physician Assistant | Admitting: Physician Assistant

## 2022-11-27 VITALS — BP 111/83 | HR 102 | Resp 14 | Ht 65.0 in | Wt 193.0 lb

## 2022-11-27 DIAGNOSIS — Z79899 Other long term (current) drug therapy: Secondary | ICD-10-CM | POA: Diagnosis not present

## 2022-11-27 DIAGNOSIS — Z8719 Personal history of other diseases of the digestive system: Secondary | ICD-10-CM

## 2022-11-27 DIAGNOSIS — E559 Vitamin D deficiency, unspecified: Secondary | ICD-10-CM

## 2022-11-27 DIAGNOSIS — M3219 Other organ or system involvement in systemic lupus erythematosus: Secondary | ICD-10-CM | POA: Diagnosis not present

## 2022-11-27 DIAGNOSIS — M3500 Sicca syndrome, unspecified: Secondary | ICD-10-CM

## 2022-11-27 DIAGNOSIS — K449 Diaphragmatic hernia without obstruction or gangrene: Secondary | ICD-10-CM

## 2022-11-27 DIAGNOSIS — M4186 Other forms of scoliosis, lumbar region: Secondary | ICD-10-CM | POA: Diagnosis not present

## 2022-11-27 DIAGNOSIS — R5383 Other fatigue: Secondary | ICD-10-CM | POA: Diagnosis not present

## 2022-11-27 DIAGNOSIS — M62838 Other muscle spasm: Secondary | ICD-10-CM | POA: Diagnosis not present

## 2022-11-27 DIAGNOSIS — L930 Discoid lupus erythematosus: Secondary | ICD-10-CM

## 2022-11-27 DIAGNOSIS — Z8659 Personal history of other mental and behavioral disorders: Secondary | ICD-10-CM

## 2022-11-27 MED ORDER — METHOCARBAMOL 500 MG PO TABS: 500.0000 mg | ORAL_TABLET | Freq: Every day | ORAL | 0 refills | Status: DC | PRN

## 2022-11-27 MED ORDER — LIDOCAINE HCL 1 % IJ SOLN
0.5000 mL | INTRAMUSCULAR | Status: AC | PRN
  Administered 2022-11-27: .5 mL

## 2022-11-27 MED ORDER — TRIAMCINOLONE ACETONIDE 40 MG/ML IJ SUSP
10.0000 mg | INTRAMUSCULAR | Status: AC | PRN
  Administered 2022-11-27: 10 mg via INTRAMUSCULAR

## 2022-11-27 NOTE — Progress Notes (Signed)
RBC count is borderline elevated. Rest of CBC WNL.

## 2022-11-28 LAB — CBC WITH DIFFERENTIAL/PLATELET
Absolute Lymphocytes: 1480 {cells}/uL (ref 850–3900)
Absolute Monocytes: 500 {cells}/uL (ref 200–950)
Basophils Absolute: 49 {cells}/uL (ref 0–200)
Basophils Relative: 1 %
Eosinophils Absolute: 250 {cells}/uL (ref 15–500)
Eosinophils Relative: 5.1 %
HCT: 44.1 % (ref 35.0–45.0)
Hemoglobin: 14.5 g/dL (ref 11.7–15.5)
MCH: 28.4 pg (ref 27.0–33.0)
MCHC: 32.9 g/dL (ref 32.0–36.0)
MCV: 86.3 fL (ref 80.0–100.0)
MPV: 11.1 fL (ref 7.5–12.5)
Monocytes Relative: 10.2 %
Neutro Abs: 2622 {cells}/uL (ref 1500–7800)
Neutrophils Relative %: 53.5 %
Platelets: 274 10*3/uL (ref 140–400)
RBC: 5.11 10*6/uL — ABNORMAL HIGH (ref 3.80–5.10)
RDW: 12.3 % (ref 11.0–15.0)
Total Lymphocyte: 30.2 %
WBC: 4.9 10*3/uL (ref 3.8–10.8)

## 2022-11-28 LAB — COMPLETE METABOLIC PANEL WITH GFR
AG Ratio: 1.4 (calc) (ref 1.0–2.5)
ALT: 9 U/L (ref 6–29)
AST: 13 U/L (ref 10–30)
Albumin: 4 g/dL (ref 3.6–5.1)
Alkaline phosphatase (APISO): 61 U/L (ref 31–125)
BUN/Creatinine Ratio: 7 (calc) (ref 6–22)
BUN: 6 mg/dL — ABNORMAL LOW (ref 7–25)
CO2: 29 mmol/L (ref 20–32)
Calcium: 9.2 mg/dL (ref 8.6–10.2)
Chloride: 102 mmol/L (ref 98–110)
Creat: 0.88 mg/dL (ref 0.50–0.99)
Globulin: 2.8 g/dL (ref 1.9–3.7)
Glucose, Bld: 90 mg/dL (ref 65–99)
Potassium: 4.1 mmol/L (ref 3.5–5.3)
Sodium: 138 mmol/L (ref 135–146)
Total Bilirubin: 0.4 mg/dL (ref 0.2–1.2)
Total Protein: 6.8 g/dL (ref 6.1–8.1)
eGFR: 84 mL/min/{1.73_m2} (ref >=60)

## 2022-11-28 LAB — C3 AND C4
C3 Complement: 119 mg/dL (ref 83–193)
C4 Complement: 35 mg/dL (ref 15–57)

## 2022-11-28 LAB — PROTEIN / CREATININE RATIO, URINE
Creatinine, Urine: 131 mg/dL (ref 20–275)
Protein/Creat Ratio: 61 mg/g{creat} (ref 24–184)
Protein/Creatinine Ratio: 0.061 mg/mg{creat} (ref 0.024–0.184)
Total Protein, Urine: 8 mg/dL (ref 5–24)

## 2022-11-28 LAB — ANTI-DNA ANTIBODY, DOUBLE-STRANDED: ds DNA Ab: 12 [IU]/mL — ABNORMAL HIGH

## 2022-11-28 LAB — SEDIMENTATION RATE: Sed Rate: 2 mm/h (ref 0–20)

## 2022-11-28 NOTE — Progress Notes (Signed)
CMP WNL ESR WNL Urine protein creatinine ratio WNL

## 2022-11-28 NOTE — Progress Notes (Signed)
Complements WNL

## 2022-11-29 NOTE — Progress Notes (Signed)
dsDNA remains borderline elevated but stable.  No medication changes recommended at this time.

## 2023-01-11 ENCOUNTER — Encounter: Payer: Self-pay | Admitting: Physician Assistant

## 2023-01-11 ENCOUNTER — Ambulatory Visit: Payer: 59 | Admitting: Physician Assistant

## 2023-01-11 VITALS — BP 120/80 | HR 100 | Temp 97.2°F | Ht 65.0 in | Wt 194.5 lb

## 2023-01-11 DIAGNOSIS — Z6832 Body mass index (BMI) 32.0-32.9, adult: Secondary | ICD-10-CM

## 2023-01-11 DIAGNOSIS — E669 Obesity, unspecified: Secondary | ICD-10-CM

## 2023-01-11 DIAGNOSIS — R051 Acute cough: Secondary | ICD-10-CM

## 2023-01-11 MED ORDER — AMOXICILLIN-POT CLAVULANATE 875-125 MG PO TABS: 1.0000 | ORAL_TABLET | Freq: Two times a day (BID) | ORAL | 0 refills | Status: DC

## 2023-01-11 MED ORDER — LISDEXAMFETAMINE DIMESYLATE 30 MG PO CAPS: 30.0000 mg | ORAL_CAPSULE | Freq: Every day | ORAL | 0 refills | Status: DC

## 2023-01-11 NOTE — Progress Notes (Signed)
 Emma Franco is a 43 y.o. female here for a follow up of a pre-existing problem.  History of Present Illness:   Chief Complaint  Patient presents with   Obesity    Pt currently taking Phentermine  37.5 mg, doing well.    HPI  Obesity We started phentermine  on last visit 10/17/22 She is taking as prescribed She is up total 1 pound since last seeing us  however; during the first week, she felt like she had improvement of focus and she lost 9-10 pounds initially She continues efforts with exercise and healthy eating  Wt Readings from Last 3 Encounters:  01/11/23 194 lb 8 oz (88.2 kg)  11/27/22 193 lb (87.5 kg)  10/17/22 202 lb 4 oz (91.7 kg)   Body mass index is 32.37 kg/m.  Cough  For the past month has had a cough Mainly cough at night from post nasal drip  Coughing up clear Took mucinex with some relief Slight nasal congestion Symptoms were overall improving until a week ago, developed sinus pressure, increasing cough at night Hoarse voice No fever, chills  Daughter had a slight cough as well about 2 weeks ago but this has resolved  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   Asthma    Chronic sinusitis 05/21/2017   s/p surgery, also complicated by CSF leak, which was repaired    Environmental allergies    Esophageal reflux    ESOPHAGITIS 03/18/2008   Qualifier: Diagnosis of  By: Genie CMA (AAMA), Chick     Family history of rheumatoid arthritis 05/21/2017   Mother   Headache(784.0) 07/18/2011   qd for the last year; CSF leak; repaired today   Hiatal hernia    History of miscarriage 06/26/2017   In first trimester   Irritable bowel syndrome    Lupus    Migraines    once in a blue moon   Scoliosis    mild   Sinusitis, chronic      Social History   Tobacco Use   Smoking status: Never    Passive exposure: Past   Smokeless tobacco: Never  Vaping Use   Vaping status: Never Used  Substance  Use Topics   Alcohol use: Yes    Comment: social   Drug use: No    Past Surgical History:  Procedure Laterality Date   CESAREAN SECTION  03/2009   CESAREAN SECTION Bilateral 10/16/2013   Procedure: REPEAT CESAREAN SECTION WITH BILATERAL TUBAL LIGATION ;  Surgeon: Burnard A. Kandyce, MD;  Location: [facility];  Service: Obstetrics;  Laterality: Bilateral;   EDD: 10/26/13   CHOLECYSTECTOMY  12/1996   NASAL SEPTUM SURGERY  2006   NASAL SINUS SURGERY  03/2010; 07/18/11   REPAIR DURAL / CSF LEAK  07/18/2011   SINUS ENDO W/FUSION  07/18/2011   Procedure: ENDOSCOPIC SINUS SURGERY WITH FUSION NAVIGATION;  Surgeon: Merilee Kraft, MD;  Location[facility];  Service: ENT;  Laterality: N/A;   TONSILLECTOMY  1990   TUBAL LIGATION      Family History  Problem Relation Age of Onset   Rheum arthritis Mother    Multiple sclerosis Mother    Breast cancer Mother    Arthritis Mother    Stroke Maternal Grandmother    Stroke Paternal Grandmother     Allergies  Allergen Reactions   Cefdinir  Swelling   Ciprofloxacin Nausea And Vomiting   Ciprofibrate Nausea And Vomiting  Current Medications:   Current Outpatient Medications:    amoxicillin -clavulanate (AUGMENTIN ) 875-125 MG tablet, Take 1 tablet by mouth 2 (two) times daily., Disp: 14 tablet, Rfl: 0   buPROPion  (WELLBUTRIN  SR) 200 MG 12 hr tablet, Take 1 tablet (200 mg total) by mouth 2 (two) times daily., Disp: 60 tablet, Rfl: 5   cetirizine (ZYRTEC) 10 MG tablet, Take 10 mg by mouth daily., Disp: , Rfl:    Cholecalciferol (VITAMIN D ) 50 MCG (2000 UT) CAPS, Take 1 capsule by mouth daily in the afternoon., Disp: , Rfl:    cycloSPORINE (RESTASIS) 0.05 % ophthalmic emulsion, 1 drop 2 (two) times daily., Disp: , Rfl:    escitalopram  (LEXAPRO ) 20 MG tablet, Take 1 tablet (20 mg total) by mouth daily., Disp: 30 tablet, Rfl: 5   fluticasone  (FLONASE ) 50 MCG/ACT nasal spray, Place 1 spray into both nostrils as needed., Disp: 18.2 mL, Rfl: 11    hydroxychloroquine  (PLAQUENIL ) 200 MG tablet, TAKE 1 TABLET TWICE DAILY MONDAY THROUGH FRIDAY., Disp: 40 tablet, Rfl: 2   lisdexamfetamine (VYVANSE ) 30 MG capsule, Take 1 capsule (30 mg total) by mouth daily., Disp: 30 capsule, Rfl: 0   meclizine  (ANTIVERT ) 25 MG tablet, Take 1 tablet (25 mg total) by mouth 3 (three) times daily as needed for dizziness., Disp: 30 tablet, Rfl: 0   methocarbamol  (ROBAXIN ) 500 MG tablet, Take 1 tablet (500 mg total) by mouth daily as needed., Disp: 30 tablet, Rfl: 0   phentermine  (ADIPEX-P ) 37.5 MG tablet, Take 1/2 tablet daily x 1-2 weeks, then increase to full tablet daily (Patient taking differently: Take 37.5 mg by mouth daily before breakfast. Take 1/2 tablet daily x 1-2 weeks, then increase to full tablet daily), Disp: 30 tablet, Rfl: 2   Review of Systems:   ROS Negative unless otherwise specified per HPI.  Vitals:   Vitals:   01/11/23 0800  BP: 120/80  Pulse: 100  Temp: (!) 97.2 F (36.2 C)  TempSrc: Temporal  Weight: 194 lb 8 oz (88.2 kg)  Height: 5' 5 (1.651 m)     Body mass index is 32.37 kg/m.  Physical Exam:   Physical Exam Vitals and nursing note reviewed.  Constitutional:      General: She is not in acute distress.    Appearance: She is well-developed. She is not ill-appearing or toxic-appearing.  HENT:     Head: Normocephalic and atraumatic.     Right Ear: Tympanic membrane, ear canal and external ear normal. Tympanic membrane is not erythematous, retracted or bulging.     Left Ear: Tympanic membrane, ear canal and external ear normal. Tympanic membrane is not erythematous, retracted or bulging.     Nose:     Right Sinus: Frontal sinus tenderness present. No maxillary sinus tenderness.     Left Sinus: Frontal sinus tenderness present. No maxillary sinus tenderness.     Mouth/Throat:     Pharynx: Uvula midline. No posterior oropharyngeal erythema.  Eyes:     General: Lids are normal.     Conjunctiva/sclera: Conjunctivae  normal.  Neck:     Trachea: Trachea normal.  Cardiovascular:     Rate and Rhythm: Normal rate and regular rhythm.     Heart sounds: Normal heart sounds, S1 normal and S2 normal.  Pulmonary:     Effort: Pulmonary effort is normal.     Breath sounds: Normal breath sounds. No decreased breath sounds, wheezing, rhonchi or rales.  Lymphadenopathy:     Cervical: No cervical adenopathy.  Skin:  General: Skin is warm and dry.  Neurological:     Mental Status: She is alert.  Psychiatric:        Speech: Speech normal.        Behavior: Behavior normal. Behavior is cooperative.     Assessment and Plan:   Obesity, unspecified class, unspecified obesity type, unspecified whether serious comorbidity present Ongoing Had some initial improvement in weight with phentermine  without any negative side effect(s) -- will trial Vyvanse  for binge eating disorder and to help with ADHD symptom(s) -- if this is not covered, will send in phentermine  instead Follow-up in 3 months, sooner if concerns  Acute cough No red flags on exam.   Will initiate augmentin  per orders.  Discussed taking medications as prescribed.  Reviewed return precautions including new or worsening fever, SOB, new or worsening cough or other concerns.  Push fluids and rest.  I recommend that patient follow-up if symptoms worsen or persist despite treatment x 7-10 days, sooner if needed.   Lucie Buttner, PA-C

## 2023-01-14 ENCOUNTER — Other Ambulatory Visit: Payer: Self-pay | Admitting: Physician Assistant

## 2023-01-17 ENCOUNTER — Telehealth: Payer: Self-pay

## 2023-01-17 ENCOUNTER — Other Ambulatory Visit (HOSPITAL_COMMUNITY): Payer: Self-pay

## 2023-01-17 ENCOUNTER — Ambulatory Visit: Payer: 59 | Admitting: Physician Assistant

## 2023-01-17 NOTE — Telephone Encounter (Signed)
 Pharmacy Patient Advocate Encounter   Received notification from CoverMyMeds that prior authorization for Phentermine  HCl 37.5MG  tablets is required/requested.   Insurance verification completed.   The patient is insured through [facility] .   Per test claim: PA required; PA submitted to above mentioned insurance via CoverMyMeds Key/confirmation #/EOC [id number] Status is pending

## 2023-01-18 NOTE — Telephone Encounter (Signed)
 Pharmacy Patient Advocate Encounter  Received notification from [facility] that Prior Authorization for Phentermine  37.5 mg tablets has been DENIED.  See denial reason below. No denial letter attached in CMM. Will attach denial letter to Media tab once received.   PA #/Case ID/Reference #: [id number]

## 2023-01-22 NOTE — Telephone Encounter (Signed)
 Left message on voicemail to call office.

## 2023-01-23 NOTE — Telephone Encounter (Signed)
 Pt called back told her PA for Phentermine  has been denied by your insurance, due to not a covered benefit with your plan. Pt verbalized understanding. Told pt if she wants she can pay out of pocket I do not think it is very expensive. Pt verbalized understanding and said she will look into paying out of pocket.

## 2023-02-01 ENCOUNTER — Other Ambulatory Visit: Payer: Self-pay | Admitting: Physician Assistant

## 2023-02-01 DIAGNOSIS — M3219 Other organ or system involvement in systemic lupus erythematosus: Secondary | ICD-10-CM

## 2023-02-01 DIAGNOSIS — M359 Systemic involvement of connective tissue, unspecified: Secondary | ICD-10-CM

## 2023-02-01 NOTE — Telephone Encounter (Signed)
Last Fill: 10/23/2022  Eye exam: 05/29/2022 WNL   Labs: 11/27/2022 RBC count is borderline elevated. Rest of CBC WNL CMP WNL   Next Visit: 04/30/2023  Last Visit: 11/27/2022  DX:Other systemic lupus erythematosus with other organ involvement   Current Dose per office note 11/27/2022: Plaquenil 200 mg 1 tablet by mouth twice daily M-F.   Okay to refill Plaquenil?

## 2023-02-18 ENCOUNTER — Other Ambulatory Visit: Payer: Self-pay | Admitting: Physician Assistant

## 2023-02-18 NOTE — Telephone Encounter (Signed)
 Pt requesting Vyvanse  30 mg capsule. Last OV 01/11/2023.

## 2023-04-03 ENCOUNTER — Other Ambulatory Visit: Payer: Self-pay | Admitting: Physician Assistant

## 2023-04-03 NOTE — Telephone Encounter (Signed)
 Pt requesting refill for Vyvanse 30 mg capsule. Last OV 01/11/2023, pt scheduled 04/11/2023.

## 2023-04-11 ENCOUNTER — Ambulatory Visit: Payer: 59 | Admitting: Physician Assistant

## 2023-04-11 ENCOUNTER — Encounter: Payer: Self-pay | Admitting: Physician Assistant

## 2023-04-11 VITALS — BP 102/70 | HR 92 | Temp 97.4°F | Ht 65.0 in | Wt 179.4 lb

## 2023-04-11 DIAGNOSIS — R41840 Attention and concentration deficit: Secondary | ICD-10-CM

## 2023-04-11 DIAGNOSIS — E669 Obesity, unspecified: Secondary | ICD-10-CM

## 2023-04-11 MED ORDER — LISDEXAMFETAMINE DIMESYLATE 40 MG PO CAPS: 40.0000 mg | ORAL_CAPSULE | ORAL | 0 refills | Status: DC

## 2023-04-11 NOTE — Progress Notes (Signed)
 Emma Franco is a 43 y.o. female here for a follow up of a pre-existing problem.  History of Present Illness:   Chief Complaint  Patient presents with   Obesity    Pt currently taking Vyvanse 30 mg, has helped with her focus, but feels it wears off at lunch time, has curbed appetite.    HPI  Obesity  Patient continues to loss weight. She states that she typically goes on daily walks. She is currently on Vyvanse 30 mg. Tolerates this well but states that her focus it usually wears off around noon.  She is requesting a dose increase at this time.  Patient also reports compliance and good tolerance of Wellbutrin Sr 200 mg and Lexapro 20 mg daily.   Past Medical History:  Diagnosis Date   Allergy    Anxiety    Arthritis    "in my back"   Asthma    Chronic sinusitis 05/21/2017   s/p surgery, also complicated by CSF leak, which was repaired    Environmental allergies    Esophageal reflux    ESOPHAGITIS 03/18/2008   Qualifier: Diagnosis of  By: Koleen Distance CMA (AAMA), Hulan Saas     Family history of rheumatoid arthritis 05/21/2017   Mother   Headache(784.0) 07/18/2011   "qd for the last year; CSF leak; repaired today"   Hiatal hernia    History of miscarriage 06/26/2017   In first trimester   Irritable bowel syndrome    Lupus    Migraines    "once in a blue moon"   Scoliosis    mild   Sinusitis, chronic      Social History   Tobacco Use   Smoking status: Never    Passive exposure: Past   Smokeless tobacco: Never  Vaping Use   Vaping status: Never Used  Substance Use Topics   Alcohol use: Yes    Comment: social   Drug use: No    Past Surgical History:  Procedure Laterality Date   CESAREAN SECTION  03/2009   CESAREAN SECTION Bilateral 10/16/2013   Procedure: REPEAT CESAREAN SECTION WITH BILATERAL TUBAL LIGATION ;  Surgeon: Tresa Endo A. Ernestina Penna, MD;  Location: [facility];  Service: Obstetrics;  Laterality: Bilateral;   EDD: 10/26/13   CHOLECYSTECTOMY  12/1996   NASAL  SEPTUM SURGERY  2006   NASAL SINUS SURGERY  03/2010; 07/18/11   REPAIR DURAL / CSF LEAK  07/18/2011   SINUS ENDO W/FUSION  07/18/2011   Procedure: ENDOSCOPIC SINUS SURGERY WITH FUSION NAVIGATION;  Surgeon: Melvenia Beam, MD;  Location: [facility];  Service: ENT;  Laterality: N/A;   TONSILLECTOMY  1990   TUBAL LIGATION      Family History  Problem Relation Age of Onset   Rheum arthritis Mother    Multiple sclerosis Mother    Breast cancer Mother    Arthritis Mother    Stroke Maternal Grandmother    Stroke Paternal Grandmother     Allergies  Allergen Reactions   Cefdinir Swelling   Ciprofloxacin Nausea And Vomiting   Ciprofibrate Nausea And Vomiting    Current Medications:   Current Outpatient Medications:    buPROPion (WELLBUTRIN SR) 200 MG 12 hr tablet, Take 1 tablet (200 mg total) by mouth 2 (two) times daily., Disp: 60 tablet, Rfl: 5   cetirizine (ZYRTEC) 10 MG tablet, Take 10 mg by mouth daily., Disp: , Rfl:    Cholecalciferol (VITAMIN D) 50 MCG (2000 UT) CAPS, Take 1 capsule by mouth daily in the afternoon.,  Disp: , Rfl:    cycloSPORINE (RESTASIS) 0.05 % ophthalmic emulsion, 1 drop 2 (two) times daily., Disp: , Rfl:    escitalopram (LEXAPRO) 20 MG tablet, Take 1 tablet (20 mg total) by mouth daily., Disp: 30 tablet, Rfl: 5   fluticasone (FLONASE) 50 MCG/ACT nasal spray, Place 1 spray into both nostrils as needed., Disp: 18.2 mL, Rfl: 11   hydroxychloroquine (PLAQUENIL) 200 MG tablet, TAKE 1 TABLET TWICE DAILY MONDAY THROUGH FRIDAY., Disp: 40 tablet, Rfl: 2   lisdexamfetamine (VYVANSE) 30 MG capsule, Take 1 capsule (30 mg total) by mouth daily., Disp: 30 capsule, Rfl: 0   meclizine (ANTIVERT) 25 MG tablet, Take 1 tablet (25 mg total) by mouth 3 (three) times daily as needed for dizziness., Disp: 30 tablet, Rfl: 0   Review of Systems:   ROS Negative unless otherwise specified per HPI.  Vitals:   Vitals:   04/11/23 0822  BP: 102/70  Pulse: 92  Temp: (!) 97.4 F (36.3 C)   TempSrc: Temporal  SpO2: 99%  Weight: 179 lb 6.1 oz (81.4 kg)  Height: 5\' 5"  (1.651 m)     Body mass index is 29.85 kg/m.  Physical Exam:   Physical Exam Constitutional:      Appearance: Normal appearance. She is well-developed.  HENT:     Head: Normocephalic and atraumatic.  Eyes:     General: Lids are normal.     Extraocular Movements: Extraocular movements intact.     Conjunctiva/sclera: Conjunctivae normal.  Pulmonary:     Effort: Pulmonary effort is normal.  Musculoskeletal:        General: Normal range of motion.     Cervical back: Normal range of motion and neck supple.  Skin:    General: Skin is warm and dry.  Neurological:     Mental Status: She is alert and oriented to person, place, and time.  Psychiatric:        Attention and Perception: Attention and perception normal.        Mood and Affect: Mood normal.        Behavior: Behavior normal.        Thought Content: Thought content normal.        Judgment: Judgment normal.     Assessment and Plan:   Obesity, unspecified class, unspecified obesity type, unspecified whether serious comorbidity present; Attention or concentration deficit Improving Increase Vyvanse to 40 mg daily I have asked her to message me after a few weeks to assess if she would like to increase this for the next month Follow up in 3 month(s), sooner if concerns Prescription drug monitoring program reviewed - no red flags    Jarold Motto, PA-C  Ladona Mow M Kadhim,acting as a scribe for Jarold Motto, PA.,have documented all relevant documentation on the behalf of Jarold Motto, PA,as directed by  Jarold Motto, PA while in the presence of Jarold Motto, [place].   I, Jarold Motto, [place], have reviewed all documentation for this visit. The documentation on 04/11/23 for the exam, diagnosis, procedures, and orders are all accurate and complete.

## 2023-04-16 NOTE — Progress Notes (Unsigned)
 Office Visit Note  Patient: Emma Franco             Date of Birth: 1980-05-27           MRN: [id number]             PCP: Alexander Iba, PA Referring: Alexander Iba, PA Visit Date: 04/30/2023 Occupation: @GUAROCC @  Subjective:  Medication monitoring  History of Present Illness: Emma Franco is a 43 y.o. female with history of systemic lupus.  Patient remains on  Plaquenil  200 mg 1 tablet by mouth twice daily M-F.  She is tolerating Plaquenil  without any side effects and has not had any recent gaps in therapy.  She denies any signs or symptoms of a systemic lupus flare.  She denies any increased joint pain or joint swelling at this time.  She continues to have chronic fatigue and ongoing hair thinning but denies any other new or worsening symptoms.  She has not had any oral or nasal ulcerations.  She continues to have chronic sicca symptoms.  She denies any recent rashes but has ongoing photosensitivity.  She denies any new medical conditions.  She denies any recent or recurrent infections.    Activities of Daily Living:  Patient reports morning stiffness for 30 minutes.   Patient Denies nocturnal pain.  Difficulty dressing/grooming: Denies Difficulty climbing stairs: Denies Difficulty getting out of chair: Denies Difficulty using hands for taps, buttons, cutlery, and/or writing: Denies  Review of Systems  Constitutional:  Positive for fatigue.  HENT:  Positive for mouth dryness. Negative for mouth sores and nose dryness.   Eyes:  Positive for dryness. Negative for pain.  Respiratory:  Positive for shortness of breath. Negative for difficulty breathing.   Cardiovascular:  Negative for chest pain and palpitations.  Gastrointestinal:  Positive for constipation. Negative for blood in stool and diarrhea.  Endocrine: Negative for increased urination.  Genitourinary:  Negative for involuntary urination.  Musculoskeletal:  Positive for myalgias, muscle weakness,  morning stiffness, muscle tenderness and myalgias. Negative for joint pain, gait problem, joint pain and joint swelling.  Skin:  Positive for hair loss and sensitivity to sunlight. Negative for color change and rash.  Allergic/Immunologic: Negative for susceptible to infections.  Neurological:  Positive for dizziness. Negative for headaches.  Hematological:  Negative for swollen glands.  Psychiatric/Behavioral:  Negative for depressed mood and sleep disturbance. The patient is nervous/anxious.     PMFS History:  Patient Active Problem List   Diagnosis Date Noted   Cyst of ovary 05/18/2020   Cerebrospinal fluid leak 05/18/2020   Anxiety 05/18/2020   Recurrent major depressive disorder, in full remission (HCC) 11/19/2019   Other forms of systemic lupus erythematosus (HCC) 03/05/2019   Autoimmune disease (HCC) 06/26/2017   Scoliosis 05/21/2017   Gastroesophageal reflux disease 03/18/2008   Diaphragmatic hernia 03/18/2008   Irritable bowel syndrome 02/06/2008   Arthropathy 02/06/2008    Past Medical History:  Diagnosis Date   Allergy     Anxiety    Arthritis    "in my back"   Asthma    Chronic sinusitis 05/21/2017   s/p surgery, also complicated by CSF leak, which was repaired    Environmental allergies    Esophageal reflux    ESOPHAGITIS 03/18/2008   Qualifier: Diagnosis of  By: Celestia Colander CMA (AAMA), Christine Cozier     Family history of rheumatoid arthritis 05/21/2017   Mother   Headache(784.0) 07/18/2011   "qd for the last year; CSF leak; repaired today"  Hiatal hernia    History of miscarriage 06/26/2017   In first trimester   Irritable bowel syndrome    Lupus    Migraines    "once in a blue moon"   Scoliosis    mild   Sinusitis, chronic     Family History  Problem Relation Age of Onset   Rheum arthritis Mother    Multiple sclerosis Mother    Breast cancer Mother    Arthritis Mother    Stroke Maternal Grandmother    Stroke Paternal Grandmother    Past Surgical History:   Procedure Laterality Date   CESAREAN SECTION  03/2009   CESAREAN SECTION Bilateral 10/16/2013   Procedure: REPEAT CESAREAN SECTION WITH BILATERAL TUBAL LIGATION ;  Surgeon: Loetta Ringer A. Johnston Nao, MD;  Location: [facility];  Service: Obstetrics;  Laterality: Bilateral;   EDD: 10/26/13   CHOLECYSTECTOMY  12/1996   NASAL SEPTUM SURGERY  2006   NASAL SINUS SURGERY  03/2010; 07/18/11   REPAIR DURAL / CSF LEAK  07/18/2011   SINUS ENDO W/FUSION  07/18/2011   Procedure: ENDOSCOPIC SINUS SURGERY WITH FUSION NAVIGATION;  Surgeon: Littie Rife, MD;  Location: [facility];  Service: ENT;  Laterality: N/A;   TONSILLECTOMY  1990   TUBAL LIGATION     Social History   Social History Narrative   Lives with husband Bambi Lever (lineman for [employer])   2 girls: Rylee 13 and Stella 9    Both daughters are very involved in sports   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      Dog: Gpys- Double Doodle   Immunization History  Administered Date(s) Administered   Influenza Split 10/01/2008, 10/08/2012   Influenza, Seasonal, Injecte, Preservative Fre 11/06/2011, 10/12/2013, 10/09/2014, 09/11/2022   Influenza,inj,Quad PF,6+ Mos 10/18/2019   Influenza-Unspecified 09/30/2017, 09/17/2018, 10/20/2021, 10/30/2021   Meningococcal polysaccharide vaccine (MPSV4) 07/19/2011   PFIZER(Purple Top)SARS-COV-2 Vaccination 09/03/2019, 09/24/2019   Pneumococcal Conjugate-13 11/11/2017   Pneumococcal Polysaccharide-23 07/19/2011   Tdap 04/27/2010, 08/18/2013     Objective: Vital Signs: BP 115/81 (BP Location: Left Arm, Patient Position: Sitting, Cuff Size: Normal)   Pulse 94   Ht 5\' 5"  (1.651 m)   Wt 179 lb 3.2 oz (81.3 kg)   LMP 03/14/2023 (Approximate)   BMI 29.82 kg/m    Physical Exam Vitals and nursing note reviewed.  Constitutional:      Appearance: She is well-developed.  HENT:     Head: Normocephalic and atraumatic.  Eyes:     Conjunctiva/sclera: Conjunctivae normal.  Cardiovascular:     Rate and Rhythm: Normal rate and  regular rhythm.     Heart sounds: Normal heart sounds.  Pulmonary:     Effort: Pulmonary effort is normal.     Breath sounds: Normal breath sounds.  Abdominal:     General: Bowel sounds are normal.     Palpations: Abdomen is soft.  Musculoskeletal:     Cervical back: Normal range of motion.  Lymphadenopathy:     Cervical: No cervical adenopathy.  Skin:    General: Skin is warm and dry.     Capillary Refill: Capillary refill takes less than 2 seconds.  Neurological:     Mental Status: She is alert and oriented to person, place, and time.  Psychiatric:        Behavior: Behavior normal.      Musculoskeletal Exam: C-spine has limited ROM with lateral rotation. Thoracolumbar scoliosis.  Shoulder joints, elbow joints, wrist joints, MCPs, PIPs, and DIPs good ROM with no synovitis.  Hip joints have good ROM with no groin pain.  Knee joints have good ROM with no warmth or effusion.  Ankle joints have good ROM with no tenderness or joint swelling.    CDAI Exam: CDAI Score: -- Patient Global: --; Provider Global: -- Swollen: --; Tender: -- Joint Exam 04/30/2023   No joint exam has been documented for this visit   There is currently no information documented on the homunculus. Go to the Rheumatology activity and complete the homunculus joint exam.  Investigation: No additional findings.  Imaging: No results found.  Recent Labs: Lab Results  Component Value Date   WBC 4.9 11/27/2022   HGB 14.5 11/27/2022   PLT 274 11/27/2022   NA 138 11/27/2022   K 4.1 11/27/2022   CL 102 11/27/2022   CO2 29 11/27/2022   GLUCOSE 90 11/27/2022   BUN 6 (L) 11/27/2022   CREATININE 0.88 11/27/2022   BILITOT 0.4 11/27/2022   ALKPHOS 131 (H) 09/11/2013   AST 13 11/27/2022   ALT 9 11/27/2022   PROT 6.8 11/27/2022   ALBUMIN 2.7 (L) 09/11/2013   CALCIUM  9.2 11/27/2022   GFRAA 103 07/07/2020   QFTBGOLDPLUS NEGATIVE 03/24/2019    Speciality Comments: PLQ Eye Exam:05/29/2022 WNL Dr. Frosty Jews f/u 12 months   Procedures:  No procedures performed Allergies: Cefdinir , Ciprofloxacin, and Ciprofibrate   Assessment / Plan:     Visit Diagnoses: Other systemic lupus erythematosus with other organ involvement (HCC) - ANA positive, dsDNA 48, rest of the ENA negative, C3-C4 normal.  History of fatigue, hair loss, arthralgias, oral ulcers, sicca symptoms and photosensitivity: She has not had any signs or symptoms of a systemic lupus flare.  She has overall been clinically doing well taking Plaquenil  200 mg 1 tablet by mouth twice daily Monday through Friday.  She continues to tolerate Plaquenil  without any side effects and has not had any recent gaps in therapy.  Her primary concerns remain fatigue, chronic hair thinning, and photosensitivity.  Overall her arthralgias and joint inflammation are well-controlled taking Plaquenil  as prescribed.  She has not had any oral or nasal ulcerations.  She continues to have chronic sicca symptoms.  No recent rashes.  She was out in the sun this past and did notice some increased shortness of breath which is since resolved.  She is not experiencing any other new or worsening symptoms.   Lab work from 11/27/2022 was reviewed today in the office: ESR within normal limits, complements within normal limits, double-stranded DNA 12-stable. Plan to update the following lab work today for further evaluation.  No new medical conditions.  No recent or recurrent infections.  She will remain on Plaquenil  as prescribed.  She was advised to notify us  if she develops signs or symptoms of a flare.  She will follow-up in the office in 5 months or sooner if needed.  - Plan: Protein / creatinine ratio, urine, CBC with Differential/Platelet, Comprehensive metabolic panel with GFR, C3 and C4, Sedimentation rate, Anti-DNA antibody, double-stranded, ANA, hydroxychloroquine  (PLAQUENIL ) 200 MG tablet  High risk medication use - Plaquenil  200 mg 1 tablet by mouth twice daily M-F. PLQ  Eye Exam:05/29/2022 WNL Dr. Frosty Jews f/u 12 months.  Patient was advised to schedule an updated Plaquenil  examination.  She was given eye examination form to take with her to her next appointment. CBC and CMP updated on 11/27/22. Orders for CBC and CMP released today.    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  Discussed the importance of avoiding direct sun exposure and to wear sunscreen on a daily basis.  Other fatigue: Patient continues to experience chronic fatigue on a daily basis which has been stable overall.  Plan to obtain the following lab work today for further evaluation.  Trapezius muscle spasm -She continues to experience intermittent trapezius muscle tension and tenderness bilaterally.  She has limited range of motion of the cervical spine with lateral rotation.  A refill of methocarbamol  500 mg 1 tablet daily as needed was sent to the pharmacy today.    Other form of scoliosis of lumbar spine:Chronic pain and stiffness.  Intermittent nocturnal pain. A refill of methocarbamol  500 mg 1 tablet daily as needed for muscle spasms was sent to the pharmacy today.  Vitamin D  deficiency -Order for vitamin D  released today.   Plan: VITAMIN D  25 Hydroxy (Vit-D Deficiency, Fractures)  Other medical conditions are listed as follows:   History of IBS  History of gastroesophageal reflux (GERD)  Hiatal hernia  History of anxiety  Sicca complex (HCC)   Orders: Orders Placed This Encounter  Procedures   Protein / creatinine ratio, urine   CBC with Differential/Platelet   Comprehensive metabolic panel with GFR   C3 and C4   Sedimentation rate   Anti-DNA antibody, double-stranded   ANA   VITAMIN D  25 Hydroxy (Vit-D Deficiency, Fractures)   Meds ordered this encounter  Medications   hydroxychloroquine  (PLAQUENIL ) 200 MG tablet    Sig: TAKE 1 TABLET TWICE DAILY MONDAY THROUGH FRIDAY.    Dispense:  40 tablet     Refill:  2   methocarbamol  (ROBAXIN ) 500 MG tablet    Sig: Take 1 tablet (500 mg total) by mouth daily as needed.    Dispense:  30 tablet    Refill:  1     Follow-Up Instructions: Return in about 5 months (around 09/30/2023) for Systemic lupus erythematosus.   Romayne Clubs, PA-C  Note - This record has been created using Dragon software.  Chart creation errors have been sought, but may not always  have been located. Such creation errors do not reflect on  the standard of medical care.

## 2023-04-30 ENCOUNTER — Encounter: Payer: Self-pay | Admitting: Physician Assistant

## 2023-04-30 ENCOUNTER — Ambulatory Visit: Payer: 59 | Attending: Physician Assistant | Admitting: Physician Assistant

## 2023-04-30 VITALS — BP 115/81 | HR 94 | Ht 65.0 in | Wt 179.2 lb

## 2023-04-30 DIAGNOSIS — E559 Vitamin D deficiency, unspecified: Secondary | ICD-10-CM

## 2023-04-30 DIAGNOSIS — Z79899 Other long term (current) drug therapy: Secondary | ICD-10-CM | POA: Diagnosis not present

## 2023-04-30 DIAGNOSIS — Z8719 Personal history of other diseases of the digestive system: Secondary | ICD-10-CM

## 2023-04-30 DIAGNOSIS — M62838 Other muscle spasm: Secondary | ICD-10-CM

## 2023-04-30 DIAGNOSIS — L930 Discoid lupus erythematosus: Secondary | ICD-10-CM

## 2023-04-30 DIAGNOSIS — M359 Systemic involvement of connective tissue, unspecified: Secondary | ICD-10-CM

## 2023-04-30 DIAGNOSIS — M4186 Other forms of scoliosis, lumbar region: Secondary | ICD-10-CM | POA: Diagnosis not present

## 2023-04-30 DIAGNOSIS — M3219 Other organ or system involvement in systemic lupus erythematosus: Secondary | ICD-10-CM

## 2023-04-30 DIAGNOSIS — M3500 Sicca syndrome, unspecified: Secondary | ICD-10-CM

## 2023-04-30 DIAGNOSIS — K449 Diaphragmatic hernia without obstruction or gangrene: Secondary | ICD-10-CM

## 2023-04-30 DIAGNOSIS — R5383 Other fatigue: Secondary | ICD-10-CM

## 2023-04-30 DIAGNOSIS — Z8659 Personal history of other mental and behavioral disorders: Secondary | ICD-10-CM

## 2023-04-30 MED ORDER — HYDROXYCHLOROQUINE SULFATE 200 MG PO TABS
ORAL_TABLET | ORAL | 2 refills | Status: DC
Start: 2023-04-30 — End: 2023-09-17

## 2023-04-30 MED ORDER — METHOCARBAMOL 500 MG PO TABS: 500.0000 mg | ORAL_TABLET | Freq: Every day | ORAL | 1 refills | Status: DC | PRN

## 2023-04-30 NOTE — Progress Notes (Signed)
 CBC within normal limits

## 2023-05-01 NOTE — Progress Notes (Signed)
 No proteinuria.  Vitamin D  WNL ESR WNL CMP stable.

## 2023-05-01 NOTE — Progress Notes (Signed)
Complements WNL

## 2023-05-02 LAB — CBC WITH DIFFERENTIAL/PLATELET
Absolute Lymphocytes: 1433 {cells}/uL (ref 850–3900)
Absolute Monocytes: 563 {cells}/uL (ref 200–950)
Basophils Absolute: 52 {cells}/uL (ref 0–200)
Basophils Relative: 0.9 %
Eosinophils Absolute: 406 {cells}/uL (ref 15–500)
Eosinophils Relative: 7 %
HCT: 41.3 % (ref 35.0–45.0)
Hemoglobin: 13.6 g/dL (ref 11.7–15.5)
MCH: 28.8 pg (ref 27.0–33.0)
MCHC: 32.9 g/dL (ref 32.0–36.0)
MCV: 87.3 fL (ref 80.0–100.0)
MPV: 10.7 fL (ref 7.5–12.5)
Monocytes Relative: 9.7 %
Neutro Abs: 3347 {cells}/uL (ref 1500–7800)
Neutrophils Relative %: 57.7 %
Platelets: 303 10*3/uL (ref 140–400)
RBC: 4.73 10*6/uL (ref 3.80–5.10)
RDW: 12.4 % (ref 11.0–15.0)
Total Lymphocyte: 24.7 %
WBC: 5.8 10*3/uL (ref 3.8–10.8)

## 2023-05-02 LAB — COMPREHENSIVE METABOLIC PANEL WITH GFR
AG Ratio: 1.7 (calc) (ref 1.0–2.5)
ALT: 10 U/L (ref 6–29)
AST: 13 U/L (ref 10–30)
Albumin: 4 g/dL (ref 3.6–5.1)
Alkaline phosphatase (APISO): 59 U/L (ref 31–125)
BUN/Creatinine Ratio: 7 (calc) (ref 6–22)
BUN: 5 mg/dL — ABNORMAL LOW (ref 7–25)
CO2: 27 mmol/L (ref 20–32)
Calcium: 9.1 mg/dL (ref 8.6–10.2)
Chloride: 103 mmol/L (ref 98–110)
Creat: 0.75 mg/dL (ref 0.50–0.99)
Globulin: 2.3 g/dL (ref 1.9–3.7)
Glucose, Bld: 91 mg/dL (ref 65–99)
Potassium: 3.9 mmol/L (ref 3.5–5.3)
Sodium: 139 mmol/L (ref 135–146)
Total Bilirubin: 0.4 mg/dL (ref 0.2–1.2)
Total Protein: 6.3 g/dL (ref 6.1–8.1)
eGFR: 101 mL/min/{1.73_m2} (ref >=60)

## 2023-05-02 LAB — PROTEIN / CREATININE RATIO, URINE
Creatinine, Urine: 289 mg/dL — ABNORMAL HIGH (ref 20–275)
Protein/Creat Ratio: 55 mg/g{creat} (ref 24–184)
Protein/Creatinine Ratio: 0.055 mg/mg{creat} (ref 0.024–0.184)
Total Protein, Urine: 16 mg/dL (ref 5–24)

## 2023-05-02 LAB — ANTI-NUCLEAR AB-TITER (ANA TITER): ANA Titer 1: 1:40 {titer} — ABNORMAL HIGH

## 2023-05-02 LAB — VITAMIN D 25 HYDROXY (VIT D DEFICIENCY, FRACTURES): Vit D, 25-Hydroxy: 52 ng/mL (ref 30–100)

## 2023-05-02 LAB — ANTI-DNA ANTIBODY, DOUBLE-STRANDED: ds DNA Ab: 9 [IU]/mL — ABNORMAL HIGH

## 2023-05-02 LAB — C3 AND C4
C3 Complement: 101 mg/dL (ref 83–193)
C4 Complement: 27 mg/dL (ref 15–57)

## 2023-05-02 LAB — ANA: Anti Nuclear Antibody (ANA): POSITIVE — AB

## 2023-05-02 LAB — SEDIMENTATION RATE: Sed Rate: 2 mm/h (ref 0–20)

## 2023-05-02 NOTE — Progress Notes (Signed)
 dsDNA is within the indeterminate range--improved.   Labs are not consistent with a flare.  No change in therapy recommended at this time

## 2023-05-03 NOTE — Progress Notes (Signed)
 ANA remains positive-low titer.

## 2023-05-16 ENCOUNTER — Other Ambulatory Visit: Payer: Self-pay | Admitting: Physician Assistant

## 2023-05-16 NOTE — Telephone Encounter (Signed)
 Last OV: 04/11/23  Next OV: 07/17/23  Last Filled: 04/11/23  Quantity: 30

## 2023-06-24 ENCOUNTER — Other Ambulatory Visit: Payer: Self-pay | Admitting: Physician Assistant

## 2023-06-24 NOTE — Telephone Encounter (Signed)
 Pt requesting refill for Vyvanse  40 mg. Pt scheduled for 07/17/2023. Last OV 04/11/2023.

## 2023-06-25 LAB — HM MAMMOGRAPHY

## 2023-07-17 ENCOUNTER — Encounter: Payer: Self-pay | Admitting: Physician Assistant

## 2023-07-17 ENCOUNTER — Ambulatory Visit (INDEPENDENT_AMBULATORY_CARE_PROVIDER_SITE_OTHER): Admitting: Physician Assistant

## 2023-07-17 VITALS — BP 120/84 | HR 64 | Temp 98.2°F | Ht 65.0 in | Wt 177.2 lb

## 2023-07-17 DIAGNOSIS — Z1159 Encounter for screening for other viral diseases: Secondary | ICD-10-CM

## 2023-07-17 DIAGNOSIS — Z13220 Encounter for screening for lipoid disorders: Secondary | ICD-10-CM

## 2023-07-17 DIAGNOSIS — Z23 Encounter for immunization: Secondary | ICD-10-CM | POA: Diagnosis not present

## 2023-07-17 DIAGNOSIS — Z136 Encounter for screening for cardiovascular disorders: Secondary | ICD-10-CM

## 2023-07-17 DIAGNOSIS — Z0000 Encounter for general adult medical examination without abnormal findings: Secondary | ICD-10-CM | POA: Diagnosis not present

## 2023-07-17 DIAGNOSIS — Z114 Encounter for screening for human immunodeficiency virus [HIV]: Secondary | ICD-10-CM | POA: Diagnosis not present

## 2023-07-17 DIAGNOSIS — R41840 Attention and concentration deficit: Secondary | ICD-10-CM

## 2023-07-17 DIAGNOSIS — M328 Other forms of systemic lupus erythematosus: Secondary | ICD-10-CM

## 2023-07-17 LAB — LIPID PANEL
Cholesterol: 189 mg/dL (ref 0–200)
HDL: 76.3 mg/dL (ref >39.00)
LDL Cholesterol: 98 mg/dL (ref 0–99)
NonHDL: 112.74
Total CHOL/HDL Ratio: 2
Triglycerides: 72 mg/dL (ref 0.0–149.0)
VLDL: 14.4 mg/dL (ref 0.0–40.0)

## 2023-07-17 NOTE — Patient Instructions (Addendum)
 It was great to see you!  Please go to the lab for blood work.   Our office will call you with your results unless you have chosen to receive results via MyChart.  If your blood work is normal we will follow-up each year for physicals and as scheduled for chronic medical problems.  If anything is abnormal we will treat accordingly and get you in for a follow-up.  Take care,  Lelon Mast

## 2023-07-17 NOTE — Progress Notes (Signed)
 Subjective:    Emma Franco is a 43 y.o. female and is here for a comprehensive physical exam.  HPI  Health Maintenance Due  Topic Date Due   HIV Screening  Never done   Hepatitis C Screening  Never done   MAMMOGRAM  06/14/2023    Acute Concerns: No acute concerns reported today.  Chronic Issues: ADHD Pt is on Vyvanse  40 mg daily. Good compliance and tolerance. She reports she eating less and increased focus and motivation She did take Phentermine  in the past, but reports the effect lessened over time. Pt states it is only during her menstrual period that she eats more. No acute concerns reported today.  Anxiety / Depression  Pt is on Wellbutrin  SR 200 mg BID and Lexapro  20 mg daily. Good compliance and tolerance. Depression is well-managed, but pt reports she still experiences anxiety about certain things. Pt states she does not want to adjust her medications at this time.  SLE She is well controlled at this time She is currently compliant with medications and specialist follow up She is overdue for yearly eye exam and plans to schedule this soon  Health Maintenance: Immunizations -- See above. Colonoscopy -- N/a Mammogram -- Last done June 2025 -- requesting records PAP -- UTD, 06/26/2022 NILM. 5 year repeat recommended, next due in 06/2027. Bone Density -- N/a Diet -- Moderately healthy diet. Exercise -- Walks with her friend three days a week.  Sleep habits -- Good sleeping habits. Mood -- Stable.  UTD with dentist? - UTD UTD with eye doctor? - No, but intends to make an appt soon UTD with dermatology? - Usually gets annual screening  Weight history: Wt Readings from Last 10 Encounters:  07/17/23 177 lb 4 oz (80.4 kg)  04/30/23 179 lb 3.2 oz (81.3 kg)  04/11/23 179 lb 6.1 oz (81.4 kg)  01/11/23 194 lb 8 oz (88.2 kg)  11/27/22 193 lb (87.5 kg)  10/17/22 202 lb 4 oz (91.7 kg)  09/11/22 204 lb 6.1 oz (92.7 kg)  07/04/22 200 lb 3.2 oz (90.8 kg)   02/02/22 199 lb (90.3 kg)  07/31/21 197 lb 12.8 oz (89.7 kg)   Body mass index is 29.5 kg/m. Patient's last menstrual period was 06/26/2023 (approximate).  Alcohol use:  reports current alcohol use.  Tobacco use:  Tobacco Use: Low Risk  (07/17/2023)   Patient History    Smoking Tobacco Use: Never    Smokeless Tobacco Use: Never    Passive Exposure: Past   Eligible for lung cancer screening? no     07/17/2023    8:28 AM  Depression screen PHQ 2/9  Decreased Interest 0  Down, Depressed, Hopeless 1  PHQ - 2 Score 1  Altered sleeping 1  Tired, decreased energy 1  Change in appetite 0  Feeling bad or failure about yourself  0  Trouble concentrating 0  Moving slowly or fidgety/restless 0  Suicidal thoughts 0  PHQ-9 Score 3  Difficult doing work/chores Not difficult at all     Other providers/specialists: Patient Care Team: Job Lukes, [place] as PCP - General (Physician Assistant) Obgyn, Anna as Consulting Physician (Obstetrics and Gynecology) Dolphus Reiter, MD as Consulting Physician (Rheumatology)    PMHx, SurgHx, SocialHx, Medications, and Allergies were reviewed in the Visit Navigator and updated as appropriate.   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   Asthma  Chronic sinusitis 05/21/2017   s/p surgery, also complicated by CSF leak, which was repaired    Environmental allergies    Esophageal reflux    ESOPHAGITIS 03/18/2008   Qualifier: Diagnosis of  By: Genie CMA (AAMA), Chick     Family history of rheumatoid arthritis 05/21/2017   Mother   Headache(784.0) 07/18/2011   qd for the last year; CSF leak; repaired today   Hiatal hernia    History of miscarriage 06/26/2017   In first trimester   Irritable bowel syndrome    Lupus    Migraines    once in a blue moon   Scoliosis    mild   Sinusitis, chronic      Past Surgical History:  Procedure Laterality Date   CESAREAN SECTION   03/2009   CESAREAN SECTION Bilateral 10/16/2013   Procedure: REPEAT CESAREAN SECTION WITH BILATERAL TUBAL LIGATION ;  Surgeon: Burnard A. Kandyce, MD;  Location: [facility];  Service: Obstetrics;  Laterality: Bilateral;   EDD: 10/26/13   CHOLECYSTECTOMY  12/1996   NASAL SEPTUM SURGERY  2006   NASAL SINUS SURGERY  03/2010; 07/18/11   REPAIR DURAL / CSF LEAK  07/18/2011   SINUS ENDO W/FUSION  07/18/2011   Procedure: ENDOSCOPIC SINUS SURGERY WITH FUSION NAVIGATION;  Surgeon: Merilee Kraft, MD;  Location: [facility] OR;  Service: ENT;  Laterality: N/A;   TONSILLECTOMY  1990   TUBAL LIGATION       Family History  Problem Relation Age of Onset   Rheum arthritis Mother    Multiple sclerosis Mother    Breast cancer Mother    Arthritis Mother    Hearing loss Mother    Hearing loss Father    Stroke Maternal Grandmother    Stroke Paternal Grandmother     Social History   Tobacco Use   Smoking status: Never    Passive exposure: Past   Smokeless tobacco: Never  Vaping Use   Vaping status: Never Used  Substance Use Topics   Alcohol use: Yes    Comment: social   Drug use: No    Review of Systems:   Review of Systems  Constitutional:  Negative for chills, fever, malaise/fatigue and weight loss.  HENT:  Negative for hearing loss, sinus pain and sore throat.   Respiratory:  Negative for cough and hemoptysis.   Cardiovascular:  Negative for chest pain, palpitations, leg swelling and PND.  Gastrointestinal:  Negative for abdominal pain, constipation, diarrhea, heartburn, nausea and vomiting.  Genitourinary:  Negative for dysuria, frequency and urgency.  Musculoskeletal:  Negative for back pain, myalgias and neck pain.  Skin:  Negative for itching and rash.  Neurological:  Negative for dizziness, tingling, seizures and headaches.  Endo/Heme/Allergies:  Negative for polydipsia.  Psychiatric/Behavioral:  Negative for depression. The patient is not nervous/anxious.       Objective:   BP 120/84  (BP Location: Left Arm, Patient Position: Sitting, Cuff Size: Large)   Pulse 64   Temp 98.2 F (36.8 C) (Temporal)   Ht 5' 5 (1.651 m)   Wt 177 lb 4 oz (80.4 kg)   LMP 06/26/2023 (Approximate)   SpO2 98%   BMI 29.50 kg/m  Body mass index is 29.5 kg/m.   General Appearance:    Alert, cooperative, no distress, appears stated age  Head:    Normocephalic, without obvious abnormality, atraumatic  Eyes:    PERRL, conjunctiva/corneas clear, EOM's intact, fundi    benign, both eyes  Ears:    Normal TM's and  external ear canals, both ears  Nose:   Nares normal, septum midline, mucosa normal, no drainage    or sinus tenderness  Throat:   Lips, mucosa, and tongue normal; teeth and gums normal  Neck:   Supple, symmetrical, trachea midline, no adenopathy;    thyroid :  no enlargement/tenderness/nodules; no carotid   bruit or JVD  Back:     Symmetric, no curvature, ROM normal, no CVA tenderness  Lungs:     Clear to auscultation bilaterally, respirations unlabored  Chest Wall:    No tenderness or deformity   Heart:    Regular rate and rhythm, S1 and S2 normal, no murmur, rub or gallop  Breast Exam:    Deferred  Abdomen:     Soft, non-tender, bowel sounds active all four quadrants,    no masses, no organomegaly  Genitalia:    Deferred   Extremities:   Extremities normal, atraumatic, no cyanosis or edema  Pulses:   2+ and symmetric all extremities  Skin:   Skin color, texture, turgor normal, no rashes or lesions  Lymph nodes:   Cervical, supraclavicular, and axillary nodes normal  Neurologic:   CNII-XII intact, normal strength, sensation and reflexes    throughout    Assessment/Plan:   Routine physical examination Today patient counseled on age appropriate routine health concerns for screening and prevention, each reviewed and up to date or declined. Immunizations reviewed and up to date or declined. Labs ordered and reviewed. Risk factors for depression reviewed and negative. Hearing  function and visual acuity are intact. ADLs screened and addressed as needed. Functional ability and level of safety reviewed and appropriate. Education, counseling and referrals performed based on assessed risks today. Patient provided with a copy of personalized plan for preventive services.  Encounter for screening for other viral diseases Update hepatitis C screening  Screening for HIV (human immunodeficiency virus) Update HIV screening  Attention or concentration deficit PDMP reviewed during this encounter. Well controlled Continue Vyvanse  40 mg daily Follow up in 3 month(s), sooner if concerns  Encounter for lipid screening for cardiovascular disease Update lipid panel  Need for prophylactic vaccination with combined diphtheria-tetanus-pertussis (DTP) vaccine Completed today  Other forms of systemic lupus erythematosus, unspecified organ involvement status (HCC) Most recent rheumatology note reviewed Continue compliance with providers/medications   I, Lavern Simmers, acting as a Neurosurgeon for [employer], [place]., have documented all relevant documentation on the behalf of Lucie Buttner, [place], as directed by Lucie Buttner, PA while in the presence of Lucie Buttner, [place].  I, Lucie Buttner, [place], have reviewed all documentation for this visit. The documentation on 07/17/23 for the exam, diagnosis, procedures, and orders are all accurate and complete.  Lucie Buttner, PA-C [facility]

## 2023-07-18 ENCOUNTER — Encounter: Payer: Self-pay | Admitting: Obstetrics

## 2023-07-18 LAB — HIV ANTIBODY (ROUTINE TESTING W REFLEX): HIV 1&2 Ab, 4th Generation: NONREACTIVE

## 2023-07-18 LAB — HEPATITIS C ANTIBODY: Hepatitis C Ab: NONREACTIVE

## 2023-07-19 ENCOUNTER — Ambulatory Visit: Payer: Self-pay | Admitting: Physician Assistant

## 2023-07-26 ENCOUNTER — Other Ambulatory Visit: Payer: Self-pay | Admitting: Physician Assistant

## 2023-07-26 NOTE — Telephone Encounter (Signed)
 Pt requesting refill for Vyvanse  40 mg capsule. Last OV 07/17/2023.

## 2023-08-30 ENCOUNTER — Other Ambulatory Visit: Payer: Self-pay | Admitting: Physician Assistant

## 2023-08-30 NOTE — Telephone Encounter (Signed)
 Pt requesting refill for Vyvanse  40 mg capsule. Last OV 07/17/2023.

## 2023-09-17 ENCOUNTER — Other Ambulatory Visit: Payer: Self-pay | Admitting: Physician Assistant

## 2023-09-17 DIAGNOSIS — Z79899 Other long term (current) drug therapy: Secondary | ICD-10-CM

## 2023-09-17 DIAGNOSIS — M359 Systemic involvement of connective tissue, unspecified: Secondary | ICD-10-CM

## 2023-09-17 DIAGNOSIS — M3219 Other organ or system involvement in systemic lupus erythematosus: Secondary | ICD-10-CM

## 2023-09-17 NOTE — Telephone Encounter (Signed)
 Last Fill: 04/30/2023  Eye exam: 05/29/2022 WNL   Labs: 04/30/2023 No proteinuria.  Vitamin D  WNL  ESR WNL  CMP stable.   Next Visit: 10/01/2023  Last Visit: 04/30/2023  DX: Other systemic lupus erythematosus with other organ involvement   Current Dose per office note 04/30/2023: Plaquenil  200 mg 1 tablet by mouth twice daily M-F.   Okay to refill Plaquenil ?   LMOM for patient to update eye exam and labs.

## 2023-09-17 NOTE — Progress Notes (Unsigned)
 Office Visit Note  Patient: Emma Franco             Date of Birth: February 12, 1980           MRN: [id number]             PCP: Job Lukes, PA Referring: Job Lukes, PA Visit Date: 10/01/2023 Occupation: @GUAROCC @  Subjective:  Medication monitoring  History of Present Illness: Emma Franco is a 43 y.o. female with history of systemic lupus and discoid lupus.  Patient remains on Plaquenil  200 mg 1 tablet by mouth twice daily M-F.  She is tolerating Plaquenil  without any side effects and has not had any gaps in therapy.  Patient reports that she is scheduled for Plaquenil  examination on 10/29/2023.  Patient reports that overall her symptoms have been stable.  She denies any signs or symptoms of a flare.  She continues to have chronic fatigue, muscle fatigue, and myalgias.  She denies any increased joint pain or joint swelling.  Denies any oral or nasal ulcerations.  She continues to have chronic sicca symptoms.  She has been using Systane eyedrops for dry eyes.  She denies any recent rashes or increase photosensitivity.  She denies any discoid lesions.   Activities of Daily Living:  Patient reports morning stiffness for 0 minute.   Patient Denies nocturnal pain.  Difficulty dressing/grooming: Denies Difficulty climbing stairs: Denies Difficulty getting out of chair: Denies Difficulty using hands for taps, buttons, cutlery, and/or writing: Denies  Review of Systems  Constitutional:  Positive for fatigue.  HENT:  Positive for mouth dryness. Negative for mouth sores.   Eyes:  Positive for dryness.  Respiratory:  Negative for shortness of breath.   Cardiovascular:  Negative for chest pain and palpitations.  Gastrointestinal:  Positive for constipation and diarrhea. Negative for blood in stool.  Endocrine: Negative for increased urination.  Genitourinary:  Negative for involuntary urination.  Musculoskeletal:  Positive for myalgias, muscle weakness, muscle  tenderness and myalgias. Negative for joint pain, gait problem, joint pain, joint swelling and morning stiffness.  Skin:  Positive for hair loss. Negative for color change, rash and sensitivity to sunlight.  Allergic/Immunologic: Negative for susceptible to infections.  Neurological:  Negative for dizziness and headaches.  Hematological:  Negative for swollen glands.  Psychiatric/Behavioral:  Negative for depressed mood and sleep disturbance. The patient is nervous/anxious.     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   Asthma    Chronic sinusitis 05/21/2017   s/p surgery, also complicated by CSF leak, which was repaired    Environmental allergies    Esophageal reflux    ESOPHAGITIS 03/18/2008   Qualifier: Diagnosis of  By: Genie CMA (AAMA), Chick     Family history of rheumatoid arthritis 05/21/2017   Mother   Headache(784.0) 07/18/2011   qd for the last year; CSF leak; repaired today   Hiatal hernia    History of  miscarriage 06/26/2017   In first trimester   Irritable bowel syndrome    Lupus    Migraines    once in a blue moon   Scoliosis    mild   Sinusitis, chronic     Family History  Problem Relation Age of Onset   Rheum arthritis Mother    Multiple sclerosis Mother    Breast cancer Mother    Arthritis Mother    Hearing loss Mother    Hearing loss Father    Stroke Maternal Grandmother    Stroke Paternal Grandmother    Past  Surgical History:  Procedure Laterality Date   CESAREAN SECTION  03/2009   CESAREAN SECTION Bilateral 10/16/2013   Procedure: REPEAT CESAREAN SECTION WITH BILATERAL TUBAL LIGATION ;  Surgeon: Burnard A. Kandyce, MD;  Location: [facility];  Service: Obstetrics;  Laterality: Bilateral;   EDD: 10/26/13   CHOLECYSTECTOMY  12/1996   NASAL SEPTUM SURGERY  2006   NASAL SINUS SURGERY  03/2010; 07/18/11   REPAIR DURAL / CSF LEAK  07/18/2011   SINUS ENDO W/FUSION  07/18/2011   Procedure: ENDOSCOPIC SINUS SURGERY WITH FUSION NAVIGATION;  Surgeon: Merilee Kraft, MD;  Location: [facility] OR;  Service: ENT;  Laterality: N/A;   TONSILLECTOMY  1990   TUBAL LIGATION     Social History   Social History Narrative   Lives with husband Ozell (lineman for [employer])   2 girls: Rylee 13 and Stella 9    Both daughters are very involved in sports   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      Dog: Gpys- Double Doodle   Immunization History  Administered Date(s) Administered   Influenza Split 10/01/2008, 10/08/2012   Influenza, Seasonal, Injecte, Preservative Fre 11/06/2011, 10/12/2013, 10/09/2014, 09/11/2022   Influenza,inj,Quad PF,6+ Mos 10/18/2019   Influenza-Unspecified 09/30/2017, 09/17/2018, 10/20/2021, 10/30/2021   Meningococcal polysaccharide vaccine (MPSV4) 07/19/2011   PFIZER(Purple Top)SARS-COV-2 Vaccination 09/03/2019, 09/24/2019   Pneumococcal Conjugate-13 11/11/2017   Pneumococcal Polysaccharide-23 07/19/2011   Tdap 04/27/2010, 08/18/2013, 07/17/2023     Objective: Vital Signs: BP (!) 128/91 (BP Location: Left Arm, Patient Position: Sitting, Cuff Size: Normal)   Pulse 99   Temp 97.6 F (36.4 C)   Resp 12   Ht 5' 5 (1.651 m)   Wt 175 lb 3.2 oz (79.5 kg)   LMP 09/17/2023   BMI 29.15 kg/m    Physical Exam Vitals and nursing note reviewed.  Constitutional:      Appearance: She is well-developed.  HENT:     Head: Normocephalic and atraumatic.  Eyes:     Conjunctiva/sclera: Conjunctivae normal.   Cardiovascular:     Rate and Rhythm: Normal rate and regular rhythm.     Heart sounds: Normal heart sounds.  Pulmonary:     Effort: Pulmonary effort is normal.     Breath sounds: Normal breath sounds.  Abdominal:     General: Bowel sounds are normal.     Palpations: Abdomen is soft.  Musculoskeletal:     Cervical back: Normal range of motion.  Lymphadenopathy:     Cervical: No cervical adenopathy.  Skin:    General: Skin is warm and dry.     Capillary Refill: Capillary refill takes less than 2 seconds.  Neurological:     Mental Status: She is alert and oriented to person, place, and time.  Psychiatric:        Behavior: Behavior normal.      Musculoskeletal Exam: C-spine has slightly limited range of motion with  lateral rotation.  Trapezius muscle tension tenderness bilaterally.  Thoracolumbar scoliosis noted.   Shoulder joints, elbow joints, wrist joints, MCPs, PIPs, DIPs have good range of motion with no synovitis.  Complete fist formation bilaterally.  Hip joints have good range of motion with no groin pain.  Knee joints have good range of motion no warmth or effusion.  Ankle joints have good range of motion no tenderness or joint swelling.  No evidence of Achilles tendinitis or plantar fasciitis.   CDAI Exam: CDAI Score: -- Patient Global: --; Provider Global: -- Swollen: --; Tender: -- Joint Exam 10/01/2023   No joint exam has been documented for this visit   There is currently no information documented on the homunculus. Go to the Rheumatology activity and complete the homunculus joint exam.  Investigation: No additional findings.  Imaging: No results found.  Recent Labs: Lab Results  Component Value Date   WBC 5.8 04/30/2023   HGB 13.6 04/30/2023   PLT 303 04/30/2023   NA 139 04/30/2023   K 3.9 04/30/2023   CL 103 04/30/2023   CO2 27 04/30/2023   GLUCOSE 91 04/30/2023   BUN 5 (L) 04/30/2023   CREATININE 0.75 04/30/2023   BILITOT 0.4 04/30/2023   ALKPHOS  131 (H) 09/11/2013   AST 13 04/30/2023   ALT 10 04/30/2023   PROT 6.3 04/30/2023   ALBUMIN 2.7 (L) 09/11/2013   CALCIUM  9.1 04/30/2023   GFRAA 103 07/07/2020   QFTBGOLDPLUS NEGATIVE 03/24/2019    Speciality Comments: PLQ Eye Exam:05/29/2022 WNL Dr. Elspeth Pinal f/u 12 months  Patient has appointment on 10/29/2023   Procedures:  No procedures performed Allergies: Cefdinir , Ciprofloxacin, and Ciprofibrate      Assessment / Plan:     Visit Diagnoses: Other systemic lupus erythematosus with other organ involvement (HCC) - ANA positive, dsDNA 48, rest of the ENA negative, C3-C4 normal.  History of fatigue, hair loss, arthralgias, oral ulcers, sicca symptoms and photosensitivity:  She has not had any signs or symptoms of a systemic lupus flare.  She has clinically been doing well taking Plaquenil  200 mg 1 tablet by mouth twice daily Monday through Friday.  She is tolerating Plaquenil  without any side effects and has not had any gaps in therapy.  Her primary concern remains her level of fatigue and myalgias.  She has been taking vitamin D  2000 units daily.  Plan to check vitamin B12 today along with routine lab work. She has not had any recent rashes point.  No discoid lesions.  Discussed the importance of avoiding direct sun exposure.  No oral or nasal ulcerations.  She continues to have chronic sicca symptoms.  Discussed the use of over-the-counter products including Biotene, XyliMelts, and a humidifier for symptomatic relief.  She has been using Systane eyedrops for dry eyes.  No digital ulcerations or signs of sclerodactyly noted. Lab work from 04/30/23 was reviewed today in the office: ANA 1:40NS, complements WNL, ESR 2, dsDNA 9-stable. Plan to update the following lab work today.   She will remain on Plaquenil  as prescribed.  She was advised to notify us  if she develops any signs or symptoms of a flare.  She will follow-up in the office in 5 months or sooner if needed. - Plan: Protein /  creatinine ratio, urine, CBC with Differential/Platelet, Anti-DNA antibody, double-stranded, C3 and C4, Sedimentation rate, Comprehensive metabolic panel with GFR  High risk medication use - Plaquenil  200 mg 1 tablet by mouth twice daily M-F.  CBC and CMP updated on  04/30/23.  Orders for CBC and CMP released today. PLQ Eye Exam:05/29/2022 WNL Dr. Elspeth Pinal f/u 12 months.  Scheduled for Plaquenil  examination on 10/29/2023. 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  No discoid lesions noted.  Discussed importance of avoiding direct sun exposure.  She will remain on Plaquenil  as prescribed.  Other fatigue: Patient continues to have chronic fatigue on a daily basis.  Plan to obtain the following lab work today for further evaluation.  Plan to check vitamin B12. Plan: Vitamin B12  Trapezius muscle spasm: She has trapezius muscle tension and tenderness bilaterally.  Other form of scoliosis of lumbar spine: Chronic pain.  She has been seeing a chiropractor once a month and has been going for monthly massages.  She has a prescription for methocarbamol  500 mg 1 tablet daily as needed for muscle spasms.   Vitamin D  deficiency: She is taking vitamin D  2000 units daily.  Vitamin B12 deficiency -She continues to have chronic fatigue on a daily basis.  Plan to check vitamin B12 today.  Plan: Vitamin B12  Other medical conditions are listed as follows:  History of IBS  History of gastroesophageal reflux (GERD)  Hiatal hernia  History of anxiety   Orders: Orders Placed This Encounter  Procedures   Protein / creatinine ratio, urine   CBC with Differential/Platelet   Anti-DNA antibody, double-stranded   C3 and C4   Sedimentation rate   Comprehensive metabolic panel with GFR   Vitamin B12   No orders of the defined types were placed in this encounter.    Follow-Up Instructions: Return in about 5 months (around  03/02/2024) for Systemic lupus erythematosus.   Waddell CHRISTELLA Craze, PA-C  Note - This record has been created using Dragon software.  Chart creation errors have been sought, but may not always  have been located. Such creation errors do not reflect on  the standard of medical care.

## 2023-09-17 NOTE — Telephone Encounter (Signed)
 Patient called the office back to advise that she has a appointment on 10/29/2023 for a PLQ Eye exam.

## 2023-10-01 ENCOUNTER — Ambulatory Visit: Attending: Physician Assistant | Admitting: Physician Assistant

## 2023-10-01 ENCOUNTER — Encounter: Payer: Self-pay | Admitting: Physician Assistant

## 2023-10-01 VITALS — BP 128/91 | HR 99 | Temp 97.6°F | Resp 12 | Ht 65.0 in | Wt 175.2 lb

## 2023-10-01 DIAGNOSIS — E538 Deficiency of other specified B group vitamins: Secondary | ICD-10-CM

## 2023-10-01 DIAGNOSIS — Z79899 Other long term (current) drug therapy: Secondary | ICD-10-CM

## 2023-10-01 DIAGNOSIS — M4186 Other forms of scoliosis, lumbar region: Secondary | ICD-10-CM

## 2023-10-01 DIAGNOSIS — Z8719 Personal history of other diseases of the digestive system: Secondary | ICD-10-CM

## 2023-10-01 DIAGNOSIS — R5383 Other fatigue: Secondary | ICD-10-CM | POA: Diagnosis not present

## 2023-10-01 DIAGNOSIS — M62838 Other muscle spasm: Secondary | ICD-10-CM

## 2023-10-01 DIAGNOSIS — M3219 Other organ or system involvement in systemic lupus erythematosus: Secondary | ICD-10-CM | POA: Diagnosis not present

## 2023-10-01 DIAGNOSIS — L930 Discoid lupus erythematosus: Secondary | ICD-10-CM

## 2023-10-01 DIAGNOSIS — K449 Diaphragmatic hernia without obstruction or gangrene: Secondary | ICD-10-CM

## 2023-10-01 DIAGNOSIS — E559 Vitamin D deficiency, unspecified: Secondary | ICD-10-CM

## 2023-10-01 DIAGNOSIS — Z8659 Personal history of other mental and behavioral disorders: Secondary | ICD-10-CM

## 2023-10-02 ENCOUNTER — Other Ambulatory Visit: Payer: Self-pay | Admitting: Physician Assistant

## 2023-10-02 LAB — SEDIMENTATION RATE: Sed Rate: 2 mm/h (ref 0–20)

## 2023-10-02 LAB — COMPREHENSIVE METABOLIC PANEL WITH GFR
AG Ratio: 1.6 (calc) (ref 1.0–2.5)
ALT: 12 U/L (ref 6–29)
AST: 14 U/L (ref 10–30)
Albumin: 4.1 g/dL (ref 3.6–5.1)
Alkaline phosphatase (APISO): 58 U/L (ref 31–125)
BUN/Creatinine Ratio: 6 (calc) (ref 6–22)
BUN: 5 mg/dL — ABNORMAL LOW (ref 7–25)
CO2: 29 mmol/L (ref 20–32)
Calcium: 9.4 mg/dL (ref 8.6–10.2)
Chloride: 100 mmol/L (ref 98–110)
Creat: 0.79 mg/dL (ref 0.50–0.99)
Globulin: 2.5 g/dL (ref 1.9–3.7)
Glucose, Bld: 94 mg/dL (ref 65–99)
Potassium: 4.2 mmol/L (ref 3.5–5.3)
Sodium: 137 mmol/L (ref 135–146)
Total Bilirubin: 0.4 mg/dL (ref 0.2–1.2)
Total Protein: 6.6 g/dL (ref 6.1–8.1)
eGFR: 95 mL/min/1.73m2 (ref >=60)

## 2023-10-02 LAB — CBC WITH DIFFERENTIAL/PLATELET
Absolute Lymphocytes: 1615 {cells}/uL (ref 850–3900)
Absolute Monocytes: 460 {cells}/uL (ref 200–950)
Basophils Absolute: 40 {cells}/uL (ref 0–200)
Basophils Relative: 0.8 %
Eosinophils Absolute: 270 {cells}/uL (ref 15–500)
Eosinophils Relative: 5.4 %
HCT: 42.7 % (ref 35.0–45.0)
Hemoglobin: 13.7 g/dL (ref 11.7–15.5)
MCH: 28.7 pg (ref 27.0–33.0)
MCHC: 32.1 g/dL (ref 32.0–36.0)
MCV: 89.5 fL (ref 80.0–100.0)
MPV: 10.2 fL (ref 7.5–12.5)
Monocytes Relative: 9.2 %
Neutro Abs: 2615 {cells}/uL (ref 1500–7800)
Neutrophils Relative %: 52.3 %
Platelets: 299 Thousand/uL (ref 140–400)
RBC: 4.77 Million/uL (ref 3.80–5.10)
RDW: 12.4 % (ref 11.0–15.0)
Total Lymphocyte: 32.3 %
WBC: 5 Thousand/uL (ref 3.8–10.8)

## 2023-10-02 LAB — PROTEIN / CREATININE RATIO, URINE
Creatinine, Urine: 132 mg/dL (ref 20–275)
Protein/Creat Ratio: 53 mg/g{creat} (ref 24–184)
Protein/Creatinine Ratio: 0.053 mg/mg{creat} (ref 0.024–0.184)
Total Protein, Urine: 7 mg/dL (ref 5–24)

## 2023-10-02 LAB — ANTI-DNA ANTIBODY, DOUBLE-STRANDED: ds DNA Ab: 13 [IU]/mL — ABNORMAL HIGH

## 2023-10-02 LAB — VITAMIN B12: Vitamin B-12: 412 pg/mL (ref 200–1100)

## 2023-10-02 LAB — C3 AND C4
C3 Complement: 109 mg/dL (ref 83–193)
C4 Complement: 33 mg/dL (ref 15–57)

## 2023-10-02 NOTE — Telephone Encounter (Signed)
 Pt requesting refill for Vyvanse  40 mg. Last OV 07/18/2023. Pt scheduled 10/21/2023 for next visit.

## 2023-10-03 ENCOUNTER — Ambulatory Visit: Payer: Self-pay | Admitting: Rheumatology

## 2023-10-03 NOTE — Progress Notes (Signed)
 CBC and CMP are normal.  Urine protein creatinine ratio normal.  Double-stranded DNA remains positive.  Complements normal, sed rate normal, B12 normal.  Labs do not indicate an autoimmune disease flare.

## 2023-10-17 ENCOUNTER — Ambulatory Visit: Admitting: Physician Assistant

## 2023-10-21 ENCOUNTER — Ambulatory Visit: Admitting: Physician Assistant

## 2023-10-21 VITALS — BP 118/80 | HR 76 | Wt 179.0 lb

## 2023-10-21 DIAGNOSIS — N951 Menopausal and female climacteric states: Secondary | ICD-10-CM

## 2023-10-21 DIAGNOSIS — R41840 Attention and concentration deficit: Secondary | ICD-10-CM | POA: Diagnosis not present

## 2023-10-21 DIAGNOSIS — M328 Other forms of systemic lupus erythematosus: Secondary | ICD-10-CM

## 2023-10-21 DIAGNOSIS — Z23 Encounter for immunization: Secondary | ICD-10-CM

## 2023-10-21 DIAGNOSIS — F419 Anxiety disorder, unspecified: Secondary | ICD-10-CM

## 2023-10-21 DIAGNOSIS — F3342 Major depressive disorder, recurrent, in full remission: Secondary | ICD-10-CM | POA: Diagnosis not present

## 2023-10-21 MED ORDER — LISDEXAMFETAMINE DIMESYLATE 50 MG PO CAPS: 50.0000 mg | ORAL_CAPSULE | Freq: Every day | ORAL | 0 refills | Status: DC

## 2023-10-21 MED ORDER — GABAPENTIN 100 MG PO CAPS: 100.0000 mg | ORAL_CAPSULE | Freq: Every day | ORAL | 1 refills | Status: DC

## 2023-10-21 NOTE — Progress Notes (Signed)
 Emma Franco is a 43 y.o. female here for a follow up of a pre-existing problem.  History of Present Illness:   Chief Complaint  Patient presents with   Medication Management   Discussed the use of AI scribe software for clinical note transcription with the patient, who gave verbal consent to proceed.  History of Present Illness   Emma Franco is a 43 year old female with lupus who presents with concerns about attention and focus, and night sweats associated with perimenopause.  She experiences ongoing issues with attention and focus, particularly a decline in the early afternoon described as a 'crash'. She feels her current medication, Vyvanse  40 mg daily, may need adjustment as it is not adequately addressing these symptoms.  She experiences significant night sweats, occurring four to five days before her period and lasting for about seven to eight days, causing her to wake up drenched multiple times a night. Her menstrual cycle remains regular but shorter in duration. She has not tried [employer] Wort due to concerns about interactions with her current medications.  She has lupus with recent lab results showing an increase in double-stranded DNA levels from 9 to 13. She is currently on Plaquenil . Her current medications include Vyvanse , Wellbutrin , and Lexapro , with no issues reported and no immediate need for refills.        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   Asthma    Chronic sinusitis 05/21/2017   s/p surgery, also complicated by CSF leak, which was repaired    Environmental allergies    Esophageal reflux    ESOPHAGITIS 03/18/2008   Qualifier: Diagnosis of  By: Genie CMA (AAMA), Chick     Family history of rheumatoid arthritis 05/21/2017   Mother   Headache(784.0) 07/18/2011   qd for the last year; CSF leak; repaired today   Hiatal hernia    History of miscarriage 06/26/2017   In first trimester    Irritable bowel syndrome    Lupus    Migraines    once in a blue moon   Scoliosis    mild   Sinusitis, chronic      Social History   Tobacco Use   Smoking status: Never    Passive exposure: Past   Smokeless tobacco: Never  Vaping Use   Vaping status: Never Used  Substance Use Topics   Alcohol use: Yes    Comment: social   Drug use: No    Past Surgical History:  Procedure Laterality Date   CESAREAN SECTION  03/2009   CESAREAN SECTION Bilateral 10/16/2013   Procedure: REPEAT CESAREAN SECTION WITH BILATERAL TUBAL LIGATION ;  Surgeon: Burnard A. Kandyce, MD;  Location: [facility];  Service: Obstetrics;  Laterality: Bilateral;   EDD: 10/26/13   CHOLECYSTECTOMY  12/1996   NASAL SEPTUM SURGERY  2006   NASAL SINUS SURGERY  03/2010; 07/18/11   REPAIR DURAL / CSF LEAK  07/18/2011   SINUS ENDO W/FUSION  07/18/2011   Procedure: ENDOSCOPIC SINUS SURGERY WITH FUSION NAVIGATION;  Surgeon: Merilee Kraft, MD;  Location: [facility];  Service: ENT;  Laterality: N/A;   TONSILLECTOMY  1990   TUBAL LIGATION      Family History  Problem Relation Age of Onset   Rheum arthritis Mother    Multiple sclerosis Mother    Breast cancer Mother    Arthritis Mother    Hearing loss  Mother    Hearing loss Father    Stroke Maternal Grandmother    Stroke Paternal Grandmother     Allergies  Allergen Reactions   Cefdinir  Swelling   Ciprofloxacin Nausea And Vomiting   Ciprofibrate Nausea And Vomiting    Current Medications:   Current Outpatient Medications:    buPROPion  (WELLBUTRIN  SR) 200 MG 12 hr tablet, Take 1 tablet (200 mg total) by mouth 2 (two) times daily., Disp: 60 tablet, Rfl: 5   cetirizine (ZYRTEC) 10 MG tablet, Take 10 mg by mouth daily., Disp: , Rfl:    Cholecalciferol (VITAMIN D ) 50 MCG (2000 UT) CAPS, Take 1 capsule by mouth daily in the afternoon., Disp: , Rfl:    escitalopram  (LEXAPRO ) 20 MG tablet, Take 1 tablet (20 mg total) by mouth daily., Disp: 30 tablet, Rfl: 5   fluticasone   (FLONASE ) 50 MCG/ACT nasal spray, Place 1 spray into both nostrils as needed., Disp: 18.2 mL, Rfl: 11   hydroxychloroquine  (PLAQUENIL ) 200 MG tablet, TAKE 1 TABLET TWICE DAILY MONDAY THROUGH FRIDAY., Disp: 40 tablet, Rfl: 0   lisdexamfetamine (VYVANSE ) 40 MG capsule, TAKE ONE CAPSULE BY MOUTH EVERY MORNING, Disp: 30 capsule, Rfl: 0   methocarbamol  (ROBAXIN ) 500 MG tablet, Take 1 tablet (500 mg total) by mouth daily as needed., Disp: 30 tablet, Rfl: 1   Review of Systems:   Negative unless otherwise specified per HPI.  Vitals:   Vitals:   10/21/23 0815  BP: 118/80  Pulse: 76  SpO2: 99%  Weight: 179 lb (81.2 kg)     Body mass index is 29.79 kg/m.  Physical Exam:   Physical Exam Constitutional:      Appearance: Normal appearance. She is well-developed.  HENT:     Head: Normocephalic and atraumatic.  Eyes:     General: Lids are normal.     Extraocular Movements: Extraocular movements intact.     Conjunctiva/sclera: Conjunctivae normal.  Pulmonary:     Effort: Pulmonary effort is normal.  Musculoskeletal:        General: Normal range of motion.     Cervical back: Normal range of motion and neck supple.  Skin:    General: Skin is warm and dry.  Neurological:     Mental Status: She is alert and oriented to person, place, and time.  Psychiatric:        Attention and Perception: Attention and perception normal.        Mood and Affect: Mood normal.        Behavior: Behavior normal.        Thought Content: Thought content normal.        Judgment: Judgment normal.     Assessment and Plan:   Assessment and Plan    Attention and concentration deficit Persistent attention and focus issues, especially in the afternoon. Current Vyvanse  regimen is well-tolerated. She is ready to increase dosage to improve symptoms. -Increase Vyvanse  to 50 mg daily -PDMP reviewed during this encounter. -follow up in 3 month(s), sooner if concerns  Perimenopausal vasomotor symptoms (night  sweats) Severe night sweats disrupt sleep, particularly before menstruation. Gabapentin considered for treatment, with hormone replacement therapy as a future option if needed. - Prescribe gabapentin 100 mg at night during 4-5 days before menstruation and during. - Monitor effectiveness of gabapentin on night sweats. - Consider hormone replacement therapy if gabapentin is ineffective. - Provide menopause map for tracking symptoms and periods.  Recurrent major depressive disorder, full remission; Anxiety Well-managed on Wellbutrin  and Lexapro . No immediate  medication adjustments needed. - Recommend avoiding [employer] as this can increase risk of serotonin syndrome in combination with these medications  Other forms of systemic lupus erythematosus, unspecified organ involvement status (HCC)  Double-stranded DNA levels increased but not significant. Current Plaquenil  dosage remains appropriate as per rheumatologist. - Continue current Plaquenil  dosage. - Monitor double-stranded DNA levels as per rheumatologist's guidance.     Lucie Buttner, PA-C

## 2023-12-09 ENCOUNTER — Other Ambulatory Visit: Payer: Self-pay | Admitting: Physician Assistant

## 2023-12-09 NOTE — Telephone Encounter (Signed)
 Last Fill: 04/30/2023  Next Visit: 03/03/2024  Last Visit: 10/01/2023  DX: Other form of scoliosis of lumbar spine  Current Dose per office note on 10/01/2023: methocarbamol  500 mg 1 tablet daily as needed for muscle spasms.   Okay to refill robaxin ?

## 2023-12-19 ENCOUNTER — Encounter: Payer: Self-pay | Admitting: Physician Assistant

## 2023-12-19 ENCOUNTER — Ambulatory Visit: Admitting: Physician Assistant

## 2023-12-19 VITALS — BP 126/80 | HR 84 | Temp 97.0°F | Ht 65.0 in | Wt 175.0 lb

## 2023-12-19 DIAGNOSIS — R0981 Nasal congestion: Secondary | ICD-10-CM | POA: Diagnosis not present

## 2023-12-19 DIAGNOSIS — H8113 Benign paroxysmal vertigo, bilateral: Secondary | ICD-10-CM

## 2023-12-19 DIAGNOSIS — N951 Menopausal and female climacteric states: Secondary | ICD-10-CM | POA: Diagnosis not present

## 2023-12-19 MED ORDER — DIAZEPAM 2 MG PO TABS: 2.0000 mg | ORAL_TABLET | Freq: Two times a day (BID) | ORAL | 0 refills | Status: AC | PRN

## 2023-12-19 MED ORDER — GABAPENTIN 100 MG PO CAPS: ORAL_CAPSULE | ORAL | 1 refills | Status: AC

## 2023-12-19 MED ORDER — LISDEXAMFETAMINE DIMESYLATE 60 MG PO CAPS: 60.0000 mg | ORAL_CAPSULE | ORAL | 0 refills | Status: AC

## 2023-12-19 MED ORDER — DOXYCYCLINE HYCLATE 100 MG PO TABS: 100.0000 mg | ORAL_TABLET | Freq: Two times a day (BID) | ORAL | 0 refills | Status: AC

## 2023-12-19 MED ORDER — AZELASTINE HCL 0.1 % NA SOLN: 1.0000 | Freq: Two times a day (BID) | NASAL | 1 refills | Status: AC

## 2023-12-19 NOTE — Progress Notes (Signed)
 History of Present Illness:   Chief Complaint  Patient presents with   Dizziness    Pt c/o Vertigo since Saturday morning.   Sinus Problem    Pt c/o sinus headache, pressure, facial pain started on Monday, bloody nasal drainage yesterday, denies fever or chills.    Discussed the use of AI scribe software for clinical note transcription with the patient, who gave verbal consent to proceed.  History of Present Illness   Emma Franco is a 43 year old female who presents with recurrent vertigo and sinus infection symptoms.  She has had vertigo since Saturday, described as feeling like being on a boat, triggered by turning on her right side in bed or bending over and looking down. She typically has vertigo three to four times a year, and this episode is milder than prior episodes that caused vomiting and inability to move. Meclizine  has not helped in the past.  Vertigo often occurs with sinus issues. This episode began two days before sinus symptoms. Since Monday she has had congestion, forehead pain, dull headache, clear nasal drainage, and occasional blood when blowing her nose. She denies fever, sneezing, and cough. She takes an antihistamine nightly and feels it has reduced sinus infections.  She has dizziness with occasional nausea, sometimes needing to hold onto something and close her eyes until symptoms pass. She notes intermittent crackling in her ears with swallowing. She denies blurred vision or severe sore throat.  Current medications include nightly antihistamine, gabapentin  for night sweats with partial benefit, Vyvanse  50 mg, and prior meclizine  without relief for vertigo.     She would like to trial an increase of Vyvanse  to 60 mg daily to help with Attention Deficit Hyperactivity Disorder (ADHD) symptom(s).   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   Asthma    Chronic sinusitis 05/21/2017   s/p surgery, also  complicated by CSF leak, which was repaired    Environmental allergies    Esophageal reflux    ESOPHAGITIS 03/18/2008   Qualifier: Diagnosis of  By: Genie CMA (AAMA), Chick     Family history of rheumatoid arthritis 05/21/2017   Mother   Headache(784.0) 07/18/2011   qd for the last year; CSF leak; repaired today   Hiatal hernia    History of miscarriage 06/26/2017   In first trimester   Irritable bowel syndrome    Lupus    Migraines    once in a blue moon   Scoliosis    mild   Sinusitis, chronic      Social History[1]  Past Surgical History:  Procedure Laterality Date   CESAREAN SECTION  03/2009   CESAREAN SECTION Bilateral 10/16/2013   Procedure: REPEAT CESAREAN SECTION WITH BILATERAL TUBAL LIGATION ;  Surgeon: Burnard A. Kandyce, MD;  Location: [facility];  Service: Obstetrics;  Laterality: Bilateral;   EDD: 10/26/13   CHOLECYSTECTOMY  12/1996   NASAL SEPTUM SURGERY  2006   NASAL SINUS SURGERY  03/2010; 07/18/11   REPAIR DURAL / CSF LEAK  07/18/2011   SINUS ENDO W/FUSION  07/18/2011   Procedure: ENDOSCOPIC SINUS SURGERY WITH FUSION NAVIGATION;  Surgeon: Merilee Kraft, MD;  Location: [facility] OR;  Service: ENT;  Laterality: N/A;   TONSILLECTOMY  1990   TUBAL LIGATION      Family History  Problem Relation Age of Onset   Rheum arthritis Mother    Multiple sclerosis Mother  Breast cancer Mother    Arthritis Mother    Hearing loss Mother    Hearing loss Father    Stroke Maternal Grandmother    Stroke Paternal Grandmother     Allergies[2]  Current Medications:  Current Medications[3]   Review of Systems:   Negative unless otherwise specified per HPI.  Vitals:   Vitals:   12/19/23 0817  BP: 126/80  Pulse: 84  Temp: (!) 97 F (36.1 C)  TempSrc: Temporal  SpO2: 99%  Weight: 175 lb (79.4 kg)  Height: 5' 5 (1.651 m)     Body mass index is 29.12 kg/m.  Physical Exam:   Physical Exam Vitals and nursing note reviewed.  Constitutional:      General: She  is not in acute distress.    Appearance: She is well-developed. She is not ill-appearing or toxic-appearing.  HENT:     Head: Normocephalic and atraumatic.     Right Ear: Ear canal and external ear normal. A middle ear effusion is present. Tympanic membrane is not erythematous, retracted or bulging.     Left Ear: Ear canal and external ear normal. A middle ear effusion is present. Tympanic membrane is not erythematous, retracted or bulging.     Nose: Nose normal.     Right Sinus: No maxillary sinus tenderness or frontal sinus tenderness.     Left Sinus: No maxillary sinus tenderness or frontal sinus tenderness.     Mouth/Throat:     Pharynx: Uvula midline. No posterior oropharyngeal erythema.  Eyes:     General: Lids are normal.     Conjunctiva/sclera: Conjunctivae normal.  Neck:     Trachea: Trachea normal.  Cardiovascular:     Rate and Rhythm: Normal rate and regular rhythm.     Heart sounds: Normal heart sounds, S1 normal and S2 normal.  Pulmonary:     Effort: Pulmonary effort is normal.     Breath sounds: Normal breath sounds. No decreased breath sounds, wheezing, rhonchi or rales.  Lymphadenopathy:     Cervical: No cervical adenopathy.  Skin:    General: Skin is warm and dry.  Neurological:     Mental Status: She is alert.  Psychiatric:        Speech: Speech normal.        Behavior: Behavior normal. Behavior is cooperative.     Assessment and Plan:   Assessment and Plan    Benign paroxysmal positional vertigo Recurrent vertigo episodes 3-4 times a year with dizziness and nausea. Previous treatments ineffective. Valium discussed for temporary relief. Physical therapy recommended for long-term management. - Referred to physical therapy for vertigo management exercises. - Prescribed Valium 2 mg for symptomatic relief. Sleepy/driving precautions advised.  Acute sinusitis Nasal congestion, forehead pain, clear drainage affecting daily activities. Doxycycline  prescribed as  a precaution against bacterial infection. Nasal sprays discussed for inflammation and congestion. - Prescribed doxycycline  for potential bacterial infection if symptom(s) do not improve in a few days. - Prescribed Flonase  for nasal inflammation and congestion. - Instructed on nasal spray use with saline rinse.  Perimenopausal vasomotor symptoms Night sweats 3-5 days before menstruation. Gabapentin  previously effective. Dose increase discussed for symptom management. - Increased gabapentin  to 200 mg for night sweats.  Attention-deficit hyperactivity disorder Currently on Vyvanse  50 mg. Discussed dosage increase but maintained current dose due to upcoming refill. - Continue Vyvanse  50 mg.  - PDMP reviewed during this encounter.    Follow up in 3 month(s), sooner if concerns  Lucie Buttner, PA-C    [  1]  Social History Tobacco Use   Smoking status: Never    Passive exposure: Past   Smokeless tobacco: Never  Vaping Use   Vaping status: Never Used  Substance Use Topics   Alcohol use: Yes    Comment: social   Drug use: No  [2]  Allergies Allergen Reactions   Cefdinir  Swelling   Ciprofloxacin Nausea And Vomiting   Ciprofibrate Nausea And Vomiting  [3]  Current Outpatient Medications:    azelastine (ASTELIN) 0.1 % nasal spray, Place 1 spray into both nostrils 2 (two) times daily. Use in each nostril as directed, Disp: 30 mL, Rfl: 1   buPROPion  (WELLBUTRIN  SR) 200 MG 12 hr tablet, Take 1 tablet (200 mg total) by mouth 2 (two) times daily., Disp: 60 tablet, Rfl: 5   cetirizine (ZYRTEC) 10 MG tablet, Take 10 mg by mouth daily., Disp: , Rfl:    Cholecalciferol (VITAMIN D ) 50 MCG (2000 UT) CAPS, Take 1 capsule by mouth daily in the afternoon., Disp: , Rfl:    diazepam (VALIUM) 2 MG tablet, Take 1 tablet (2 mg total) by mouth every 12 (twelve) hours as needed (vertigo)., Disp: 6 tablet, Rfl: 0   doxycycline  (VIBRA -TABS) 100 MG tablet, Take 1 tablet (100 mg total) by mouth 2 (two) times  daily., Disp: 14 tablet, Rfl: 0   escitalopram  (LEXAPRO ) 20 MG tablet, Take 1 tablet (20 mg total) by mouth daily., Disp: 30 tablet, Rfl: 5   fluticasone  (FLONASE ) 50 MCG/ACT nasal spray, Place 1 spray into both nostrils as needed., Disp: 18.2 mL, Rfl: 11   hydroxychloroquine  (PLAQUENIL ) 200 MG tablet, TAKE 1 TABLET TWICE DAILY MONDAY THROUGH FRIDAY., Disp: 40 tablet, Rfl: 0   lisdexamfetamine (VYVANSE ) 60 MG capsule, Take 1 capsule (60 mg total) by mouth every morning., Disp: 30 capsule, Rfl: 0   [START ON 01/18/2024] lisdexamfetamine (VYVANSE ) 60 MG capsule, Take 1 capsule (60 mg total) by mouth every morning., Disp: 30 capsule, Rfl: 0   [START ON 02/17/2024] lisdexamfetamine (VYVANSE ) 60 MG capsule, Take 1 capsule (60 mg total) by mouth every morning., Disp: 30 capsule, Rfl: 0   methocarbamol  (ROBAXIN ) 500 MG tablet, Take 1 tablet (500 mg total) by mouth daily as needed., Disp: 30 tablet, Rfl: 1   gabapentin  (NEURONTIN ) 100 MG capsule, Take 200 mg nightly 3-5 days prior to onset of menses, Disp: 60 capsule, Rfl: 1

## 2023-12-23 ENCOUNTER — Ambulatory Visit

## 2023-12-23 ENCOUNTER — Other Ambulatory Visit: Payer: Self-pay | Admitting: Physician Assistant

## 2023-12-23 DIAGNOSIS — M359 Systemic involvement of connective tissue, unspecified: Secondary | ICD-10-CM

## 2023-12-23 DIAGNOSIS — M3219 Other organ or system involvement in systemic lupus erythematosus: Secondary | ICD-10-CM

## 2023-12-23 NOTE — Telephone Encounter (Signed)
 Last Fill: 09/17/2023  Eye exam: 10/29/2023 WNL   Labs: 10/01/2023 CBC and CMP are normal.   Next Visit: 03/03/2024  Last Visit: 10/01/2023  DX: Other systemic lupus erythematosus with other organ involvement    Current Dose per office note 10/01/2023:  Plaquenil  200 mg 1 tablet by mouth twice daily M-F   Okay to refill Plaquenil ?

## 2024-01-21 ENCOUNTER — Ambulatory Visit: Admitting: Physician Assistant

## 2024-03-03 ENCOUNTER — Ambulatory Visit: Admitting: Physician Assistant

## 2024-03-19 ENCOUNTER — Ambulatory Visit: Admitting: Physician Assistant
